# Patient Record
Sex: Male | Born: 1975 | Race: White | Hispanic: No | Smoking: Never smoker
Health system: Southern US, Community
[De-identification: ages and names within clinical notes are randomized; demographics above are authoritative.]

## PROBLEM LIST (undated history)

## (undated) DIAGNOSIS — G8929 Other chronic pain: Secondary | ICD-10-CM

## (undated) DIAGNOSIS — L97509 Non-pressure chronic ulcer of other part of unspecified foot with unspecified severity: Secondary | ICD-10-CM

## (undated) DIAGNOSIS — M549 Dorsalgia, unspecified: Secondary | ICD-10-CM

## (undated) DIAGNOSIS — M5416 Radiculopathy, lumbar region: Secondary | ICD-10-CM

## (undated) DIAGNOSIS — F32A Depression, unspecified: Secondary | ICD-10-CM

## (undated) DIAGNOSIS — I10 Essential (primary) hypertension: Secondary | ICD-10-CM

## (undated) DIAGNOSIS — M542 Cervicalgia: Secondary | ICD-10-CM

## (undated) DIAGNOSIS — E11621 Type 2 diabetes mellitus with foot ulcer: Secondary | ICD-10-CM

## (undated) DIAGNOSIS — Z765 Malingerer [conscious simulation]: Secondary | ICD-10-CM

## (undated) DIAGNOSIS — L03211 Cellulitis of face: Secondary | ICD-10-CM

## (undated) DIAGNOSIS — N529 Male erectile dysfunction, unspecified: Secondary | ICD-10-CM

## (undated) DIAGNOSIS — E785 Hyperlipidemia, unspecified: Secondary | ICD-10-CM

## (undated) DIAGNOSIS — F329 Major depressive disorder, single episode, unspecified: Secondary | ICD-10-CM

## (undated) DIAGNOSIS — F419 Anxiety disorder, unspecified: Secondary | ICD-10-CM

## (undated) DIAGNOSIS — K219 Gastro-esophageal reflux disease without esophagitis: Secondary | ICD-10-CM

## (undated) DIAGNOSIS — M5412 Radiculopathy, cervical region: Secondary | ICD-10-CM

## (undated) HISTORY — PX: KNEE SURGERY: SHX244

## (undated) HISTORY — PX: TONSILLECTOMY: SUR1361

## (undated) HISTORY — DX: Essential (primary) hypertension: I10

## (undated) HISTORY — DX: Major depressive disorder, single episode, unspecified: F32.9

## (undated) HISTORY — DX: Depression, unspecified: F32.A

## (undated) HISTORY — DX: Gastro-esophageal reflux disease without esophagitis: K21.9

## (undated) HISTORY — PX: BACK SURGERY: SHX140

## (undated) HISTORY — DX: Anxiety disorder, unspecified: F41.9

## (undated) HISTORY — DX: Hyperlipidemia, unspecified: E78.5

## (undated) HISTORY — DX: Male erectile dysfunction, unspecified: N52.9

## (undated) HISTORY — PX: CHOLECYSTECTOMY OPEN: SUR202

## (undated) HISTORY — DX: Other chronic pain: G89.29

---

## 2003-09-19 ENCOUNTER — Inpatient Hospital Stay (HOSPITAL_COMMUNITY): Admission: EM | Admit: 2003-09-19 | Discharge: 2003-09-23 | Payer: Self-pay | Admitting: Emergency Medicine

## 2007-03-24 ENCOUNTER — Emergency Department (HOSPITAL_COMMUNITY): Admission: EM | Admit: 2007-03-24 | Discharge: 2007-03-24 | Payer: Self-pay | Admitting: Emergency Medicine

## 2007-05-12 ENCOUNTER — Ambulatory Visit (HOSPITAL_COMMUNITY): Admission: RE | Admit: 2007-05-12 | Discharge: 2007-05-12 | Payer: Self-pay | Admitting: Orthopaedic Surgery

## 2007-06-25 HISTORY — PX: CHOLECYSTECTOMY: SHX55

## 2007-12-01 ENCOUNTER — Emergency Department (HOSPITAL_COMMUNITY): Admission: EM | Admit: 2007-12-01 | Discharge: 2007-12-01 | Payer: Self-pay | Admitting: Emergency Medicine

## 2008-03-27 ENCOUNTER — Emergency Department (HOSPITAL_COMMUNITY): Admission: EM | Admit: 2008-03-27 | Discharge: 2008-03-27 | Payer: Self-pay | Admitting: Emergency Medicine

## 2008-05-10 ENCOUNTER — Inpatient Hospital Stay (HOSPITAL_COMMUNITY): Admission: RE | Admit: 2008-05-10 | Discharge: 2008-05-12 | Payer: Self-pay | Admitting: Neurosurgery

## 2010-11-06 NOTE — Op Note (Signed)
NAMECARSIN, RANDAZZO NO.:  [id number]   MEDICAL RECORD NO.:  [id number]          PATIENT TYPE:  INP   LOCATION:  3004                         FACILITY:  [facility]   PHYSICIAN:  Payton Doughty, M.D.      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   PROCEDURE:  L3-L4 anterolateral interbody lumbar fusion with lateral  plate.   SURGEON:  Payton Doughty, MD   ANESTHESIA:  General endotracheal.   PREPARATION:  Prepped and draped with alcohol wipe.   COMPLICATIONS:  None.   NURSE ASSISTANT:  Dr. Basilia Jumbo.   DOCTOR'S ASSISTANT:  Hilda Lias, MD   BODY OF TEXT:  A 35 year old gentleman with severe spondylosis and  scoliosis at L3-L4, taken to operating room, smoothly anesthetized,  intubated, and placed laterally, right side down and left side up on the  operating table and taped securely.  Fluoroscopy was used to confirm  good location of the spine in the operative field.  Following shave,  prep, and drape in usual sterile fashion, 2 lateral incisions were made  after localization over the L3-L4 disk space, one was posterolateral and  one was straight lateral.  Working through the posterolateral incision,  the retroperitoneal space was accessed and digitally probed.  Working  straight laterally through the other incision, the fascia was breached  and the dilator probe was introduced.  Under fluoroscopic guidance, it  was guided down to the disk space using the neuromonitoring.  When the  neuromonitoring revealed that they were close to part of the lumbar  plexus, the route was slightly changed so that neuromonitoring revealed  safe passage to the iliopsoas muscle.  Following good docking with this,  a K-wire was tapped into the disk space and  fluoroscopically  demonstrated to be in good position.  Successive dilation under  neuromonitoring was carried out.  The retractor was then placed and  under neuromonitoring opened.  Under fluoroscopic guidance, the same was  tapped into the disk space.  The blades were further opened and the disk  uncovered.  Diskectomy was carried out using the blade, curettes, Cobb,  periosteal elevators, and Kerrison instrumentation.  Following complete  diskectomy, 8 mm and 10 mm x 18 x 55 mm trials were used and 10 was  decided to be the correct level.  The 10-mm implant was packed with BMP  product and tapped into place.  Fluoroscopic guidance showed good  placement.  The 60-mm screws were then placed in each  endplate and the lateral plate placed over those and tightened down with  the locking nuts.  Final film showed good placement of hardware.  The  two incisions were closed with successive layers of 2-0 Vicryl and 3-0  nylon.  Betadine and Telfa dressings were applied.  The patient returned  to the recovery room in good condition.  ______________________________  Payton Doughty, M.D.     MWR/MEDQ  D:  05/10/2008  T:  05/10/2008  Job:  [id number]

## 2010-11-06 NOTE — H&P (Signed)
NAMELEXUS, BARLETTA NO.:  [id number]   MEDICAL RECORD NO.:  [id number]          PATIENT TYPE:  INP   LOCATION:  NA                           FACILITY:  [facility]   PHYSICIAN:  Payton Doughty, M.D.      DATE OF BIRTH:  12/31/1975   DATE OF ADMISSION:  05/10/2008  DATE OF DISCHARGE:                              HISTORY & PHYSICAL   DIAGNOSIS:  Spondylosis at L3-4.   BODY OF THE TEXT:  This is a 35 year old right-handed white gentleman  who is followed in our office for about a year, had pain in his low back  pain, had been getting worse; and also right knee pain.  MRI of his  lumbar spine shows spondylosis with scoliosis centered at L3-4, it is  concave to the right.  He is admitted now for anterior lumbar inner body  fusion done from the lateral approach, i.e. an XLIF.   MEDICAL HISTORY:  Remarkable for hypertension and diabetes.   SURGICAL HISTORY:  Tonsillectomy and __________ removed from the right  leg in 2004.   ALLERGIES:  He has no allergies.   MEDICATIONS:  Paroxetine, lisinopril, aspirin, glipizide, metformin, and  Endocet.   SOCIAL HISTORY:  He is married.  Does not smoke.  Drinks socially.  He  is employed as a Arts development officer.   FAMILY HISTORY:  Noncontributory.   REVIEW OF SYSTEMS:  Remarkable for back and leg pain.   PHYSICAL EXAMINATION:  HEENT:  Within normal limits.  NECK:  He has reasonable range of motion of his neck.  CHEST:  Clear.  CARDIAC:  Regular rate and rhythm.  ABDOMEN:  Nontender.  No hepatosplenomegaly.  EXTREMITIES:  No clubbing or cyanosis.  GU:  Peripheral pulses are good.  NEUROLOGIC:  He is awake, alert, and oriented.  His cranial nerves are  intact.  Motor exam shows 5/5 strength throughout the upper and lower  extremities save for the right iliopsoas which may be a little bit weak  at 5-/5.  He has a slightly diminished sensation of the right L3 and L4  distribution.  Reflexes intact throughout.  MR demonstrates  extensive  degenerative change with focal scoliotic change at L3-4.   CLINICAL IMPRESSION:  Degenerative scoliosis at L3-4.   PLAN:  The plan is for an XLIF.  The risks and benefits have been  discussed with him, and he wished to proceed.           ______________________________  Payton Doughty, M.D.     MWR/MEDQ  D:  05/10/2008  T:  05/10/2008  Job:  [phone] [id number]

## 2010-11-06 NOTE — H&P (Signed)
NAME:  Christopher Raymond, Christopher Raymond               ACCOUNT NO.:  [id number]   MEDICAL RECORD NO.:  [id number]          PATIENT TYPE:  AMB   LOCATION:  DAY                           FACILITY:  [facility]   PHYSICIAN:  J. Darreld Mclean, M.D. DATE OF BIRTH:  February 03, 1976   DATE OF ADMISSION:  DATE OF DISCHARGE:  LH                              HISTORY & PHYSICAL   CHIEF COMPLAINT:  I have pain and tenderness in my right knee.   HISTORY OF PRESENT ILLNESS:  The patient is a 35 year old male with pain  and tenderness in his right knee for several months.  I saw him in the  office originally on October 6.  He had also had significant problems  and tenderness with his back.  I evaluated him for his back problem and  also for his right knee.  He was having giving-way of his knee, swelling  and tenderness.  He had an MRI of his back which showed advanced  degenerative joint disease and changes with right L3 foraminal  impingement of the nerve.  On his MRI of the knee, he had a tear of the  posterior horn of the medial meniscus with a displaced fragment.  I  explained the findings to him.  He was set up to see a neurosurgeon and  he saw Dr. Phoebe Perch at [facility] on October 22.  He subsequently got the  patient to have an epidural injection to his back, which the patient  says has not helped that much.  He is supposed to go back and see Dr.  Phoebe Perch in about 3 months.  This patient's knee continues to bother him,  is giving way, swelling and it has not improved with conservative  treatment.  I have recommended arthroscopy for the knee.  Risks and  imponderables of the knee surgery have been explained to the patient and  his wife and they appeared to understand.   PAST HISTORY:  Positive for diabetes.  Otherwise, he denies heart  disease, lung disease, kidney disease, stroke, paralysis, hypertension,  TB, cancer, ulcer disease or circulatory problems.   ALLERGIES:  He has no allergies.   MEDICATIONS:  He has been  taking:  1. Percocet for pain 7.5/325 mg .  2. Lisinopril /hydrochlorothiazide 20/25 daily.  3. Glipizide daily.  4. Metformin 500 mg daily.  5. One aspirin a day.   SOCIAL HISTORY:  He does not smoke.  He uses alcoholic beverages  socially.  He is Engineer, maintenance (IT).  The patient is married and he lives  in [place].   PRIMARY CARE DOCTOR:  Dr. Loney Hering is his family doctor.   PAST SURGICAL HISTORY:  He is status post tonsillectomy and surgery for  a boil from his right thigh.   FAMILY HISTORY:  He denies any diseases that run in the family.   PHYSICAL EXAMINATION:  VITAL SIGNS:  BP 130/86, pulse 72, respirations  16, afebrile, 6-feet tall and weighs 251 pounds.  GENERAL:  He is alert, cooperative and oriented.  HEENT:  Negative.  NECK:  Supple.  LUNGS:  Clear to P&A.  HEART:  Regular rhythm without murmur heard.  ABDOMEN:  Soft and nontender without masses.  BACK:  Tender and he has some pain and tenderness in his right lower  back and limited range of motion.  Gait is slow.  He has pain and  tenderness in the right knee with crepitus pain, pain and tenderness  along the medial joint line and a positive McMurray medially on the  right knee; the left knee is negative.  Straight leg raises are negative  and reflexes are bilaterally equal.  CNS:  Intact.  SKIN:  Intact.   IMPRESSION:  1. Medial meniscal tear of the right knee.  2. L3 nerve involvement and impingement on the right with chronic low      back pain.  3. Diabetes, orally controlled.   PLAN:  Arthroscopy of the right knee.  Labs are pending.  This will be  done as an outpatient procedure.                                            ______________________________  J. Darreld Mclean, M.D.     JWK/MEDQ  D:  05/07/2007  T:  05/07/2007  Job:  [id number]

## 2010-11-06 NOTE — Op Note (Signed)
NAME:  Christopher Raymond, Christopher Raymond               ACCOUNT NO.:  [id number]   MEDICAL RECORD NO.:  [id number]          PATIENT TYPE:  AMB   LOCATION:  DAY                           FACILITY:  [facility]   PHYSICIAN:  J. Darreld Mclean, M.D. DATE OF BIRTH:  July 14, 1975   DATE OF PROCEDURE:  05/12/2007  DATE OF DISCHARGE:                               OPERATIVE REPORT   PREOPERATIVE DIAGNOSIS:  Tear of medial meniscus of the right knee.   POSTOPERATIVE DIAGNOSIS:  Tear of medial meniscus of the right knee.   PROCEDURE:  Operative arthroscopy, partial medial meniscectomy.   ANESTHESIA:  General.   TOURNIQUET TIME:  15 minutes.   SURGEON:  J. Darreld Mclean, M.D.   DRAINS:  No drains.   INDICATIONS:  The patient is a 35 year old male with pain and tenderness  in his knee on the right, giving way, pain.  He also has history of  significant low back pain.  He has seen a Midwife.  He has a disk  in his back and talking about epidurals and possible surgery.  His knee  continues to give way, continues to be a problem and now while he is  between epidural injections, he elects to get the meniscus removed.  I  have had several discussions with the patient and his wife concerning  knee arthroscopy, risks and imponderables.  He appears to understand and  agreed to the procedure as outlined.   DESCRIPTION OF PROCEDURE:  The patient was seen in the holding area and  the right knee identified as correct surgical site.  He placed a mark  the right knee and I placed a mark the right knee.  I talked to his  wife.  The patient was then brought the operating room, placed supine.  He was given general anesthetic.  Tourniquet and legholder placed,  deflated on right upper thigh.  The patient prepped and draped in usual  manner.  Had a time-out identifying [name] as the patient, the right  knee as correct surgical site.  Leg was elevated, wrapped  circumferentially with an Esmarch bandage.  Tourniquet inflated to  300  mmHg.  Esmarch bandage removed.  Inflow cannula inserted medially,  lactated Ringer's instilled into knee by use of an infusion pump.  All  scope inserted laterally, knee systematically examined.  Permanent  pictures were taken.  Basically the knee looked very good except for  tear in the posterior horn of the medial meniscus which was loose on the  furthest posterior area.  I was able to bring it forward and flip it  back and forth.  Using a meniscal punch, I was able to bring out the  fragments in two large pieces.  These were removed with grasping  forceps.  Rest of the meniscal area was smoothed down using a meniscal  shaver.  Good contour was obtained.  Knee was systematically reexamined  and no pathology found.  Knee was irrigated with remaining part of  lactated Ringer's.  Wound was reapproximated using 3-0 nylon interrupted  vertical mattress  manner.  The patient given prescription for Percocet  for pain because of  his significant back pain and also because of the knee surgery.  Precautions have been given. Will see him in the office approximately 10  days 2 weeks.  Physical therapy has been arranged.  Any difficulties  contact me through the office hospital beeper system.           ______________________________  Shela Commons. Darreld Mclean, M.D.     JWK/MEDQ  D:  05/12/2007  T:  05/12/2007  Job:  [id number]

## 2010-11-09 NOTE — Discharge Summary (Signed)
NAME:  Christopher Raymond, Christopher Raymond                         ACCOUNT NO.:  [id number]   MEDICAL RECORD NO.:  [id number]                   PATIENT TYPE:  INP   LOCATION:  A210                                 FACILITY:  [facility]   PHYSICIAN:  Vania Rea, M.D.              DATE OF BIRTH:  06-12-1976   DATE OF ADMISSION:  09/19/2003  DATE OF DISCHARGE:  09/23/2003                                 DISCHARGE SUMMARY   PRIMARY CARE PHYSICIAN:  Unassigned.   DISCHARGE DIAGNOSES:  1. Diabetes mellitus uncontrolled.  2. Abscess right upper inner thigh, status post incision and drainage.   DISPOSITION:  Discharged to home.   DISCHARGE CONDITION:  Stable.   DISCHARGE MEDICATIONS:  1. Lantus insulin 65 units at bedtime.  2. Humalog 20 units three times daily with meals.  3. Doxycycline 100 mg twice daily, 25 tablets prescribed.   HOSPITAL COURSE:  Please refer to H&P of March 28.  This is a morbidly  obese, 35 year old Caucasian male diagnosed with borderline diabetes 2 years  ago who went on a dietary regimen and reduced his weight from 375 pounds to  250 pounds.  The patient had been in fairly good health until he noticed  swelling in his right proximal medial thigh.  The patient came to the  emergency room because of difficulty walking and was found to have a blood  sugar of over 500, and an abscess on the right upper thigh. The patient was  admitted for I&D and control his blood sugar.   I&D was undertaken without difficulty; however, the patient was, at first,  resistant to injecting himself with insulin, some attempt was made to train  him in the use of an insulin pen.  The patient's life style is such that a  70/30 pen would be difficult to control his glucose.  The patient works at  night and keeps irregular hours.  It was decided that Lantus and Humalog  would be the best regimen for control, but such pens are not available.  Eventually the patient was persuaded to inject himself with Lantus  and  Humalog and to be sent to a primary care physician who would arrange for  Lantus pen to be supplied by a drug rep.   Today, the patient remains alert and oriented without any difficulty.  He  has never had a white count, never had a fever.  His fasting blood sugar  this morning was 138.  His bedtime fingerstick was 183.  The patient may  possibly benefit from an increase in the Lantus dose, but we will leave it  to his primary care physician to make adjustments.   FOLLOW UP:  1. With primary care physician Dr. Janna Arch.  2. The patient is to have further nutrition counseling and further home     health and further diabetic teaching.  3. The patient is also to have home health follow up to  be sure that he is     using his Glucometer and finger sticks and screening blood sugars     properly.   SPECIAL INSTRUCTIONS:  The patient is advised to continue on his diet to  loose weight, to check his blood sugars before each meal, fasting in the  morning and at bedtime for at least 2 weeks and to take this schedule to his  doctor.  The patient has been advised on symptoms of hypoglycemia and the  remedies for this.     ___________________________________________                                         Vania Rea, M.D.   LC/MEDQ  D:  09/23/2003  T:  09/23/2003  Job:  [id number]

## 2010-11-09 NOTE — Op Note (Signed)
NAME:  [name], [name]                         ACCOUNT NO.:  [id number]   MEDICAL RECORD NO.:  [id number]                   PATIENT TYPE:  INP   LOCATION:  A210                                 FACILITY:  [facility]   PHYSICIAN:  Dirk Dress. Katrinka Blazing, M.D.                DATE OF BIRTH:  [birth year]   DATE OF PROCEDURE:  09/20/2003  DATE OF DISCHARGE:                                 OPERATIVE REPORT   PREOPERATIVE DIAGNOSIS:  Major abscess, right medial thigh.   POSTOPERATIVE DIAGNOSIS:  Major abscess, right medial thigh.   OPERATION/PROCEDURE:  Wide excision of major abscess, right medial thigh.   SURGEON:  Dirk Dress. Katrinka Blazing, M.D.   DESCRIPTION OF PROCEDURE:  Under general LMA anesthesia, the patient was  placed in the lithotomy position.  The right thigh and genitalia were  prepped and draped in a sterile field.  Wide excision was carried around the  dome of the large abscess cavity.  It was extended through the superficial  to the deep subcutaneous tissue.  Central portion of the cavity was  necrotic.  On the central portion of the cavity, the excision went down to  the muscle and fascia.  Hemostasis was achieved.  Cultures were taken.  The  wound was packed with Betadine-soaked gauze.  Sterile dressing was applied.  The patient was awakened from anesthesia without difficulty.  He was  transferred to the post anesthesia care unit for further monitoring.      ___________________________________________                                            Dirk Dress. Katrinka Blazing, M.D.   LCS/MEDQ  D:  09/20/2003  T:  09/20/2003  Job:  [id number]   cc:   Vania Rea, M.D.

## 2010-11-09 NOTE — H&P (Signed)
NAME:  Christopher Raymond, Christopher Raymond NO.:  [id number]   MEDICAL RECORD NO.:  [id number]                   PATIENT TYPE:  EMS   LOCATION:  ED                                   FACILITY:  [facility]   PHYSICIAN:  Vania Rea, M.D.              DATE OF BIRTH:  11-10-1975   DATE OF ADMISSION:  09/19/2003  DATE OF DISCHARGE:                                HISTORY & PHYSICAL   PRIMARY CARE PHYSICIAN:  Unassigned.   CHIEF COMPLAINT:  Swelling of an abscess of the right thigh for 3 days.   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  The patient noted no fever, no nausea, vomiting, or diarrhea  but has been experiencing extreme thirst although he denies polyuria.  As  the swelling became more painful patient decided to come to the emergency  room where he was evaluated and found to have a blood sugar of 500.   Patient denies headaches, dizziness, seizures, or syncope.  Patient denies  nausea, vomiting, diarrhea, or constipation.  Patient denies chest pain,  shortness of breath, fever, cough, or cold.  Patient denies joint pains or  back pains but does have difficulty ambulating because of painful swelling  in the inner thigh/perineal area.   Does have a 2-year history of diabetes but no thyroid or other endocrine  problems.  No dysuria, hematuria or frequency or polyuria noted.   PAST MEDICAL HISTORY:  1. Diabetes mellitus.  2. Obesity.   MEDICATIONS:  Multivitamins.   ALLERGIES:  No known drug allergies.   SOCIAL HISTORY:  Never smoked.  Drinks  alcohol maybe three to four beers  once or twice per month.  Denies any illicit drug use.  Was married 2 years  ago.  Lives with his wife.  No children but his wife's two stepchildren live  with him.   FAMILY HISTORY:  His father also had diabetes and died of an acute MI at the  age of 19.  His mother is 28 years old.  She has a cardiac arrhythmia and  hypertension.  He has no siblings.   REVIEW OF SYSTEMS:  See H&P.   PHYSICAL EXAMINATION:  VITALS:  Temperature 98.8, blood pressure 158/82,  pulse of 137, respirations 20.  HEENT:  Pink and anicteric.  His pupils are equal and reactive.  There is no  jugular venous distention.  There is no lymphadenopathy.  He does have  a  thick neck.  CHEST:  Clear to auscultation bilaterally.  CARDIOVASCULAR SYSTEM:  Regular rhythm; no murmurs.  ABDOMEN:  Obese, soft, nontender; there is no organomegaly.  EXTREMITIES:  Pulses are 3+ bilaterally; there is no edema.  He has an 8 cm  x 10 cm swelling in the upper inner thigh near the perineal area, it is very  tender but not red.  CNS:  He is alert and oriented x3 and he has no focal deficits.   LABORATORY VALUES:  CBC:  His white count is 6.5, hematocrit 41, MCV 81,  MCHC 35, RDW 12.9, platelets 251, neutrophils 73%.  His sodium is 131,  potassium 3.6, chloride 95, CO2 28, glucose 500, BUN 8, creatinine 1,  calcium 8.9.  No urinalysis has been done at this time.  No chest x-ray has  been done at this time.   ASSESSMENT:  1. Acute swelling of the right inner thigh probably an abscess despite an     absence of leukocytosis or fever.  2. Diabetes mellitus, uncontrolled.  3. Elevated blood pressure.   PLAN:  We will get a surgical consult for incision and drainage of presumed  abscess but we will start him simply on Unasyn 3 g IV every 6 hours.  For  his diabetes we will hydrate him vigorously bolus with normal saline and  then start normal saline with 20 of potassium at 20 mL/hr and we will put   him on a glucose drip to aim for a blood sugar between 150 and 200.  We will  observe his blood pressure, if it continues to be elevated we will start him  on antihypertensive.  Once his blood sugar is better controlled we will  assess for other cardiovascular risk factors.     ___________________________________________                                         Vania Rea, M.D.   LC/MEDQ  D:  09/19/2003  T:  09/19/2003  Job:  [id number]

## 2011-01-21 ENCOUNTER — Ambulatory Visit: Payer: Self-pay | Admitting: Urgent Care

## 2011-01-21 ENCOUNTER — Telehealth: Payer: Self-pay | Admitting: Urgent Care

## 2011-01-22 ENCOUNTER — Encounter: Payer: Self-pay | Admitting: Urgent Care

## 2011-01-22 ENCOUNTER — Ambulatory Visit (INDEPENDENT_AMBULATORY_CARE_PROVIDER_SITE_OTHER): Payer: Medicare Other | Admitting: Urgent Care

## 2011-01-22 VITALS — BP 143/90 | HR 107 | Temp 97.6°F | Ht 73.0 in | Wt 288.0 lb

## 2011-01-22 DIAGNOSIS — K625 Hemorrhage of anus and rectum: Secondary | ICD-10-CM

## 2011-01-22 DIAGNOSIS — K219 Gastro-esophageal reflux disease without esophagitis: Secondary | ICD-10-CM

## 2011-01-22 DIAGNOSIS — R1110 Vomiting, unspecified: Secondary | ICD-10-CM

## 2011-01-22 NOTE — Assessment & Plan Note (Addendum)
Well controlled heartburn & indigestion on PPI.  See Nausea & vomiting.    Continue nexium 40mg  daily.

## 2011-01-22 NOTE — Progress Notes (Signed)
Referring Provider: Ardyth Gal, MD Primary Care Physician:  Harlow Asa, MD, MD Primary Gastroenterologist:  Dr. Darrick Penna  Chief Complaint  Patient presents with  . Rectal Bleeding  . Abdominal Pain    left side  . Emesis    HPI:  Christopher Raymond is a 35 y.o. male here as a referral from Dr. Gerda Diss for 3 day hx of heavy rectal bleeding w/ loose BM a couple weeks ago.  BM 1-2 per day now.  Denies constipation or diarrhea.  C/o LLQ pain that is intermittent.  Also c/o chronic back pain.  Denies NSAIDs.  C/o nausea & vomiting 2-3 times per week x 3 yrs since before cholecystectomy for biliary dyskinesia.  Denies fever or chills.  Nausea worse 1st thing in morning.  On omeprazole 20mg  daily x 3 yr & denies any breakthrough heartburn or indigestion. EGD Dr. Cleotis Nipper 3 yrs ago-hernia?   Past Medical History  Diagnosis Date  . GERD (gastroesophageal reflux disease)   . Diabetes mellitus   . HTN (hypertension)   . Depression   . Anxiety    Past Surgical History  Procedure Date  . Knee surgery   . Back surgery   . Cholecystectomy 2009  . Tonsillectomy    Current Outpatient Prescriptions  Medication Sig Dispense Refill  . escitalopram (LEXAPRO) 10 MG tablet Take 10 mg by mouth daily.        Marland Kitchen esomeprazole (NEXIUM) 40 MG capsule Take 40 mg by mouth daily before breakfast.        . insulin NPH-insulin regular (NOVOLIN 70/30) (70-30) 100 UNIT/ML injection Inject 70 Units into the skin.        Marland Kitchen lisinopril (PRINIVIL,ZESTRIL) 10 MG tablet Take 10 mg by mouth daily.        Marland Kitchen oxyCODONE-acetaminophen (PERCOCET) 10-325 MG per tablet Take 1 tablet by mouth every 4 (four) hours as needed.        . Pioglitazone HCl-Metformin HCl (ACTOPLUS MET PO) Take 75 mg by mouth.         Allergies as of 01/22/2011  . (No Known Allergies)   Family History:There is no known family history of colorectal carcinoma , liver disease, or inflammatory bowel disease.  Problem Relation Age of Onset  . Coronary artery  disease Father    History   Social History  . Marital Status: Married    Spouse Name: N/A    Number of Children: 0  . Years of Education: N/A   Occupational History  . disabled     previously warehouse work   Social History Main Topics  . Smoking status: Never Smoker   . Smokeless tobacco: Current User   Comment: dip x 20  . Alcohol Use: Yes     very little 4-5 beers/yr  . Drug Use: No  . Sexually Active: Not on file  Review of Systems: Gen: See HPI CV: Denies chest pain, angina, palpitations, syncope, orthopnea, PND, peripheral edema, and claudication. Resp: Denies dyspnea at rest, dyspnea with exercise, cough, sputum, wheezing, coughing up blood, and pleurisy. GI: Denies vomiting blood, jaundice, and fecal incontinence.   Denies dysphagia or odynophagia. GU : Denies urinary burning, blood in urine, urinary frequency, urinary hesitancy, nocturnal urination, and urinary incontinence. MS: Denies joint pain, limitation of movement, and swelling, stiffness, low back pain, extremity pain. Denies muscle weakness, cramps, atrophy.  Derm: Denies rash, itching, dry skin, hives, moles, warts, or unhealing ulcers.  Psych: Denies depression, anxiety, memory loss, suicidal ideation, hallucinations, paranoia, and  confusion. Heme: Denies bruising, bleeding, and enlarged lymph nodes.  Physical Exam: BP 143/90  Pulse 107  Temp(Src) 97.6 F (36.4 C) (Temporal)  Ht 6\' 1"  (1.854 m)  Wt 288 lb (130.636 kg)  BMI 38.00 kg/m2 General:   Alert,  Well-developed, well-nourished, pleasant and cooperative in NAD Head:  Normocephalic and atraumatic. Eyes:  Sclera clear, no icterus.   Conjunctiva pink. Ears:  Normal auditory acuity. Nose:  No deformity, discharge,  or lesions. Mouth:  No deformity or lesions, dentition normal. Neck:  Supple; no masses or thyromegaly. Lungs:  Clear throughout to auscultation.   No wheezes, crackles, or rhonchi. No acute distress. Heart:  Regular rate and rhythm; no  murmurs, clicks, rubs,  or gallops. Abdomen:  Soft, obese, nontender and nondistended. No masses, hepatosplenomegaly or hernias noted. Normal bowel sounds, without guarding, and without rebound.   Rectal:  Deferred until time of colonoscopy.   Msk:  Symmetrical without gross deformities. Normal posture. Pulses:  Normal pulses noted. Extremities:  Without clubbing or edema. Neurologic:  Alert and  oriented x4;  grossly normal neurologically. Skin:  Intact without significant lesions or rashes. Cervical Nodes:  No significant cervical adenopathy. Psych:  Alert and cooperative. Normal mood and affect.

## 2011-01-22 NOTE — Assessment & Plan Note (Signed)
Christopher Raymond is a 35 y.o. caucasian male w/ heavy rectal bleeding for several days.  He had labs drawn this morning through Dr Christopher Raymond order & we will request.  Differentials for his rectal bleeding include ischemia, diverticular bleeding, colorectal polyp or cancer, inflammatory bowel disease, benign anorectal source or rapid transit upper GI source such as PUD.  Colonoscopy +/- EGD in near future.  I have discussed risks & benefits which include, but are not limited to, bleeding, infection, perforation & drug reaction.  The patient agrees with this plan & written consent will be obtained.    Follow up labs drawn today thru Dr Christopher Raymond office To ER if severe pain or bleeding    FU

## 2011-01-22 NOTE — Assessment & Plan Note (Signed)
Intermittent nausea & vomiting for several yrs since cholecystectomy.  Previous EGD elsewhere reportedly benign.  Consider EGD if source of GI bleed not obvious.  I have discussed risks & benefits which include, but are not limited to, bleeding, infection, perforation & drug reaction.  The patient agrees with this plan & written consent will be obtained.  Differentials include PUD, gastroparesis, gastritis or poorly controlled GERD, hiatal hernia or cyclic vomiting syndrome.

## 2011-01-23 ENCOUNTER — Telehealth: Payer: Self-pay | Admitting: General Practice

## 2011-01-23 NOTE — Telephone Encounter (Signed)
I called pt and made pt aware of the med adjustments. Pt voiced understanding.

## 2011-01-23 NOTE — Telephone Encounter (Signed)
Message copied by Jennings Books on Wed Jan 23, 2011  9:38 AM ------      Message from: Joselyn Arrow      Created: Tue Jan 22, 2011  3:44 PM      Regarding: RE: I need diabetic medication adjustments for procedure.       Take 1/2 insulin or 35 units day before procedure.      Hold insulin day of procedure & bring to hospital.      ----- Message -----         From: Ave Filter         Sent: 01/22/2011   2:12 PM           To: Lorenza Burton, NP      Subject: I need diabetic medication adjustments for p#

## 2011-01-23 NOTE — Progress Notes (Signed)
Cc to PCP 

## 2011-01-23 NOTE — Telephone Encounter (Signed)
Routed to provider

## 2011-01-31 NOTE — Progress Notes (Signed)
TCS: BRBPR, EGD: NV  MAY NEED PHENERGAN-BMI >35, uses chronic narcotics/Lexapro

## 2011-02-18 MED ORDER — SODIUM CHLORIDE 0.45 % IV SOLN: Freq: Once | INTRAVENOUS | Status: DC

## 2011-02-19 ENCOUNTER — Encounter: Payer: Medicare Other | Admitting: Gastroenterology

## 2011-02-19 ENCOUNTER — Encounter (HOSPITAL_COMMUNITY): Admission: RE | Payer: Self-pay | Source: Ambulatory Visit

## 2011-02-19 ENCOUNTER — Ambulatory Visit (HOSPITAL_COMMUNITY): Admission: RE | Admit: 2011-02-19 | Payer: Medicare Other | Source: Ambulatory Visit | Admitting: Gastroenterology

## 2011-02-19 SURGERY — COLONOSCOPY
Anesthesia: Moderate Sedation

## 2011-02-19 NOTE — H&P (Signed)
Reason for Visit     Rectal Bleeding    Abdominal Pain    left side    Emesis        Current Vitals       Recorded User        01/22/2011 10:57 AM  Ginger L Walker, NT           BP Pulse Temp (Src) Resp Ht Wt    143/90  107  97.6 F (36.4 C) (Temporal)  N/A  6\' 1"  (1.854 m)  288 lb (130.636 kg)       BMI SpO2 PF    38.00 kg/m2  N/A  N/A          Progress Notes     Lorenza Burton, NP  01/22/2011  5:24 PM  Signed Referring Provider: Ardyth Gal, MD Primary Care Physician:  Harlow Asa, MD, MD Primary Gastroenterologist:  Dr. Darrick Penna    Chief Complaint   Patient presents with   .  Rectal Bleeding   .  Abdominal Pain       left side   .  Emesis      HPI:  Christopher Raymond is a 35 y.o. male here as a referral from Dr. Gerda Diss for 3 day hx of heavy rectal bleeding w/ loose BM a couple weeks ago.  BM 1-2 per day now.  Denies constipation or diarrhea.  C/o LLQ pain that is intermittent.  Also c/o chronic back pain.  Denies NSAIDs.  C/o nausea & vomiting 2-3 times per week x 3 yrs since before cholecystectomy for biliary dyskinesia.  Denies fever or chills.  Nausea worse 1st thing in morning.  On omeprazole 20mg  daily x 3 yr & denies any breakthrough heartburn or indigestion. EGD Dr. Cleotis Nipper 3 yrs ago-hernia?    Past Medical History   Diagnosis  Date   .  GERD (gastroesophageal reflux disease)     .  Diabetes mellitus     .  HTN (hypertension)     .  Depression     .  Anxiety      Past Surgical History   Procedure  Date   .  Knee surgery     .  Back surgery     .  Cholecystectomy  2009   .  Tonsillectomy      Current Outpatient Prescriptions   Medication  Sig  Dispense  Refill   .  escitalopram (LEXAPRO) 10 MG tablet  Take 10 mg by mouth daily.           Marland Kitchen  esomeprazole (NEXIUM) 40 MG capsule  Take 40 mg by mouth daily before breakfast.           .  insulin NPH-insulin regular (NOVOLIN 70/30) (70-30) 100 UNIT/ML injection  Inject 70 Units into the skin.            Marland Kitchen  lisinopril (PRINIVIL,ZESTRIL) 10 MG tablet  Take 10 mg by mouth daily.           Marland Kitchen  oxyCODONE-acetaminophen (PERCOCET) 10-325 MG per tablet  Take 1 tablet by mouth every 4 (four) hours as needed.           .  Pioglitazone HCl-Metformin HCl (ACTOPLUS MET PO)  Take 75 mg by mouth.            Allergies as of 01/22/2011   .  (No Known Allergies)    Family History:There is no known family history of colorectal  carcinoma , liver disease, or inflammatory bowel disease.   Problem  Relation  Age of Onset   .  Coronary artery disease  Father         History       Social History   .  Marital Status:  Married       Spouse Name:  N/A       Number of Children:  0   .  Years of Education:  N/A       Occupational History   .  disabled         previously warehouse work       Social History Main Topics   .  Smoking status:  Never Smoker    .  Smokeless tobacco:  Current User     Comment: dip x 20   .  Alcohol Use:  Yes         very little 4-5 beers/yr   .  Drug Use:  No   .  Sexually Active:  Not on file    Review of Systems: Gen: See HPI CV: Denies chest pain, angina, palpitations, syncope, orthopnea, PND, peripheral edema, and claudication. Resp: Denies dyspnea at rest, dyspnea with exercise, cough, sputum, wheezing, coughing up blood, and pleurisy. GI: Denies vomiting blood, jaundice, and fecal incontinence.   Denies dysphagia or odynophagia. GU : Denies urinary burning, blood in urine, urinary frequency, urinary hesitancy, nocturnal urination, and urinary incontinence. MS: Denies joint pain, limitation of movement, and swelling, stiffness, low back pain, extremity pain. Denies muscle weakness, cramps, atrophy.   Derm: Denies rash, itching, dry skin, hives, moles, warts, or unhealing ulcers.   Psych: Denies depression, anxiety, memory loss, suicidal ideation, hallucinations, paranoia, and confusion. Heme: Denies bruising, bleeding, and enlarged lymph nodes.   Physical  Exam: BP 143/90  Pulse 107  Temp(Src) 97.6 F (36.4 C) (Temporal)  Ht 6\' 1"  (1.854 m)  Wt 288 lb (130.636 kg)  BMI 38.00 kg/m2 General:   Alert,  Well-developed, well-nourished, pleasant and cooperative in NAD Head:  Normocephalic and atraumatic. Eyes:  Sclera clear, no icterus.   Conjunctiva pink. Ears:  Normal auditory acuity. Nose:  No deformity, discharge,  or lesions. Mouth:  No deformity or lesions, dentition normal. Neck:  Supple; no masses or thyromegaly. Lungs:  Clear throughout to auscultation.   No wheezes, crackles, or rhonchi. No acute distress. Heart:  Regular rate and rhythm; no murmurs, clicks, rubs,  or gallops. Abdomen:  Soft, obese, nontender and nondistended. No masses, hepatosplenomegaly or hernias noted. Normal bowel sounds, without guarding, and without rebound.    Rectal:  Deferred until time of colonoscopy.    Msk:  Symmetrical without gross deformities. Normal posture. Pulses:  Normal pulses noted. Extremities:  Without clubbing or edema. Neurologic:  Alert and  oriented x4;  grossly normal neurologically. Skin:  Intact without significant lesions or rashes. Cervical Nodes:  No significant cervical adenopathy. Psych:  Alert and cooperative. Normal mood and affect.     Glendora Score  01/23/2011  9:59 AM  Signed Cc to PCP  [name], MD  01/31/2011  1:40 PM  Signed TCS: BRBPR, EGD: NV  MAY NEED PHENERGAN-BMI >35, uses chronic narcotics/Lexapro        Rectal bleed Lorenza Burton, NP  01/22/2011  5:21 PM  Signed Christopher Raymond is a 35 y.o. caucasian male w/ heavy rectal bleeding for several days.  He had labs drawn this morning through Dr Fletcher Anon order & we will  request.  Differentials for his rectal bleeding include ischemia, diverticular bleeding, colorectal polyp or cancer, inflammatory bowel disease, benign anorectal source or rapid transit upper GI source such as PUD.  Colonoscopy +/- EGD in near future.  I have discussed risks & benefits which  include, but are not limited to, bleeding, infection, perforation & drug reaction.  The patient agrees with this plan & written consent will be obtained.     Follow up labs drawn today thru Dr Fletcher Anon office To ER if severe pain or bleeding       FU  GERD (gastroesophageal reflux disease) - Lorenza Burton, NP  01/22/2011  5:22 PM  Addendum Well controlled heartburn & indigestion on PPI.  See Nausea & vomiting.     Continue nexium 40mg  daily.

## 2011-02-19 NOTE — Interval H&P Note (Signed)
History and Physical Interval Note:   02/19/2011   7:54 AM   Christopher Raymond  has presented today for surgery, with the diagnosis of rectal bleeding, chronic gerd  The various methods of treatment have been discussed with the patient and family. After consideration of risks, benefits and other options for treatment, the patient has consented to  Procedure(s): COLONOSCOPY ESOPHAGOGASTRODUODENOSCOPY (EGD) as a surgical intervention .  I have reviewed the patients' chart and labs.  Questions were answered to the patient's satisfaction.     [name]  MD

## 2011-03-26 LAB — GLUCOSE, CAPILLARY
Glucose-Capillary: 158 — ABNORMAL HIGH
Glucose-Capillary: 195 — ABNORMAL HIGH
Glucose-Capillary: 207 — ABNORMAL HIGH
Glucose-Capillary: 237 — ABNORMAL HIGH
Glucose-Capillary: 238 — ABNORMAL HIGH
Glucose-Capillary: 300 — ABNORMAL HIGH

## 2011-03-26 LAB — URINE MICROSCOPIC-ADD ON

## 2011-03-26 LAB — PROTIME-INR: Prothrombin Time: 12

## 2011-03-26 LAB — URINALYSIS, ROUTINE W REFLEX MICROSCOPIC
Glucose, UA: >1000 — AB
Hgb urine dipstick: NEGATIVE
Leukocytes, UA: NEGATIVE
Protein, ur: NEGATIVE
pH: 5

## 2011-03-26 LAB — COMPREHENSIVE METABOLIC PANEL
ALT: 24
AST: 24
Albumin: 4.2
CO2: 26
Calcium: 9.6
Chloride: 99
GFR calc Af Amer: >60
GFR calc non Af Amer: >60
Sodium: 134 — ABNORMAL LOW
Total Bilirubin: 0.7

## 2011-03-26 LAB — DIFFERENTIAL
Eosinophils Absolute: 0.2
Eosinophils Relative: 4
Lymphs Abs: 1.1
Monocytes Absolute: 0.3

## 2011-03-26 LAB — CBC
RBC: 5.2
WBC: 3.6 — ABNORMAL LOW

## 2011-03-26 LAB — TYPE AND SCREEN
ABO/RH(D): A POS
Antibody Screen: NEGATIVE

## 2011-04-02 LAB — URINALYSIS, ROUTINE W REFLEX MICROSCOPIC
Glucose, UA: >1000 — AB
Ketones, ur: NEGATIVE
Leukocytes, UA: NEGATIVE
Specific Gravity, Urine: >1.03 — ABNORMAL HIGH
pH: 5

## 2011-04-02 LAB — CBC
HCT: 43.4
Hemoglobin: 14.7
Platelets: 284
WBC: 5.7

## 2011-04-02 LAB — BASIC METABOLIC PANEL
GFR calc non Af Amer: >60
Potassium: 3.9
Sodium: 138

## 2011-04-02 LAB — DIFFERENTIAL
Eosinophils Relative: 1
Lymphocytes Relative: 23
Lymphs Abs: 1.3
Monocytes Absolute: 0.4
Neutro Abs: 4

## 2011-04-02 LAB — URINE MICROSCOPIC-ADD ON

## 2012-07-12 ENCOUNTER — Emergency Department (HOSPITAL_COMMUNITY): Payer: Medicare Other

## 2012-07-12 ENCOUNTER — Emergency Department (HOSPITAL_COMMUNITY)
Admission: EM | Admit: 2012-07-12 | Discharge: 2012-07-12 | Disposition: A | Discharge status: Home / Self Care | Payer: Medicare Other | Attending: Emergency Medicine | Admitting: Emergency Medicine

## 2012-07-12 ENCOUNTER — Encounter (HOSPITAL_COMMUNITY): Payer: Self-pay | Admitting: *Deleted

## 2012-07-12 DIAGNOSIS — K219 Gastro-esophageal reflux disease without esophagitis: Secondary | ICD-10-CM | POA: Insufficient documentation

## 2012-07-12 DIAGNOSIS — I10 Essential (primary) hypertension: Secondary | ICD-10-CM | POA: Insufficient documentation

## 2012-07-12 DIAGNOSIS — F411 Generalized anxiety disorder: Secondary | ICD-10-CM | POA: Insufficient documentation

## 2012-07-12 DIAGNOSIS — E119 Type 2 diabetes mellitus without complications: Secondary | ICD-10-CM | POA: Insufficient documentation

## 2012-07-12 DIAGNOSIS — Z794 Long term (current) use of insulin: Secondary | ICD-10-CM | POA: Insufficient documentation

## 2012-07-12 DIAGNOSIS — Z79899 Other long term (current) drug therapy: Secondary | ICD-10-CM | POA: Insufficient documentation

## 2012-07-12 DIAGNOSIS — R51 Headache: Secondary | ICD-10-CM | POA: Insufficient documentation

## 2012-07-12 DIAGNOSIS — R05 Cough: Secondary | ICD-10-CM | POA: Insufficient documentation

## 2012-07-12 DIAGNOSIS — Z8659 Personal history of other mental and behavioral disorders: Secondary | ICD-10-CM | POA: Insufficient documentation

## 2012-07-12 DIAGNOSIS — H9319 Tinnitus, unspecified ear: Secondary | ICD-10-CM | POA: Insufficient documentation

## 2012-07-12 DIAGNOSIS — R059 Cough, unspecified: Secondary | ICD-10-CM | POA: Insufficient documentation

## 2012-07-12 MED ORDER — PROMETHAZINE HCL 12.5 MG PO TABS
25.0000 mg | ORAL_TABLET | Freq: Once | ORAL | Status: AC
  Administered 2012-07-12: 25 mg via ORAL
  Filled 2012-07-12: qty 2

## 2012-07-12 MED ORDER — OXYCODONE-ACETAMINOPHEN 5-325 MG PO TABS: 1.0000 | ORAL_TABLET | Freq: Four times a day (QID) | ORAL | Status: AC | PRN

## 2012-07-12 MED ORDER — OXYCODONE-ACETAMINOPHEN 5-325 MG PO TABS
1.0000 | ORAL_TABLET | Freq: Once | ORAL | Status: AC
  Administered 2012-07-12: 1 via ORAL
  Filled 2012-07-12: qty 1

## 2012-07-12 NOTE — ED Notes (Signed)
Patient with no complaints at this time. Respirations even and unlabored. Skin warm/dry. Discharge instructions reviewed with patient at this time. Patient given opportunity to voice concerns/ask questions. Patient discharged at this time and left Emergency Department with steady gait.   

## 2012-07-12 NOTE — ED Notes (Addendum)
Pt c/o bilateral ear pain x 1wk  and is currently on amoxicillin but c/o no relief. Pt also c/o headache

## 2012-07-12 NOTE — ED Provider Notes (Signed)
History     CSN: [id number]  Arrival date & time 07/12/12  4098   First MD Initiated Contact with Patient 07/12/12 2016      Chief Complaint  Patient presents with  . Otalgia  . Headache    (Consider location/radiation/quality/duration/timing/severity/associated sxs/prior treatment) Patient is a 37 y.o. male presenting with ear pain and headaches. The history is provided by the patient.  Otalgia This is a new problem. The current episode started more than 1 week ago. There is pain in both ears. The problem occurs daily. The problem has been gradually worsening. There has been no fever. The pain is moderate. Associated symptoms include ear discharge, headaches and cough. Pertinent negatives include no sore throat, no abdominal pain and no neck pain. His past medical history does not include chronic ear infection, hearing loss or tympanostomy tube.  Headache  Pertinent negatives include no palpitations and no shortness of breath.    Past Medical History  Diagnosis Date  . GERD (gastroesophageal reflux disease)   . Diabetes mellitus   . HTN (hypertension)   . Depression   . Anxiety     Past Surgical History  Procedure Date  . Knee surgery   . Back surgery   . Cholecystectomy 2009  . Tonsillectomy     Family History  Problem Relation Age of Onset  . Coronary artery disease Father     History  Substance Use Topics  . Smoking status: Never Smoker   . Smokeless tobacco: Current User     Comment: dip x 20  . Alcohol Use: Yes     Comment: very little 4-5 beers/yr      Review of Systems  Constitutional: Negative for activity change.       All ROS Neg except as noted in HPI  HENT: Positive for ear pain and ear discharge. Negative for nosebleeds, sore throat and neck pain.   Eyes: Negative for photophobia and discharge.  Respiratory: Positive for cough. Negative for shortness of breath and wheezing.   Cardiovascular: Negative for chest pain and palpitations.    Gastrointestinal: Negative for abdominal pain and blood in stool.  Genitourinary: Negative for dysuria, frequency and hematuria.  Musculoskeletal: Negative for back pain and arthralgias.  Skin: Negative.   Neurological: Positive for headaches. Negative for dizziness, seizures and speech difficulty.  Psychiatric/Behavioral: Negative for hallucinations and confusion. The patient is nervous/anxious.     Allergies  Review of patient's allergies indicates no known allergies.  Home Medications   Current Outpatient Rx  Name  Route  Sig  Dispense  Refill  . AMOXICILLIN-POT CLAVULANATE 875-125 MG PO TABS   Oral   Take 1 tablet by mouth 2 (two) times daily. For 2 weeks(started on 07/06/2012)         . ESCITALOPRAM OXALATE 10 MG PO TABS   Oral   Take 10 mg by mouth daily.           Marland Kitchen ESOMEPRAZOLE MAGNESIUM 40 MG PO CPDR   Oral   Take 40 mg by mouth daily before breakfast.           . INSULIN ISOPHANE & REGULAR (70-30) 100 UNIT/ML [place] SUSP   Subcutaneous   Inject 46-76 Units into the skin 2 (two) times daily with a meal. Patient uses 76 units in the morning and 46 units at night         . LISINOPRIL 10 MG PO TABS   Oral   Take 10 mg by mouth daily.           Marland Kitchen  OXYCODONE-ACETAMINOPHEN 10-325 MG PO TABS   Oral   Take 1 tablet by mouth every 4 (four) hours as needed. pain         . PIOGLITAZONE HCL-METFORMIN HCL 15-850 MG PO TABS   Oral   Take 1 tablet by mouth 2 (two) times daily with a meal.           BP 155/80  Pulse 86  Temp 98.6 F (37 C)  Resp 20  Ht 6\' 1"  (1.854 m)  Wt 280 lb (127.007 kg)  BMI 36.94 kg/m2  SpO2 98%  Physical Exam  Nursing note and vitals reviewed. Constitutional: He is oriented to person, place, and time. He appears well-developed and well-nourished.  Non-toxic appearance.  HENT:  Head: Normocephalic.    Right Ear: Tympanic membrane and external ear normal.  Left Ear: Tympanic membrane and external ear normal.  Eyes: EOM and lids  are normal. Pupils are equal, round, and reactive to light.  Neck: Normal range of motion. Neck supple. Carotid bruit is not present.  Cardiovascular: Normal rate, regular rhythm, normal heart sounds, intact distal pulses and normal pulses.   Pulmonary/Chest: Breath sounds normal. No respiratory distress.  Abdominal: Soft. Bowel sounds are normal. There is no tenderness. There is no guarding.  Musculoskeletal: Normal range of motion.  Lymphadenopathy:       Head (right side): No submandibular adenopathy present.       Head (left side): No submandibular adenopathy present.    He has no cervical adenopathy.  Neurological: He is alert and oriented to person, place, and time. He has normal strength. No cranial nerve deficit or sensory deficit. He exhibits normal muscle tone. Coordination normal.       Gait steady.  Skin: Skin is warm and dry.  Psychiatric: He has a normal mood and affect. His speech is normal.    ED Course  Procedures (including critical care time)  Labs Reviewed - No data to display No results found.   No diagnosis found.    MDM  I have reviewed nursing notes, vital signs, and all appropriate lab and imaging results for this patient. Patient presents to the emergency department with complaint of ringing in the ear is and headache. He's not had any injury or trauma to the ear or to the head. He has been treated recently for a sinus infection with Augmentin, but states the sinuses seemed to be better. The ringing in the ear is actually getting worse and the headache is getting worse. CT maxillofacial x-rays and sinus study is negative for any acute changes or problems. The plan at this time is for the patient to be seen by ear nose and throat (Dr TEO). Prescription for Percocet one every 6 hours #15 given to the patient.        Kathie Dike, [place] 07/12/12 2332

## 2012-07-15 NOTE — ED Provider Notes (Signed)
Medical screening examination/treatment/procedure(s) were performed by non-physician practitioner and as supervising physician I was immediately available for consultation/collaboration.  Donnetta Hutching, MD 07/15/12 1734

## 2012-09-16 ENCOUNTER — Encounter: Payer: Self-pay | Admitting: *Deleted

## 2012-09-18 ENCOUNTER — Ambulatory Visit (INDEPENDENT_AMBULATORY_CARE_PROVIDER_SITE_OTHER): Payer: Medicare Other | Admitting: Family Medicine

## 2012-09-18 ENCOUNTER — Encounter: Payer: Self-pay | Admitting: Family Medicine

## 2012-09-18 VITALS — BP 130/87 | Temp 98.3°F | Ht 73.0 in | Wt 304.0 lb

## 2012-09-18 DIAGNOSIS — G8929 Other chronic pain: Secondary | ICD-10-CM | POA: Insufficient documentation

## 2012-09-18 DIAGNOSIS — I10 Essential (primary) hypertension: Secondary | ICD-10-CM

## 2012-09-18 DIAGNOSIS — M545 Low back pain, unspecified: Secondary | ICD-10-CM

## 2012-09-18 DIAGNOSIS — E119 Type 2 diabetes mellitus without complications: Secondary | ICD-10-CM

## 2012-09-18 DIAGNOSIS — L0390 Cellulitis, unspecified: Secondary | ICD-10-CM

## 2012-09-18 DIAGNOSIS — L0291 Cutaneous abscess, unspecified: Secondary | ICD-10-CM

## 2012-09-18 MED ORDER — DOXYCYCLINE HYCLATE 100 MG PO CAPS: 100.0000 mg | ORAL_CAPSULE | Freq: Two times a day (BID) | ORAL | Status: DC

## 2012-09-18 MED ORDER — INSULIN NPH ISOPHANE & REGULAR (70-30) 100 UNIT/ML ~~LOC~~ SUSP: 50.0000 [IU] | Freq: Two times a day (BID) | SUBCUTANEOUS | Status: DC

## 2012-09-18 NOTE — Progress Notes (Signed)
  Subjective:    Patient ID: FLOR HOUDESHELL, male    DOB: [birth year], 37 y.o.   MRN: [id number]  Diabetes He has type 2 diabetes mellitus. His disease course has been worsening. Hypoglycemia symptoms include nervousness/anxiousness. Associated symptoms include fatigue. Pertinent negatives for diabetes include no polydipsia. Symptoms are improving. There are no known risk factors for coronary artery disease. Current diabetic treatment includes insulin injections. He is following a diabetic diet. When asked about meal planning, he reported none. There is no change in his home blood glucose trend. His breakfast blood glucose is taken between 8-9 am. His breakfast blood glucose range is generally 140-180 mg/dl.   Chronic back pain ongoing. Walking some. Taking pain med.  Uses oxycodone every 4 hours. Claims compliance with all medications.  Review of Systems  Constitutional: Positive for fatigue.  Endocrine: Negative for polydipsia.  Psychiatric/Behavioral: The patient is nervous/anxious.        Objective:   Physical Exam Alert. Vitals reviewed. HEENT normal. Lungs clear. Heart regular in rhythm. Feet sensation intact pulses good no significant edema.       Assessment & Plan:  Essential hypertension, benign  Diabetes  Chronic low back pain  Cellulitis  Hypertension overall in good control. Diabetes suboptimum. Discussed. Will increase insulin. Ongoing chronic back pain. Needs his relatively high-dose oxycodone 2 function. Medicines refilled. Followup as scheduled. WSL

## 2012-09-20 ENCOUNTER — Encounter: Payer: Self-pay | Admitting: Family Medicine

## 2012-10-20 ENCOUNTER — Encounter: Payer: Self-pay | Admitting: Family Medicine

## 2012-10-23 ENCOUNTER — Other Ambulatory Visit: Payer: Self-pay | Admitting: *Deleted

## 2012-10-23 MED ORDER — LISINOPRIL 20 MG PO TABS: 20.0000 mg | ORAL_TABLET | Freq: Every day | ORAL | Status: DC

## 2012-10-23 MED ORDER — ESCITALOPRAM OXALATE 10 MG PO TABS: 10.0000 mg | ORAL_TABLET | Freq: Every day | ORAL | Status: DC

## 2012-10-23 MED ORDER — LISINOPRIL 10 MG PO TABS: 20.0000 mg | ORAL_TABLET | Freq: Every day | ORAL | Status: DC

## 2012-11-17 ENCOUNTER — Telehealth: Payer: Self-pay | Admitting: Family Medicine

## 2012-11-17 NOTE — Telephone Encounter (Signed)
error 

## 2012-11-19 ENCOUNTER — Telehealth: Payer: Self-pay | Admitting: *Deleted

## 2012-11-19 NOTE — Telephone Encounter (Signed)
Spoke with pharmacist Lelon Mast at [facility] regarding pt and to cancel any narcotic medications including future refills.  Stated pt had come in yesterday and picked up one refill on narcotics.

## 2012-12-03 ENCOUNTER — Emergency Department: Payer: Self-pay | Admitting: Emergency Medicine

## 2012-12-04 ENCOUNTER — Telehealth: Payer: Self-pay | Admitting: Family Medicine

## 2012-12-04 NOTE — Telephone Encounter (Signed)
Nurse/Dr. Brett Raymond  Patient called in today, he has re-located to the [place], [place] area.   The patient explained how he is going through a bad separation with his spouse.  He would like to have one office visit to be allowed to renew his prescription for percocet. At this time he does have enough to carry him until the 28 th.    Christopher Raymond wanted to also confirm that he has an updated HIPAA disclosure on file, because his wife has been trying to discredit his name. (There is no Privacy Release in his chart at this time)

## 2012-12-04 NOTE — Telephone Encounter (Signed)
ok 

## 2012-12-04 NOTE — Telephone Encounter (Signed)
Pt has been dismissed from the practice for inappropriate use of narcotics. We sent him a certified letter. We absolutely will not prescribe any further pain meds

## 2012-12-04 NOTE — Telephone Encounter (Signed)
Discussed with patient. Patient verbalized understanding. Patient stated he is upset you took the word of somebody else over your own patient. He wanted to make sure I told you that.

## 2013-03-29 ENCOUNTER — Encounter (HOSPITAL_COMMUNITY): Payer: Self-pay

## 2013-03-29 DIAGNOSIS — IMO0001 Reserved for inherently not codable concepts without codable children: Secondary | ICD-10-CM | POA: Insufficient documentation

## 2013-03-29 DIAGNOSIS — R110 Nausea: Secondary | ICD-10-CM | POA: Insufficient documentation

## 2013-03-29 DIAGNOSIS — M5412 Radiculopathy, cervical region: Secondary | ICD-10-CM | POA: Insufficient documentation

## 2013-03-29 DIAGNOSIS — M545 Low back pain, unspecified: Secondary | ICD-10-CM | POA: Insufficient documentation

## 2013-03-29 DIAGNOSIS — R1013 Epigastric pain: Secondary | ICD-10-CM | POA: Insufficient documentation

## 2013-03-29 DIAGNOSIS — IMO0002 Reserved for concepts with insufficient information to code with codable children: Secondary | ICD-10-CM | POA: Insufficient documentation

## 2013-03-29 DIAGNOSIS — I10 Essential (primary) hypertension: Secondary | ICD-10-CM | POA: Insufficient documentation

## 2013-03-29 DIAGNOSIS — Z87448 Personal history of other diseases of urinary system: Secondary | ICD-10-CM | POA: Insufficient documentation

## 2013-03-29 DIAGNOSIS — F411 Generalized anxiety disorder: Secondary | ICD-10-CM | POA: Insufficient documentation

## 2013-03-29 DIAGNOSIS — Z79899 Other long term (current) drug therapy: Secondary | ICD-10-CM | POA: Insufficient documentation

## 2013-03-29 DIAGNOSIS — E669 Obesity, unspecified: Secondary | ICD-10-CM | POA: Insufficient documentation

## 2013-03-29 DIAGNOSIS — K2970 Gastritis, unspecified, without bleeding: Secondary | ICD-10-CM | POA: Insufficient documentation

## 2013-03-29 DIAGNOSIS — F329 Major depressive disorder, single episode, unspecified: Secondary | ICD-10-CM | POA: Insufficient documentation

## 2013-03-29 DIAGNOSIS — G8929 Other chronic pain: Secondary | ICD-10-CM | POA: Insufficient documentation

## 2013-03-29 DIAGNOSIS — R1012 Left upper quadrant pain: Secondary | ICD-10-CM | POA: Insufficient documentation

## 2013-03-29 DIAGNOSIS — F3289 Other specified depressive episodes: Secondary | ICD-10-CM | POA: Insufficient documentation

## 2013-03-29 DIAGNOSIS — R209 Unspecified disturbances of skin sensation: Secondary | ICD-10-CM | POA: Insufficient documentation

## 2013-03-29 DIAGNOSIS — E119 Type 2 diabetes mellitus without complications: Secondary | ICD-10-CM | POA: Insufficient documentation

## 2013-03-29 DIAGNOSIS — K219 Gastro-esophageal reflux disease without esophagitis: Secondary | ICD-10-CM | POA: Insufficient documentation

## 2013-03-29 DIAGNOSIS — Z794 Long term (current) use of insulin: Secondary | ICD-10-CM | POA: Insufficient documentation

## 2013-03-29 LAB — POCT I-STAT, CHEM 8
Creatinine, Ser: 0.8 mg/dL (ref 0.50–1.35)
HCT: 36 % — ABNORMAL LOW (ref 39.0–52.0)
Hemoglobin: 12.2 g/dL — ABNORMAL LOW (ref 13.0–17.0)
Potassium: 3.9 mEq/L (ref 3.5–5.1)
Sodium: 137 mEq/L (ref 135–145)
TCO2: 27 mmol/L (ref 0–100)

## 2013-03-29 NOTE — ED Notes (Addendum)
Pt reports Left arm numbness and tingling, Left leg pain, nausea, and lower back pain starting at lunch time. Pt denies chest pain, vomiting, fever, cough, congestion, or SOB. No neuro deficits, no facial droop or arm drift, grips and strengths equal bilaterally

## 2013-03-30 ENCOUNTER — Emergency Department (HOSPITAL_COMMUNITY)
Admission: EM | Admit: 2013-03-30 | Discharge: 2013-03-30 | Disposition: A | Discharge status: Home / Self Care | Payer: Medicare Other | Attending: Emergency Medicine | Admitting: Emergency Medicine

## 2013-03-30 DIAGNOSIS — M5416 Radiculopathy, lumbar region: Secondary | ICD-10-CM

## 2013-03-30 DIAGNOSIS — K2970 Gastritis, unspecified, without bleeding: Secondary | ICD-10-CM

## 2013-03-30 DIAGNOSIS — M5412 Radiculopathy, cervical region: Secondary | ICD-10-CM

## 2013-03-30 LAB — CBC WITH DIFFERENTIAL/PLATELET
Basophils Absolute: 0 10*3/uL (ref 0.0–0.1)
Basophils Relative: 1 % (ref 0–1)
Lymphocytes Relative: 40 % (ref 12–46)
MCHC: 33.8 g/dL (ref 30.0–36.0)
Neutro Abs: 1.9 10*3/uL (ref 1.7–7.7)
Platelets: 205 10*3/uL (ref 150–400)
RDW: 12.9 % (ref 11.5–15.5)
WBC: 3.7 10*3/uL — ABNORMAL LOW (ref 4.0–10.5)

## 2013-03-30 LAB — COMPREHENSIVE METABOLIC PANEL
ALT: 12 U/L (ref 0–53)
AST: 12 U/L (ref 0–37)
Albumin: 3.6 g/dL (ref 3.5–5.2)
CO2: 29 mEq/L (ref 19–32)
Calcium: 8.9 mg/dL (ref 8.4–10.5)
Chloride: 101 mEq/L (ref 96–112)
GFR calc non Af Amer: >90 mL/min (ref >90)
Potassium: 3.9 mEq/L (ref 3.5–5.1)
Sodium: 140 mEq/L (ref 135–145)
Total Bilirubin: 0.2 mg/dL — ABNORMAL LOW (ref 0.3–1.2)

## 2013-03-30 MED ORDER — ESOMEPRAZOLE MAGNESIUM 40 MG PO CPDR: 40.0000 mg | DELAYED_RELEASE_CAPSULE | Freq: Every day | ORAL | Status: AC

## 2013-03-30 MED ORDER — METHOCARBAMOL 500 MG PO TABS
500.0000 mg | ORAL_TABLET | Freq: Once | ORAL | Status: AC
  Administered 2013-03-30: 500 mg via ORAL
  Filled 2013-03-30: qty 1

## 2013-03-30 MED ORDER — METHOCARBAMOL 500 MG PO TABS: 500.0000 mg | ORAL_TABLET | Freq: Two times a day (BID) | ORAL | Status: DC

## 2013-03-30 MED ORDER — KETOROLAC TROMETHAMINE 60 MG/2ML IM SOLN
60.0000 mg | Freq: Once | INTRAMUSCULAR | Status: AC
  Administered 2013-03-30: 60 mg via INTRAMUSCULAR
  Filled 2013-03-30: qty 2

## 2013-03-30 MED ORDER — OXYCODONE-ACETAMINOPHEN 5-325 MG PO TABS: 1.0000 | ORAL_TABLET | ORAL | Status: DC | PRN

## 2013-03-30 MED ORDER — OXYCODONE-ACETAMINOPHEN 5-325 MG PO TABS
2.0000 | ORAL_TABLET | Freq: Once | ORAL | Status: AC
  Administered 2013-03-30: 2 via ORAL
  Filled 2013-03-30: qty 2

## 2013-03-30 NOTE — ED Provider Notes (Signed)
CSN: [id number]     Arrival date & time 03/29/13  2028 History   First MD Initiated Contact with Patient 03/30/13 0010     Chief Complaint  Patient presents with  . Numbness  . Leg Pain   (Consider location/radiation/quality/duration/timing/severity/associated sxs/prior Treatment) HPI Patient has a history of chronic lumbar pain and radiculopathy into the left leg. He has seen neurosurgery in the past. He now complains of paresthesias to the radial surface of the left hand and pain radiating from his left shoulder into his left arm. Patient states that he woke this morning with this pain. He has not had upper extremity paresthesias or pain before. He had no trauma. He also complains of worsening left lower back and buttock pain. He denies any numbness or weakness in either extremity. He denies any urinary or bowel incontinence. He denies any fever or chills. Patient has had left upper quadrant abdominal discomfort and nausea. He has a history of gastroesophageal reflux disease but denies taking any NSAIDs recently. Patient has no chest pain or shortness of breath. Past Medical History  Diagnosis Date  . GERD (gastroesophageal reflux disease)   . Diabetes mellitus   . HTN (hypertension)   . Depression   . Anxiety   . Chronic pain   . ED (erectile dysfunction)   . Hyperlipidemia    Past Surgical History  Procedure Laterality Date  . Knee surgery    . Back surgery    . Cholecystectomy  2009  . Tonsillectomy    . Cholecystectomy open     Family History  Problem Relation Age of Onset  . Coronary artery disease Father   . Heart attack Father    History  Substance Use Topics  . Smoking status: Never Smoker   . Smokeless tobacco: Current User    Types: Snuff     Comment: dip x 20  . Alcohol Use: Yes     Comment: very little 4-5 beers/yr    Review of Systems  Constitutional: Negative for fever and chills.  HENT: Negative for neck pain and neck stiffness.   Eyes: Negative for  visual disturbance.  Respiratory: Negative for chest tightness and shortness of breath.   Cardiovascular: Negative for chest pain, palpitations and leg swelling.  Gastrointestinal: Positive for nausea and abdominal pain. Negative for vomiting, diarrhea, constipation and blood in stool.  Genitourinary: Negative for dysuria, hematuria, flank pain and difficulty urinating.  Musculoskeletal: Positive for myalgias and back pain.  Skin: Negative for rash and wound.  Neurological: Positive for numbness (Paresthesia). Negative for dizziness, tremors, seizures, syncope, speech difficulty, weakness, light-headedness and headaches.  All other systems reviewed and are negative.    Allergies  Review of patient's allergies indicates no active allergies.  Home Medications   Current Outpatient Rx  Name  Route  Sig  Dispense  Refill  . escitalopram (LEXAPRO) 10 MG tablet   Oral   Take 1 tablet (10 mg total) by mouth daily.   30 tablet   5   . esomeprazole (NEXIUM) 40 MG capsule   Oral   Take 40 mg by mouth daily before breakfast.           . insulin NPH-insulin regular (NOVOLIN 70/30) (70-30) 100 UNIT/ML injection   Subcutaneous   Inject 50-84 Units into the skin 2 (two) times daily with a meal. 84 units in the am and then 50 units in the evening   10 mL   5   . lisinopril (PRINIVIL,ZESTRIL) 20 MG  tablet   Oral   Take 1 tablet (20 mg total) by mouth daily.   30 tablet   5   . oxyCODONE-acetaminophen (PERCOCET) 10-325 MG per tablet   Oral   Take 1 tablet by mouth every 4 (four) hours as needed. pain         . pioglitazone-metformin (ACTOPLUS MET) 15-850 MG per tablet   Oral   Take 1 tablet by mouth 2 (two) times daily with a meal.          BP 151/88  Pulse 86  Temp(Src) 98.1 F (36.7 C) (Oral)  Resp 16  Ht 6\' 1"  (1.854 m)  Wt 270 lb (122.471 kg)  BMI 35.63 kg/m2  SpO2 99% Physical Exam  Nursing note and vitals reviewed. Constitutional: He is oriented to person, place,  and time. He appears well-developed and well-nourished. No distress.  Obese  HENT:  Head: Normocephalic and atraumatic.  Mouth/Throat: Oropharynx is clear and moist.  Eyes: EOM are normal. Pupils are equal, round, and reactive to light.  Neck: Normal range of motion. Neck supple.  No midline cervical tenderness to palpation. No meningismus  Cardiovascular: Normal rate and regular rhythm.   Pulmonary/Chest: Effort normal and breath sounds normal. No respiratory distress. He has no wheezes. He has no rales. He exhibits no tenderness.  Abdominal: Soft. Bowel sounds are normal. He exhibits no distension and no mass. There is tenderness (mild epigastric and left upper quadrant tenderness). There is no rebound and no guarding.  Musculoskeletal: Normal range of motion. He exhibits no edema and no tenderness.  Patient has tenderness to palpation over his left trapezius muscle and deltoid.. No obvious deformity or trauma. Good distal pulses in all extremities. No swelling or edema. Patient has tenderness to palpation over his left lumbar paraspinal muscles. He has negative straight leg raise. States pain radiates into his left buttocks when knee is extended. no calf swelling or tenderness. No midline tenderness, deformity or step off of the thoracic and lumbar spine  Neurological: He is alert and oriented to person, place, and time.  Intrinsic muscles of the hand with normal strength. Negative Phalen and Tinel's sign. He has paresthesias or subjective over the radial portion of the hand extending to the fourth digit. Patient has 5/5 strength in all extremities. He has no saddle anesthesia.  Skin: Skin is warm and dry. No rash noted. No erythema.    ED Course  Procedures (including critical care time) Labs Review Labs Reviewed  POCT I-STAT, CHEM 8 - Abnormal; Notable for the following:    Glucose, Bld 293 (*)    Hemoglobin 12.2 (*)    HCT 36.0 (*)    All other components within normal limits  CBC  WITH DIFFERENTIAL  COMPREHENSIVE METABOLIC PANEL  LIPASE, BLOOD   Imaging Review No results found.  MDM  Patient's symptoms and physical exam indicate that he has radiculopathy of the lumbar and cervical spine. I suspect his left upper quadrant pain is an exacerbation of his gastritis/GERD. Given his medical history we will check basic labs and treat symptomatically. I do not believe that imaging is not necessary at this point. I have advised the patient he will likely need to followup with his spine surgeon to be reevaluated.  Patient states his pain is greatly improved. He admits to running out of his Nexium 2 days ago. We'll discharge home with symptomatic relief and refill his Nexium. He's been advised to followup with spine surgeon. Return precautions have been given  Loren Racer, MD 03/30/13 [phone]

## 2013-03-30 NOTE — ED Notes (Signed)
Patient explains that he woke yesterday morning with numbness and tingling to left side of body including: Left shoulder/arm/fingertips, and left posterior leg.

## 2013-05-03 ENCOUNTER — Emergency Department (HOSPITAL_COMMUNITY)
Admission: EM | Admit: 2013-05-03 | Discharge: 2013-05-03 | Disposition: A | Discharge status: Home / Self Care | Payer: Medicare Other | Attending: Emergency Medicine | Admitting: Emergency Medicine

## 2013-05-03 ENCOUNTER — Encounter (HOSPITAL_COMMUNITY): Payer: Self-pay | Admitting: Emergency Medicine

## 2013-05-03 DIAGNOSIS — Z9889 Other specified postprocedural states: Secondary | ICD-10-CM | POA: Insufficient documentation

## 2013-05-03 DIAGNOSIS — F411 Generalized anxiety disorder: Secondary | ICD-10-CM | POA: Insufficient documentation

## 2013-05-03 DIAGNOSIS — Z794 Long term (current) use of insulin: Secondary | ICD-10-CM | POA: Insufficient documentation

## 2013-05-03 DIAGNOSIS — F3289 Other specified depressive episodes: Secondary | ICD-10-CM | POA: Insufficient documentation

## 2013-05-03 DIAGNOSIS — E119 Type 2 diabetes mellitus without complications: Secondary | ICD-10-CM | POA: Insufficient documentation

## 2013-05-03 DIAGNOSIS — G8929 Other chronic pain: Secondary | ICD-10-CM | POA: Insufficient documentation

## 2013-05-03 DIAGNOSIS — Z791 Long term (current) use of non-steroidal anti-inflammatories (NSAID): Secondary | ICD-10-CM | POA: Insufficient documentation

## 2013-05-03 DIAGNOSIS — K219 Gastro-esophageal reflux disease without esophagitis: Secondary | ICD-10-CM | POA: Insufficient documentation

## 2013-05-03 DIAGNOSIS — M5412 Radiculopathy, cervical region: Secondary | ICD-10-CM

## 2013-05-03 DIAGNOSIS — F329 Major depressive disorder, single episode, unspecified: Secondary | ICD-10-CM | POA: Insufficient documentation

## 2013-05-03 DIAGNOSIS — I10 Essential (primary) hypertension: Secondary | ICD-10-CM | POA: Insufficient documentation

## 2013-05-03 DIAGNOSIS — Z79899 Other long term (current) drug therapy: Secondary | ICD-10-CM | POA: Insufficient documentation

## 2013-05-03 DIAGNOSIS — Z87448 Personal history of other diseases of urinary system: Secondary | ICD-10-CM | POA: Insufficient documentation

## 2013-05-03 MED ORDER — KETOROLAC TROMETHAMINE 60 MG/2ML IM SOLN
60.0000 mg | Freq: Once | INTRAMUSCULAR | Status: AC
  Administered 2013-05-03: 60 mg via INTRAMUSCULAR
  Filled 2013-05-03: qty 2

## 2013-05-03 MED ORDER — HYDROCODONE-ACETAMINOPHEN 5-325 MG PO TABS: 2.0000 | ORAL_TABLET | ORAL | Status: DC | PRN

## 2013-05-03 MED ORDER — NAPROXEN 500 MG PO TABS: 500.0000 mg | ORAL_TABLET | Freq: Two times a day (BID) | ORAL | Status: DC

## 2013-05-03 MED ORDER — DEXAMETHASONE SODIUM PHOSPHATE 10 MG/ML IJ SOLN
10.0000 mg | Freq: Once | INTRAMUSCULAR | Status: AC
  Administered 2013-05-03: 10 mg via INTRAMUSCULAR
  Filled 2013-05-03: qty 1

## 2013-05-03 NOTE — ED Provider Notes (Addendum)
CSN: [id number]     Arrival date & time 05/03/13  2301 History   First MD Initiated Contact with Patient 05/03/13 2310     This chart was scribed for Vida Roller, MD by Arlan Organ, ED Scribe. This patient was seen in room APA05/APA05 and the patient's care was started 11:20 PM.   Chief Complaint  Patient presents with  . Shoulder Pain    The history is provided by the patient. No language interpreter was used.   HPI Comments: Christopher Raymond is a 37 y.o. male who presents to the Emergency Department complaining of shoulder pain that started 24 hours ago. He also reports associated radiating pain down his right elbow, along with mild tingling in some of his fingers. Pt states he has experienced a similar episode in the past month and they have been there daily since though they wax and wane in intensity. He says movement worsens the pain, and  described the feeling as "tight". Pt denies ever, chills, nausea, emesis, or visual changes. Pt denies any know allergies.  Has associated numbness in the 4th and 5th fingers of the L hand when pain hurts.  Has f/u appt' with Dr. Channing Mutters next week.  Past Medical History  Diagnosis Date  . GERD (gastroesophageal reflux disease)   . Diabetes mellitus   . HTN (hypertension)   . Depression   . Anxiety   . Chronic pain   . ED (erectile dysfunction)   . Hyperlipidemia    Past Surgical History  Procedure Laterality Date  . Knee surgery    . Back surgery    . Cholecystectomy  2009  . Tonsillectomy    . Cholecystectomy open     Family History  Problem Relation Age of Onset  . Coronary artery disease Father   . Heart attack Father    History  Substance Use Topics  . Smoking status: Never Smoker   . Smokeless tobacco: Current User    Types: Snuff     Comment: dip x 20  . Alcohol Use: Yes     Comment: very little 4-5 beers/yr    Review of Systems  Musculoskeletal: Positive for arthralgias (shoulder pain).  All other systems reviewed and  are negative.    Allergies  Review of patient's allergies indicates no known allergies.  Home Medications   Current Outpatient Rx  Name  Route  Sig  Dispense  Refill  . escitalopram (LEXAPRO) 10 MG tablet   Oral   Take 1 tablet (10 mg total) by mouth daily.   30 tablet   5   . esomeprazole (NEXIUM) 40 MG capsule   Oral   Take 1 capsule (40 mg total) by mouth daily before breakfast.   30 capsule   0   . HYDROcodone-acetaminophen (NORCO/VICODIN) 5-325 MG per tablet   Oral   Take 2 tablets by mouth every 4 (four) hours as needed.   10 tablet   0   . HYDROcodone-acetaminophen (NORCO/VICODIN) 5-325 MG per tablet   Oral   Take 2 tablets by mouth every 4 (four) hours as needed.   6 tablet   0   . insulin NPH-insulin regular (NOVOLIN 70/30) (70-30) 100 UNIT/ML injection   Subcutaneous   Inject 50-84 Units into the skin 2 (two) times daily with a meal. 84 units in the am and then 50 units in the evening   10 mL   5   . lisinopril (PRINIVIL,ZESTRIL) 20 MG tablet   Oral  Take 1 tablet (20 mg total) by mouth daily.   30 tablet   5   . methocarbamol (ROBAXIN) 500 MG tablet   Oral   Take 1 tablet (500 mg total) by mouth 2 (two) times daily.   20 tablet   0   . naproxen (NAPROSYN) 500 MG tablet   Oral   Take 1 tablet (500 mg total) by mouth 2 (two) times daily with a meal.   30 tablet   0   . oxyCODONE-acetaminophen (PERCOCET) 10-325 MG per tablet   Oral   Take 1 tablet by mouth every 4 (four) hours as needed. pain         . oxyCODONE-acetaminophen (PERCOCET) 5-325 MG per tablet   Oral   Take 1 tablet by mouth every 4 (four) hours as needed for pain.   10 tablet   0   . pioglitazone-metformin (ACTOPLUS MET) 15-850 MG per tablet   Oral   Take 1 tablet by mouth 2 (two) times daily with a meal.          Triage Vitals: BP 168/101  Pulse 115  Temp(Src) 98.5 F (36.9 C) (Oral)  Resp 20  Ht 6\' 1"  (1.854 m)  Wt 270 lb (122.471 kg)  BMI 35.63 kg/m2  SpO2  100%  Physical Exam  Nursing note and vitals reviewed. Constitutional: He is oriented to person, place, and time. He appears well-developed and well-nourished. He is active.  HENT:  Head: Atraumatic.  Eyes: Pupils are equal, round, and reactive to light.  Neck: Normal range of motion.  Cardiovascular: Normal rate, regular rhythm, normal heart sounds and intact distal pulses.   Pulmonary/Chest: Effort normal and breath sounds normal.  Abdominal: Soft. Normal appearance.  Musculoskeletal: Normal range of motion.  Neurological: He is alert and oriented to person, place, and time. He has normal reflexes.  Positive axial load, normal strength and sensation to all areas of the left upper extremity, normal strength in each of the dermatomes and in the radial median and ulnar nerve distributions. The pain is reproducible with range of motion of the arm as well as axial load  Skin: Skin is warm.    ED Course  Procedures (including critical care time)  DIAGNOSTIC STUDIES: Oxygen Saturation is 100% on RA, Normal by my interpretation.    COORDINATION OF CARE: 11:20 PM-Discussed treatment plan with pt at bedside and pt agreed to plan.     Labs Review Labs Reviewed - No data to display Imaging Review No results found.  EKG Interpretation   None       MDM   1. Radiculopathy of cervical region    Axial load on head reproduces his sx in the L arm - no focal neuro defectis on exam of the arm otherwise - story c/w radiculopathy -   Meds given in ED:  Medications  dexamethasone (DECADRON) injection 10 mg (10 mg Intramuscular Given 05/03/13 2336)  ketorolac (TORADOL) injection 60 mg (60 mg Intramuscular Given 05/03/13 2335)    New Prescriptions   HYDROCODONE-ACETAMINOPHEN (NORCO/VICODIN) 5-325 MG PER TABLET    Take 2 tablets by mouth every 4 (four) hours as needed.   HYDROCODONE-ACETAMINOPHEN (NORCO/VICODIN) 5-325 MG PER TABLET    Take 2 tablets by mouth every 4 (four) hours as  needed.   NAPROXEN (NAPROSYN) 500 MG TABLET    Take 1 tablet (500 mg total) by mouth 2 (two) times daily with a meal.     I personally performed the services described in  this documentation, which was scribed in my presence. The recorded information has been reviewed and is accurate.    Vida Roller, MD 05/03/13 [phone]

## 2013-05-03 NOTE — ED Notes (Signed)
Pain rt shoulder withnumbness in lt arm. Hx of "pinched nerve" and has appt with Dr Channing Mutters .  No  sob

## 2013-05-17 MED FILL — Hydrocodone-Acetaminophen Tab 5-325 MG: ORAL | Qty: 6 | Status: AC

## 2013-05-20 ENCOUNTER — Emergency Department (HOSPITAL_COMMUNITY)
Admission: EM | Admit: 2013-05-20 | Discharge: 2013-05-21 | Disposition: A | Discharge status: Home / Self Care | Payer: Medicare Other | Attending: Emergency Medicine | Admitting: Emergency Medicine

## 2013-05-20 ENCOUNTER — Encounter (HOSPITAL_COMMUNITY): Payer: Self-pay | Admitting: Emergency Medicine

## 2013-05-20 DIAGNOSIS — F329 Major depressive disorder, single episode, unspecified: Secondary | ICD-10-CM | POA: Insufficient documentation

## 2013-05-20 DIAGNOSIS — M5412 Radiculopathy, cervical region: Secondary | ICD-10-CM | POA: Insufficient documentation

## 2013-05-20 DIAGNOSIS — Z794 Long term (current) use of insulin: Secondary | ICD-10-CM | POA: Insufficient documentation

## 2013-05-20 DIAGNOSIS — F3289 Other specified depressive episodes: Secondary | ICD-10-CM | POA: Insufficient documentation

## 2013-05-20 DIAGNOSIS — E119 Type 2 diabetes mellitus without complications: Secondary | ICD-10-CM | POA: Insufficient documentation

## 2013-05-20 DIAGNOSIS — F411 Generalized anxiety disorder: Secondary | ICD-10-CM | POA: Insufficient documentation

## 2013-05-20 DIAGNOSIS — G8929 Other chronic pain: Secondary | ICD-10-CM | POA: Insufficient documentation

## 2013-05-20 DIAGNOSIS — Z79899 Other long term (current) drug therapy: Secondary | ICD-10-CM | POA: Insufficient documentation

## 2013-05-20 DIAGNOSIS — R209 Unspecified disturbances of skin sensation: Secondary | ICD-10-CM | POA: Insufficient documentation

## 2013-05-20 DIAGNOSIS — I10 Essential (primary) hypertension: Secondary | ICD-10-CM | POA: Insufficient documentation

## 2013-05-20 DIAGNOSIS — K219 Gastro-esophageal reflux disease without esophagitis: Secondary | ICD-10-CM | POA: Insufficient documentation

## 2013-05-20 DIAGNOSIS — Z87448 Personal history of other diseases of urinary system: Secondary | ICD-10-CM | POA: Insufficient documentation

## 2013-05-20 NOTE — ED Notes (Signed)
Pt c/o pain to left shoulder. Ibuprofen with no relief. Appt. With Dr. Channing Mutters on Dec. 3rd, 2014

## 2013-05-21 MED ORDER — CYCLOBENZAPRINE HCL 10 MG PO TABS
10.0000 mg | ORAL_TABLET | Freq: Once | ORAL | Status: AC
  Administered 2013-05-21: 10 mg via ORAL
  Filled 2013-05-21: qty 1

## 2013-05-21 MED ORDER — TRAMADOL HCL 50 MG PO TABS: 50.0000 mg | ORAL_TABLET | Freq: Four times a day (QID) | ORAL | Status: DC | PRN

## 2013-05-21 MED ORDER — HYDROMORPHONE HCL PF 1 MG/ML IJ SOLN
1.0000 mg | Freq: Once | INTRAMUSCULAR | Status: AC
  Administered 2013-05-21: 1 mg via INTRAMUSCULAR
  Filled 2013-05-21: qty 1

## 2013-05-21 MED ORDER — IBUPROFEN 800 MG PO TABS: 800.0000 mg | ORAL_TABLET | Freq: Three times a day (TID) | ORAL | Status: DC

## 2013-05-21 MED ORDER — CYCLOBENZAPRINE HCL 10 MG PO TABS: 10.0000 mg | ORAL_TABLET | Freq: Two times a day (BID) | ORAL | Status: DC | PRN

## 2013-05-21 NOTE — ED Provider Notes (Addendum)
CSN: [id number]     Arrival date & time 05/20/13  2314 History   First MD Initiated Contact with Patient 05/20/13 2317     Chief Complaint  Patient presents with  . Shoulder Pain   (Consider location/radiation/quality/duration/timing/severity/associated sxs/prior Treatment) HPI Christopher Raymond is a 37 y.o. male who presents to the Emergency Department complaining of shoulder pain that started about a month ago. He also reports associated radiating pain down his left arm, along with mild tingling in fourth and fifth fingers. Pt states he has experienced a similar episode in the past.  movement worsens the pain. Pain is sharp and severe at times. Pt denies fever, chills, nausea, emesis, or visual changes. Pt denies any know allergies.Has f/u appt' with Dr. Channing Mutters next week. He missed his last appointment for follow up. He's been taking Motrin at home tonight without relief.   Past Medical History  Diagnosis Date  . GERD (gastroesophageal reflux disease)   . Diabetes mellitus   . HTN (hypertension)   . Depression   . Anxiety   . Chronic pain   . ED (erectile dysfunction)   . Hyperlipidemia    Past Surgical History  Procedure Laterality Date  . Knee surgery    . Back surgery    . Cholecystectomy  2009  . Tonsillectomy    . Cholecystectomy open     Family History  Problem Relation Age of Onset  . Coronary artery disease Father   . Heart attack Father    History  Substance Use Topics  . Smoking status: Never Smoker   . Smokeless tobacco: Current User    Types: Snuff     Comment: dip x 20  . Alcohol Use: Yes     Comment: very little 4-5 beers/yr    Review of Systems  Constitutional: Negative for fever and chills.  Eyes: Negative for visual disturbance.  Respiratory: Negative for shortness of breath.   Cardiovascular: Negative for chest pain.  Gastrointestinal: Negative for vomiting and abdominal pain.  Genitourinary: Negative for flank pain.  Musculoskeletal: Positive for  neck pain. Negative for back pain and neck stiffness.  Skin: Negative for rash.  Neurological: Negative for headaches.  All other systems reviewed and are negative.    Allergies  Review of patient's allergies indicates no known allergies.  Home Medications   Current Outpatient Rx  Name  Route  Sig  Dispense  Refill  . escitalopram (LEXAPRO) 10 MG tablet   Oral   Take 1 tablet (10 mg total) by mouth daily.   30 tablet   5   . esomeprazole (NEXIUM) 40 MG capsule   Oral   Take 1 capsule (40 mg total) by mouth daily before breakfast.   30 capsule   0   . insulin NPH-insulin regular (NOVOLIN 70/30) (70-30) 100 UNIT/ML injection   Subcutaneous   Inject 50-84 Units into the skin 2 (two) times daily with a meal. 84 units in the am and then 50 units in the evening   10 mL   5   . lisinopril (PRINIVIL,ZESTRIL) 20 MG tablet   Oral   Take 1 tablet (20 mg total) by mouth daily.   30 tablet   5   . pioglitazone-metformin (ACTOPLUS MET) 15-850 MG per tablet   Oral   Take 1 tablet by mouth 2 (two) times daily with a meal.          BP 182/90  Pulse 106  Temp(Src) 98.6 F (37 C) (Oral)  Resp 20  Ht 6\' 1"  (1.854 m)  Wt 275 lb (124.739 kg)  BMI 36.29 kg/m2  SpO2 98% Physical Exam  Constitutional: He is oriented to person, place, and time. He appears well-developed and well-nourished.  HENT:  Head: Normocephalic and atraumatic.  Eyes: EOM are normal. Pupils are equal, round, and reactive to light.  Neck: Neck supple.  No midline cervical tenderness or deformity. Some left trapezial tenderness.  Cardiovascular: Regular rhythm and intact distal pulses.   Pulmonary/Chest: Effort normal. No respiratory distress.  Musculoskeletal: Normal range of motion. He exhibits no edema.  Reproducible symptoms with axial load. There is no deformity or point tenderness over the shoulder. He has equal grip strengths, biceps and triceps strengths. Sensorium to light touch equal and intact  throughout with subjective numbness to left fourth and fifth digits  Neurological: He is alert and oriented to person, place, and time.  Skin: Skin is warm and dry.    ED Course  Procedures (including critical care time)  IM Dilaudid, PO Flexeril and ice provided. Motrin prior to arrival Previous records reviewed and has been evaluated for cervical radiculopathy here 2 weeks ago  Plan discharge home with instructions and precautions verbalizes understood. Patient has scheduled outpatient followup.     MDM  Diagnosis: Cervical radiculopathy  Old records reviewed. Vital signs and nursing notes reviewed. Symptoms improving with IM narcotics and medications    Sunnie Nielsen, MD 05/21/13 1610  Sunnie Nielsen, MD 05/21/13 [phone]

## 2013-06-11 ENCOUNTER — Encounter (HOSPITAL_COMMUNITY): Payer: Self-pay | Admitting: Emergency Medicine

## 2013-06-11 ENCOUNTER — Emergency Department (HOSPITAL_COMMUNITY): Payer: Medicare Other

## 2013-06-11 ENCOUNTER — Emergency Department (HOSPITAL_COMMUNITY)
Admission: EM | Admit: 2013-06-11 | Discharge: 2013-06-11 | Disposition: A | Discharge status: Home / Self Care | Payer: Medicare Other | Attending: Emergency Medicine | Admitting: Emergency Medicine

## 2013-06-11 DIAGNOSIS — I10 Essential (primary) hypertension: Secondary | ICD-10-CM | POA: Insufficient documentation

## 2013-06-11 DIAGNOSIS — F329 Major depressive disorder, single episode, unspecified: Secondary | ICD-10-CM | POA: Insufficient documentation

## 2013-06-11 DIAGNOSIS — Z23 Encounter for immunization: Secondary | ICD-10-CM | POA: Insufficient documentation

## 2013-06-11 DIAGNOSIS — Z87448 Personal history of other diseases of urinary system: Secondary | ICD-10-CM | POA: Insufficient documentation

## 2013-06-11 DIAGNOSIS — E1169 Type 2 diabetes mellitus with other specified complication: Secondary | ICD-10-CM | POA: Insufficient documentation

## 2013-06-11 DIAGNOSIS — K219 Gastro-esophageal reflux disease without esophagitis: Secondary | ICD-10-CM | POA: Insufficient documentation

## 2013-06-11 DIAGNOSIS — G8929 Other chronic pain: Secondary | ICD-10-CM | POA: Insufficient documentation

## 2013-06-11 DIAGNOSIS — Z79899 Other long term (current) drug therapy: Secondary | ICD-10-CM | POA: Insufficient documentation

## 2013-06-11 DIAGNOSIS — R739 Hyperglycemia, unspecified: Secondary | ICD-10-CM

## 2013-06-11 DIAGNOSIS — Z794 Long term (current) use of insulin: Secondary | ICD-10-CM | POA: Insufficient documentation

## 2013-06-11 DIAGNOSIS — E11628 Type 2 diabetes mellitus with other skin complications: Secondary | ICD-10-CM

## 2013-06-11 DIAGNOSIS — R112 Nausea with vomiting, unspecified: Secondary | ICD-10-CM | POA: Insufficient documentation

## 2013-06-11 DIAGNOSIS — F3289 Other specified depressive episodes: Secondary | ICD-10-CM | POA: Insufficient documentation

## 2013-06-11 DIAGNOSIS — L089 Local infection of the skin and subcutaneous tissue, unspecified: Secondary | ICD-10-CM | POA: Insufficient documentation

## 2013-06-11 DIAGNOSIS — F411 Generalized anxiety disorder: Secondary | ICD-10-CM | POA: Insufficient documentation

## 2013-06-11 LAB — CBC WITH DIFFERENTIAL/PLATELET
Basophils Absolute: 0 10*3/uL (ref 0.0–0.1)
Eosinophils Absolute: 0.1 10*3/uL (ref 0.0–0.7)
Eosinophils Relative: 2 % (ref 0–5)
HCT: 38.3 % — ABNORMAL LOW (ref 39.0–52.0)
Hemoglobin: 13 g/dL (ref 13.0–17.0)
Lymphocytes Relative: 20 % (ref 12–46)
Lymphs Abs: 1 10*3/uL (ref 0.7–4.0)
MCH: 27.9 pg (ref 26.0–34.0)
MCV: 82.2 fL (ref 78.0–100.0)
Monocytes Absolute: 0.3 10*3/uL (ref 0.1–1.0)
Monocytes Relative: 5 % (ref 3–12)
Platelets: 221 10*3/uL (ref 150–400)
RBC: 4.66 MIL/uL (ref 4.22–5.81)
RDW: 13.3 % (ref 11.5–15.5)
WBC: 5.1 10*3/uL (ref 4.0–10.5)

## 2013-06-11 LAB — BASIC METABOLIC PANEL
BUN: 9 mg/dL (ref 6–23)
CO2: 26 mEq/L (ref 19–32)
Chloride: 98 mEq/L (ref 96–112)
GFR calc Af Amer: >90 mL/min (ref >90)
GFR calc non Af Amer: >90 mL/min (ref >90)
Sodium: 137 mEq/L (ref 135–145)

## 2013-06-11 LAB — GLUCOSE, CAPILLARY: Glucose-Capillary: 301 mg/dL — ABNORMAL HIGH (ref 70–99)

## 2013-06-11 MED ORDER — OXYCODONE-ACETAMINOPHEN 5-325 MG PO TABS: 1.0000 | ORAL_TABLET | ORAL | Status: DC | PRN

## 2013-06-11 MED ORDER — MORPHINE SULFATE 4 MG/ML IJ SOLN
4.0000 mg | Freq: Once | INTRAMUSCULAR | Status: AC
Start: 2013-06-11 — End: 2013-06-11
  Administered 2013-06-11: 4 mg via INTRAVENOUS
  Filled 2013-06-11: qty 1

## 2013-06-11 MED ORDER — AMOXICILLIN-POT CLAVULANATE 875-125 MG PO TABS: 1.0000 | ORAL_TABLET | Freq: Two times a day (BID) | ORAL | Status: DC

## 2013-06-11 MED ORDER — ONDANSETRON HCL 4 MG/2ML IJ SOLN
4.0000 mg | Freq: Once | INTRAMUSCULAR | Status: AC
  Administered 2013-06-11: 4 mg via INTRAVENOUS
  Filled 2013-06-11: qty 2

## 2013-06-11 MED ORDER — TETANUS-DIPHTH-ACELL PERTUSSIS 5-2.5-18.5 LF-MCG/0.5 IM SUSP
0.5000 mL | Freq: Once | INTRAMUSCULAR | Status: AC
  Administered 2013-06-11: 0.5 mL via INTRAMUSCULAR
  Filled 2013-06-11: qty 0.5

## 2013-06-11 MED ORDER — SODIUM CHLORIDE 0.9 % IV BOLUS (SEPSIS)
1000.0000 mL | Freq: Once | INTRAVENOUS | Status: AC
  Administered 2013-06-11: 1000 mL via INTRAVENOUS

## 2013-06-11 MED ORDER — SODIUM CHLORIDE 0.9 % IV SOLN
3.0000 g | Freq: Once | INTRAVENOUS | Status: AC
  Administered 2013-06-11: 3 g via INTRAVENOUS
  Filled 2013-06-11: qty 3

## 2013-06-11 MED ORDER — SULFAMETHOXAZOLE-TMP DS 800-160 MG PO TABS
1.0000 | ORAL_TABLET | Freq: Once | ORAL | Status: AC
  Administered 2013-06-11: 1 via ORAL
  Filled 2013-06-11: qty 1

## 2013-06-11 MED ORDER — SULFAMETHOXAZOLE-TRIMETHOPRIM 800-160 MG PO TABS: 1.0000 | ORAL_TABLET | Freq: Two times a day (BID) | ORAL | Status: AC

## 2013-06-11 NOTE — ED Notes (Signed)
Pt presents with redness, warmth, swelling to left foot. Pt has small wound to bottom of left foot which he first noticed 4 days ago. Pt has red streak from wound and across top of his left foot. Pt reports tenderness in area. Pt is diabetic and states his BG has been "higher than usual".

## 2013-06-11 NOTE — ED Provider Notes (Signed)
CSN: [id number]     Arrival date & time 06/11/13  1034 History   First MD Initiated Contact with Patient 06/11/13 1218     This chart was scribed for Christopher Gaskins, MD by Ellin Mayhew, ED Scribe. This patient was seen in room APA04/APA04 and the patient's care was started at 12:23 PM.  Patient gave verbal permission to utilize photo for medical documentation only The image was not stored on any personal device  Chief Complaint  Patient presents with  . Wound Check   Patient is a 37 y.o. male presenting with wound check. The history is provided by the patient. No language interpreter was used.  Wound Check This is a new problem. The current episode started more than 2 days ago. The problem occurs constantly. The problem has been gradually worsening. Pertinent negatives include no chest pain and no shortness of breath.   HPI Comments: Christopher Raymond is a 37 y.o. male who presents to the Emergency Department complaining of a L foot wound infection with associated pain to the area. Patient noticed a wound to the bottom of his left foot four days ago with some red streaking. Patient denies any trauma or walking barefoot. He has been nauseous and vomited yellow emesis in the morning. Patient denies any fever or chills. Patient has a history of diabetes.   PCP is Dr. Willa Frater in [place].  Past Medical History  Diagnosis Date  . GERD (gastroesophageal reflux disease)   . Diabetes mellitus   . HTN (hypertension)   . Depression   . Anxiety   . Chronic pain   . ED (erectile dysfunction)   . Hyperlipidemia    Past Surgical History  Procedure Laterality Date  . Knee surgery    . Back surgery    . Cholecystectomy  2009  . Tonsillectomy    . Cholecystectomy open     Family History  Problem Relation Age of Onset  . Coronary artery disease Father   . Heart attack Father    History  Substance Use Topics  . Smoking status: Never Smoker   . Smokeless tobacco: Current User     Types: Snuff     Comment: dip x 20  . Alcohol Use: Yes     Comment: very little 4-5 beers/yr    Review of Systems  Constitutional: Negative for fever and chills.  Respiratory: Negative for shortness of breath.   Cardiovascular: Negative for chest pain.  Gastrointestinal: Positive for nausea and vomiting.  Musculoskeletal:       L foot pain around area of wound.  Skin: Positive for wound (Bottom of left foot.).       Red streaking on top of L foot  Neurological: Negative for weakness.  All other systems reviewed and are negative.    Allergies  Review of patient's allergies indicates no known allergies.  Home Medications   Current Outpatient Rx  Name  Route  Sig  Dispense  Refill  . escitalopram (LEXAPRO) 10 MG tablet   Oral   Take 1 tablet (10 mg total) by mouth daily.   30 tablet   5   . esomeprazole (NEXIUM) 40 MG capsule   Oral   Take 1 capsule (40 mg total) by mouth daily before breakfast.   30 capsule   0   . insulin NPH-insulin regular (NOVOLIN 70/30) (70-30) 100 UNIT/ML injection   Subcutaneous   Inject 50-84 Units into the skin 2 (two) times daily with a meal. 84 units  in the am and then 50 units in the evening   10 mL   5   . lisinopril (PRINIVIL,ZESTRIL) 20 MG tablet   Oral   Take 1 tablet (20 mg total) by mouth daily.   30 tablet   5   . pioglitazone-metformin (ACTOPLUS MET) 15-850 MG per tablet   Oral   Take 1 tablet by mouth 2 (two) times daily with a meal.          Triage Vitals: BP 178/103  Pulse 105  Temp(Src) 98.3 F (36.8 C) (Oral)  Resp 18  Ht 6' (1.829 m)  Wt 275 lb (124.739 kg)  BMI 37.29 kg/m2  SpO2 99%  Physical Exam  Nursing note and vitals reviewed. CONSTITUTIONAL: Well developed/well nourished HEAD: Normocephalic/atraumatic EYES: EOMI/PERRL ENMT: Mucous membranes moist NECK: supple no meningeal signs CV: S1/S2 noted, no murmurs/rubs/gallops noted LUNGS: Lungs are clear to auscultation bilaterally, no apparent  distress ABDOMEN: soft, nontender, no rebound or guarding GU:no cva tenderness NEURO: Pt is awake/alert, moves all extremitiesx4 EXTREMITIES: pulses normal, full ROM, see photo below SKIN: warm, color normal PSYCH: no abnormalities of mood noted        ED Course  Procedures (including critical care time)  Medications - No data to display  DIAGNOSTIC STUDIES: Oxygen Saturation is 99% on room air, normal by my interpretation.    COORDINATION OF CARE: 12:26 PM-Discussed treatment plan which includes antibiotics and pain medication with pt at bedside and pt agreed to plan.   Pt felt improved Imaging unremarkable.  I doubt deep space infection, I doubt osteomyelitis He is well appearing.  He was give bactrim and unasyn for broad coverage.  I advised him to return in 48 hours for would recheck (he has no local PCP at this time) I also advised need for better control of his glucose We discussed strict return precautions    Labs Review Labs Reviewed  CBC WITH DIFFERENTIAL  BASIC METABOLIC PANEL   Imaging Review No results found.  EKG Interpretation   None       MDM  No diagnosis found. Nursing notes including past medical history and social history reviewed and considered in documentation xrays reviewed and considered Labs/vital reviewed and considered   I personally performed the services described in this documentation, which was scribed in my presence. The recorded information has been reviewed and is accurate.       Christopher Gaskins, MD 06/11/13 [phone]

## 2013-06-11 NOTE — ED Notes (Addendum)
Pt reports wound to left outer foot for 2-3 days, has been draining and now red streaks up his foot. Denies any fever. No odor. +nausea and vomiting. Quarter size area noted. Black center w/ serosan. Drainage.  bs has been high 230's at home.

## 2013-06-19 ENCOUNTER — Encounter (HOSPITAL_COMMUNITY): Payer: Self-pay | Admitting: Emergency Medicine

## 2013-06-19 ENCOUNTER — Inpatient Hospital Stay (HOSPITAL_COMMUNITY)
Admission: EM | Admit: 2013-06-19 | Discharge: 2013-06-22 | DRG: 638 | Disposition: A | Discharge status: Home / Self Care | Payer: Medicare Other | Attending: Family Medicine | Admitting: Family Medicine

## 2013-06-19 ENCOUNTER — Emergency Department (HOSPITAL_COMMUNITY): Payer: Medicare Other

## 2013-06-19 DIAGNOSIS — K219 Gastro-esophageal reflux disease without esophagitis: Secondary | ICD-10-CM | POA: Diagnosis present

## 2013-06-19 DIAGNOSIS — E119 Type 2 diabetes mellitus without complications: Secondary | ICD-10-CM

## 2013-06-19 DIAGNOSIS — E876 Hypokalemia: Secondary | ICD-10-CM | POA: Diagnosis present

## 2013-06-19 DIAGNOSIS — F411 Generalized anxiety disorder: Secondary | ICD-10-CM | POA: Diagnosis present

## 2013-06-19 DIAGNOSIS — L97509 Non-pressure chronic ulcer of other part of unspecified foot with unspecified severity: Secondary | ICD-10-CM | POA: Diagnosis present

## 2013-06-19 DIAGNOSIS — N058 Unspecified nephritic syndrome with other morphologic changes: Secondary | ICD-10-CM | POA: Diagnosis present

## 2013-06-19 DIAGNOSIS — E785 Hyperlipidemia, unspecified: Secondary | ICD-10-CM | POA: Diagnosis present

## 2013-06-19 DIAGNOSIS — L02619 Cutaneous abscess of unspecified foot: Secondary | ICD-10-CM | POA: Diagnosis present

## 2013-06-19 DIAGNOSIS — E1165 Type 2 diabetes mellitus with hyperglycemia: Secondary | ICD-10-CM | POA: Diagnosis present

## 2013-06-19 DIAGNOSIS — Z8249 Family history of ischemic heart disease and other diseases of the circulatory system: Secondary | ICD-10-CM

## 2013-06-19 DIAGNOSIS — F319 Bipolar disorder, unspecified: Secondary | ICD-10-CM | POA: Diagnosis present

## 2013-06-19 DIAGNOSIS — IMO0002 Reserved for concepts with insufficient information to code with codable children: Principal | ICD-10-CM | POA: Diagnosis present

## 2013-06-19 DIAGNOSIS — I10 Essential (primary) hypertension: Secondary | ICD-10-CM | POA: Diagnosis present

## 2013-06-19 DIAGNOSIS — M545 Low back pain, unspecified: Secondary | ICD-10-CM | POA: Diagnosis present

## 2013-06-19 DIAGNOSIS — G8929 Other chronic pain: Secondary | ICD-10-CM | POA: Diagnosis present

## 2013-06-19 DIAGNOSIS — F17200 Nicotine dependence, unspecified, uncomplicated: Secondary | ICD-10-CM | POA: Diagnosis present

## 2013-06-19 DIAGNOSIS — Z794 Long term (current) use of insulin: Secondary | ICD-10-CM

## 2013-06-19 DIAGNOSIS — E11628 Type 2 diabetes mellitus with other skin complications: Secondary | ICD-10-CM | POA: Diagnosis present

## 2013-06-19 DIAGNOSIS — L0390 Cellulitis, unspecified: Secondary | ICD-10-CM | POA: Diagnosis present

## 2013-06-19 DIAGNOSIS — E1129 Type 2 diabetes mellitus with other diabetic kidney complication: Secondary | ICD-10-CM | POA: Diagnosis present

## 2013-06-19 LAB — CBC WITH DIFFERENTIAL/PLATELET
Basophils Absolute: 0 10*3/uL (ref 0.0–0.1)
Basophils Relative: 1 % (ref 0–1)
Eosinophils Relative: 3 % (ref 0–5)
Hemoglobin: 13.2 g/dL (ref 13.0–17.0)
Lymphocytes Relative: 35 % (ref 12–46)
Lymphs Abs: 1.5 10*3/uL (ref 0.7–4.0)
MCHC: 33.5 g/dL (ref 30.0–36.0)
Monocytes Absolute: 0.2 10*3/uL (ref 0.1–1.0)
Monocytes Relative: 4 % (ref 3–12)
Neutro Abs: 2.5 10*3/uL (ref 1.7–7.7)
Neutrophils Relative %: 57 % (ref 43–77)
RDW: 13.5 % (ref 11.5–15.5)
WBC: 4.4 10*3/uL (ref 4.0–10.5)

## 2013-06-19 MED ORDER — MORPHINE SULFATE 4 MG/ML IJ SOLN
4.0000 mg | Freq: Once | INTRAMUSCULAR | Status: AC
  Administered 2013-06-19: 4 mg via INTRAVENOUS
  Filled 2013-06-19: qty 1

## 2013-06-19 MED ORDER — PIPERACILLIN-TAZOBACTAM 3.375 G IVPB
3.3750 g | Freq: Three times a day (TID) | INTRAVENOUS | Status: DC
  Administered 2013-06-20: 3.375 g via INTRAVENOUS
  Filled 2013-06-19 (×5): qty 50

## 2013-06-19 MED ORDER — SODIUM CHLORIDE 0.9 % IV BOLUS (SEPSIS)
1000.0000 mL | Freq: Once | INTRAVENOUS | Status: AC
  Administered 2013-06-19: 1000 mL via INTRAVENOUS

## 2013-06-19 MED ORDER — VANCOMYCIN HCL 500 MG IV SOLR
500.0000 mg | Freq: Once | INTRAVENOUS | Status: AC
  Administered 2013-06-20: 500 mg via INTRAVENOUS
  Filled 2013-06-19: qty 500

## 2013-06-19 MED ORDER — VANCOMYCIN HCL IN DEXTROSE 1-5 GM/200ML-% IV SOLN
1000.0000 mg | Freq: Once | INTRAVENOUS | Status: AC
  Administered 2013-06-20: 1000 mg via INTRAVENOUS
  Filled 2013-06-19: qty 200

## 2013-06-19 NOTE — ED Provider Notes (Signed)
CSN: [id number]     Arrival date & time 06/19/13  2007 History   First MD Initiated Contact with Patient 06/19/13 2252     Chief Complaint  Patient presents with  . Foot Ulcer   (Consider location/radiation/quality/duration/timing/severity/associated sxs/prior Treatment) HPI Comments: Patient presents to the emergency department with chief complaint of infection an ulcer on his left foot. He is diabetic. He states that he noticed the ulcer a couple of weeks ago. He states that he is been taking antibiotics at home, but the infection is not improving. He states that it is very painful to palpation and when he walks. He states that he is noticed redness streaking up the foot over the past couple of days. He denies fevers or chills. Denies any allergies. Nothing makes his symptoms better or worse.  The history is provided by the patient. No language interpreter was used.    Past Medical History  Diagnosis Date  . GERD (gastroesophageal reflux disease)   . Diabetes mellitus   . HTN (hypertension)   . Depression   . Anxiety   . Chronic pain   . ED (erectile dysfunction)   . Hyperlipidemia    Past Surgical History  Procedure Laterality Date  . Knee surgery    . Back surgery    . Cholecystectomy  2009  . Tonsillectomy    . Cholecystectomy open     Family History  Problem Relation Age of Onset  . Coronary artery disease Father   . Heart attack Father    History  Substance Use Topics  . Smoking status: Never Smoker   . Smokeless tobacco: Current User    Types: Snuff     Comment: dip x 20  . Alcohol Use: Yes     Comment: very little 4-5 beers/yr    Review of Systems  All other systems reviewed and are negative.    Allergies  Review of patient's allergies indicates no known allergies.  Home Medications   Current Outpatient Rx  Name  Route  Sig  Dispense  Refill  . amoxicillin-clavulanate (AUGMENTIN) 875-125 MG per tablet   Oral   Take 1 tablet by mouth 2 (two) times  daily. One po bid x 7 days   14 tablet   0   . escitalopram (LEXAPRO) 10 MG tablet   Oral   Take 1 tablet (10 mg total) by mouth daily.   30 tablet   5   . esomeprazole (NEXIUM) 40 MG capsule   Oral   Take 1 capsule (40 mg total) by mouth daily before breakfast.   30 capsule   0   . insulin NPH-insulin regular (NOVOLIN 70/30) (70-30) 100 UNIT/ML injection   Subcutaneous   Inject 50-84 Units into the skin 2 (two) times daily with a meal. 84 units in the am and then 50 units in the evening   10 mL   5   . lisinopril (PRINIVIL,ZESTRIL) 20 MG tablet   Oral   Take 1 tablet (20 mg total) by mouth daily.   30 tablet   5   . pioglitazone-metformin (ACTOPLUS MET) 15-850 MG per tablet   Oral   Take 1 tablet by mouth 2 (two) times daily with a meal.         . oxyCODONE-acetaminophen (PERCOCET/ROXICET) 5-325 MG per tablet   Oral   Take 1 tablet by mouth every 4 (four) hours as needed for severe pain.   15 tablet   0    BP 164/105  Pulse 113  Temp(Src) 98.5 F (36.9 C) (Oral)  Resp 20  Ht 6\' 1"  (1.854 m)  Wt 275 lb (124.739 kg)  BMI 36.29 kg/m2  SpO2 99% Physical Exam  Nursing note and vitals reviewed. Constitutional: He is oriented to person, place, and time. He appears well-developed and well-nourished.  HENT:  Head: Normocephalic and atraumatic.  Eyes: Conjunctivae and EOM are normal.  Neck: Normal range of motion.  Cardiovascular: Normal rate.   Pulmonary/Chest: Effort normal.  Abdominal: He exhibits no distension.  Musculoskeletal: Normal range of motion.  Neurological: He is alert and oriented to person, place, and time.  Skin: Skin is dry.  0.5 cm shallow ulcer to the left foot near the fifth toe, there is some streaking on the dorsal aspect of the foot, but does not reach the ankle  Psychiatric: He has a normal mood and affect. His behavior is normal. Judgment and thought content normal.    ED Course  Procedures (including critical care time) Results  for orders placed during the hospital encounter of 06/19/13  CBC WITH DIFFERENTIAL      Result Value Range   WBC 4.4  4.0 - 10.5 K/uL   RBC 4.69  4.22 - 5.81 MIL/uL   Hemoglobin 13.2  13.0 - 17.0 g/dL   HCT 16.1  09.6 - 04.5 %   MCV 84.0  78.0 - 100.0 fL   MCH 28.1  26.0 - 34.0 pg   MCHC 33.5  30.0 - 36.0 g/dL   RDW 40.9  81.1 - 91.4 %   Platelets 242  150 - 400 K/uL   Neutrophils Relative % 57  43 - 77 %   Neutro Abs 2.5  1.7 - 7.7 K/uL   Lymphocytes Relative 35  12 - 46 %   Lymphs Abs 1.5  0.7 - 4.0 K/uL   Monocytes Relative 4  3 - 12 %   Monocytes Absolute 0.2  0.1 - 1.0 K/uL   Eosinophils Relative 3  0 - 5 %   Eosinophils Absolute 0.1  0.0 - 0.7 K/uL   Basophils Relative 1  0 - 1 %   Basophils Absolute 0.0  0.0 - 0.1 K/uL  BASIC METABOLIC PANEL      Result Value Range   Sodium 138  135 - 145 mEq/L   Potassium 3.1 (*) 3.5 - 5.1 mEq/L   Chloride 99  96 - 112 mEq/L   CO2 29  19 - 32 mEq/L   Glucose, Bld 292 (*) 70 - 99 mg/dL   BUN 8  6 - 23 mg/dL   Creatinine, Ser 7.82  0.50 - 1.35 mg/dL   Calcium 9.4  8.4 - 95.6 mg/dL   GFR calc non Af Amer >90  >90 mL/min   GFR calc Af Amer >90  >90 mL/min   Dg Foot Complete Left  06/20/2013   CLINICAL DATA:  Left foot pain. Ulceration at the lateral aspect of the left foot.  EXAM: LEFT FOOT - COMPLETE 3+ VIEW  COMPARISON:  Left foot radiographs performed 06/11/2013  FINDINGS: The known soft tissue ulceration is not well characterized on radiograph. No radiopaque foreign bodies are seen.  No osseous erosions are seen to suggest osteomyelitis. There is no evidence of fracture or dislocation. Visualized joint spaces are preserved. The subtalar joint is unremarkable in appearance.  IMPRESSION: Known soft tissue ulceration not well characterized on radiograph. No radiopaque foreign bodies seen. No osseous erosions seen to suggest osteomyelitis.   Electronically Signed  By: Roanna Raider M.D.   On: 06/20/2013 00:41   Dg Foot Complete  Left  06/11/2013   CLINICAL DATA:  Cellulitis  EXAM: LEFT FOOT - COMPLETE 3+ VIEW  COMPARISON:  None.  FINDINGS: Frontal, oblique, and lateral views were obtained. There is no fracture or dislocation. Joint spaces appear intact. No erosive change or bony destruction. There is a small inferior calcaneal spur. No demonstrable soft tissue abscess.  IMPRESSION: No fracture or dislocation. Joint spaces appear intact. No bony destruction. Small inferior calcaneal spur. No soft tissue abscess appreciable.   Electronically Signed   By: Bretta Bang M.D.   On: 06/11/2013 13:07     EKG Interpretation   None       MDM   1. Diabetic foot infection     Patient with probable cellulitis vs. diabetic foot infection. Will treat with IV antibiotics, consider admission for continued therapy. Will order a plain film of the foot to evaluate for any bony involvement.  12:55 AM Patient discussed with Dr. Preston Fleeting.  Will admit the patient for failed outpatient therapy.    Ordered blood cultures per Dr. Harriett Rush request.     Roxy Horseman, PA-C 06/20/13 [phone]

## 2013-06-19 NOTE — ED Notes (Signed)
I have been seen for a foot ulcer here a couple of weeks ago. It is not getting any better and it is draining per pt. Hurts to put pressure on that foot. Streaks across the top of my foot per pt.

## 2013-06-19 NOTE — Progress Notes (Signed)
ANTIBIOTIC CONSULT NOTE - INITIAL  Pharmacy Consult for Zosyn & Vancomycin Indication: left foot diabetic skin ulcer  No Known Allergies  Patient Measurements: Height: 6\' 1"  (185.4 cm) Weight: 275 lb (124.739 kg) IBW/kg (Calculated) : 79.9 Adjusted Body Weight: 95kg  Vital Signs: Temp: 98.5 F (36.9 C) (12/27 2011) Temp src: Oral (12/27 2011) BP: 164/105 mmHg (12/27 2011) Pulse Rate: 113 (12/27 2011) Intake/Output from previous day:   Intake/Output from this shift:    Labs: No results found for this basename: WBC, HGB, PLT, LABCREA, CREATININE,  in the last 72 hours Estimated CrCl for age and DM 65-90 ml/min No results found for this basename: VANCOTROUGH, VANCOPEAK, VANCORANDOM, GENTTROUGH, GENTPEAK, GENTRANDOM, TOBRATROUGH, TOBRAPEAK, TOBRARND, AMIKACINPEAK, AMIKACINTROU, AMIKACIN,  in the last 72 hours   Microbiology: No results found for this or any previous visit (from the past 720 hour(s)).  Medical History: Past Medical History  Diagnosis Date  . GERD (gastroesophageal reflux disease)   . Diabetes mellitus   . HTN (hypertension)   . Depression   . Anxiety   . Chronic pain   . ED (erectile dysfunction)   . Hyperlipidemia     Medications:  Scheduled:   Infusions:  . [START ON 06/20/2013] piperacillin-tazobactam (ZOSYN)  IV    . [START ON 06/20/2013] vancomycin     Followed by  . [START ON 06/20/2013] vancomycin     PRN:   Assessment: 73yr large male with DM complications - to be treated for diabetic foot ulcer on left foot.  Patient had prescription for OP tx with Augmentin PTA.  No renal lab results yet.  Vancomycin & Zosyn to be started.  Goal of Therapy:  Desire vancomycin trough level 12-64mcg/ml.  Zosyn standard regimen to be started since est CrCl >55ml/min.  Plan:  1.  Start loading dose vancomycin 1500mg  IV x 1.  (Will probably follow with 1gm IV q12h depending on SCr result in am.) 2.  Zosyn 3.375gm IV q8h (4hr infusion) 3.  SCr ordered  for am.  Scarlett Presto 06/19/2013,11:45 PM

## 2013-06-20 ENCOUNTER — Encounter (HOSPITAL_COMMUNITY): Payer: Self-pay | Admitting: *Deleted

## 2013-06-20 DIAGNOSIS — L089 Local infection of the skin and subcutaneous tissue, unspecified: Secondary | ICD-10-CM

## 2013-06-20 DIAGNOSIS — E119 Type 2 diabetes mellitus without complications: Secondary | ICD-10-CM

## 2013-06-20 DIAGNOSIS — G8929 Other chronic pain: Secondary | ICD-10-CM

## 2013-06-20 DIAGNOSIS — I10 Essential (primary) hypertension: Secondary | ICD-10-CM

## 2013-06-20 DIAGNOSIS — M545 Low back pain: Secondary | ICD-10-CM

## 2013-06-20 DIAGNOSIS — E1169 Type 2 diabetes mellitus with other specified complication: Secondary | ICD-10-CM

## 2013-06-20 DIAGNOSIS — E11628 Type 2 diabetes mellitus with other skin complications: Secondary | ICD-10-CM | POA: Diagnosis present

## 2013-06-20 LAB — CBC
HCT: 38.1 % — ABNORMAL LOW (ref 39.0–52.0)
Hemoglobin: 12.7 g/dL — ABNORMAL LOW (ref 13.0–17.0)
MCV: 83.6 fL (ref 78.0–100.0)
RBC: 4.56 MIL/uL (ref 4.22–5.81)
WBC: 3.8 10*3/uL — ABNORMAL LOW (ref 4.0–10.5)

## 2013-06-20 LAB — BASIC METABOLIC PANEL
BUN: 8 mg/dL (ref 6–23)
CO2: 25 mEq/L (ref 19–32)
Chloride: 99 mEq/L (ref 96–112)
Chloride: 100 mEq/L (ref 96–112)
Creatinine, Ser: 0.77 mg/dL (ref 0.50–1.35)
GFR calc Af Amer: >90 mL/min (ref >90)
GFR calc Af Amer: >90 mL/min (ref >90)
Potassium: 3.1 mEq/L — ABNORMAL LOW (ref 3.5–5.1)
Potassium: 3.9 mEq/L (ref 3.5–5.1)
Sodium: 138 mEq/L (ref 135–145)

## 2013-06-20 LAB — GLUCOSE, CAPILLARY
Glucose-Capillary: 222 mg/dL — ABNORMAL HIGH (ref 70–99)
Glucose-Capillary: 120 mg/dL — ABNORMAL HIGH (ref 70–99)
Glucose-Capillary: 255 mg/dL — ABNORMAL HIGH (ref 70–99)

## 2013-06-20 LAB — HEPATIC FUNCTION PANEL
ALT: 16 U/L (ref 0–53)
AST: 16 U/L (ref 0–37)
Albumin: 3.7 g/dL (ref 3.5–5.2)
Alkaline Phosphatase: 62 U/L (ref 39–117)
Bilirubin, Direct: <0.1 mg/dL (ref 0.0–0.3)
Total Bilirubin: 0.4 mg/dL (ref 0.3–1.2)

## 2013-06-20 LAB — HEMOGLOBIN A1C: Mean Plasma Glucose: 275 mg/dL — ABNORMAL HIGH (ref <117)

## 2013-06-20 MED ORDER — DOCUSATE SODIUM 100 MG PO CAPS
100.0000 mg | ORAL_CAPSULE | Freq: Two times a day (BID) | ORAL | Status: DC
  Administered 2013-06-20 – 2013-06-22 (×5): 100 mg via ORAL
  Filled 2013-06-20 (×5): qty 1

## 2013-06-20 MED ORDER — INSULIN ASPART PROT & ASPART (70-30 MIX) 100 UNIT/ML ~~LOC~~ SUSP
50.0000 [IU] | Freq: Every day | SUBCUTANEOUS | Status: DC
  Administered 2013-06-20 – 2013-06-21 (×2): 50 [IU] via SUBCUTANEOUS
  Filled 2013-06-20: qty 10

## 2013-06-20 MED ORDER — INSULIN ASPART 100 UNIT/ML ~~LOC~~ SOLN: 0.0000 [IU] | Freq: Every day | SUBCUTANEOUS | Status: DC

## 2013-06-20 MED ORDER — POTASSIUM CHLORIDE CRYS ER 20 MEQ PO TBCR
40.0000 meq | EXTENDED_RELEASE_TABLET | Freq: Two times a day (BID) | ORAL | Status: DC
  Administered 2013-06-20 – 2013-06-22 (×5): 40 meq via ORAL
  Filled 2013-06-20 (×5): qty 2

## 2013-06-20 MED ORDER — ENOXAPARIN SODIUM 60 MG/0.6ML ~~LOC~~ SOLN
60.0000 mg | SUBCUTANEOUS | Status: DC
  Administered 2013-06-20 – 2013-06-22 (×3): 60 mg via SUBCUTANEOUS
  Filled 2013-06-20 (×3): qty 0.6

## 2013-06-20 MED ORDER — INSULIN ASPART PROT & ASPART (70-30 MIX) 100 UNIT/ML ~~LOC~~ SUSP
80.0000 [IU] | Freq: Every day | SUBCUTANEOUS | Status: DC
  Administered 2013-06-20 – 2013-06-22 (×3): 80 [IU] via SUBCUTANEOUS
  Filled 2013-06-20: qty 10

## 2013-06-20 MED ORDER — OXYCODONE HCL 5 MG PO TABS
5.0000 mg | ORAL_TABLET | ORAL | Status: DC | PRN
  Administered 2013-06-20 – 2013-06-22 (×13): 5 mg via ORAL
  Filled 2013-06-20 (×13): qty 1

## 2013-06-20 MED ORDER — SIMETHICONE 80 MG PO CHEW
80.0000 mg | CHEWABLE_TABLET | Freq: Four times a day (QID) | ORAL | Status: DC | PRN
  Administered 2013-06-20 – 2013-06-21 (×3): 80 mg via ORAL
  Filled 2013-06-20 (×3): qty 1

## 2013-06-20 MED ORDER — OXYCODONE-ACETAMINOPHEN 5-325 MG PO TABS
1.0000 | ORAL_TABLET | ORAL | Status: DC | PRN
  Administered 2013-06-20 – 2013-06-22 (×9): 1 via ORAL
  Filled 2013-06-20 (×10): qty 1

## 2013-06-20 MED ORDER — OXYCODONE-ACETAMINOPHEN 5-325 MG PO TABS
1.0000 | ORAL_TABLET | ORAL | Status: DC | PRN
  Administered 2013-06-20 (×4): 1 via ORAL
  Filled 2013-06-20 (×4): qty 1

## 2013-06-20 MED ORDER — INSULIN ASPART 100 UNIT/ML ~~LOC~~ SOLN
0.0000 [IU] | Freq: Three times a day (TID) | SUBCUTANEOUS | Status: DC
  Administered 2013-06-20 (×2): 5 [IU] via SUBCUTANEOUS
  Administered 2013-06-21: 3 [IU] via SUBCUTANEOUS
  Administered 2013-06-21: 5 [IU] via SUBCUTANEOUS
  Administered 2013-06-22: 3 [IU] via SUBCUTANEOUS
  Administered 2013-06-22: 2 [IU] via SUBCUTANEOUS

## 2013-06-20 MED ORDER — OXYCODONE HCL 5 MG PO TABS
5.0000 mg | ORAL_TABLET | Freq: Once | ORAL | Status: AC
  Administered 2013-06-20: 5 mg via ORAL
  Filled 2013-06-20: qty 1

## 2013-06-20 MED ORDER — ACETAMINOPHEN 650 MG RE SUPP: 650.0000 mg | Freq: Four times a day (QID) | RECTAL | Status: DC | PRN

## 2013-06-20 MED ORDER — PIPERACILLIN-TAZOBACTAM 3.375 G IVPB
3.3750 g | Freq: Three times a day (TID) | INTRAVENOUS | Status: DC
  Administered 2013-06-20 – 2013-06-21 (×5): 3.375 g via INTRAVENOUS
  Filled 2013-06-20 (×7): qty 50

## 2013-06-20 MED ORDER — POTASSIUM CHLORIDE 10 MEQ/100ML IV SOLN
10.0000 meq | INTRAVENOUS | Status: DC
  Administered 2013-06-20 (×4): 10 meq via INTRAVENOUS
  Filled 2013-06-20 (×4): qty 100

## 2013-06-20 MED ORDER — POTASSIUM CHLORIDE IN NACL 20-0.9 MEQ/L-% IV SOLN
INTRAVENOUS | Status: DC
  Administered 2013-06-20: 05:00:00 via INTRAVENOUS

## 2013-06-20 MED ORDER — PANTOPRAZOLE SODIUM 40 MG PO TBEC
40.0000 mg | DELAYED_RELEASE_TABLET | Freq: Every day | ORAL | Status: DC
  Administered 2013-06-20 – 2013-06-22 (×3): 40 mg via ORAL
  Filled 2013-06-20 (×3): qty 1

## 2013-06-20 MED ORDER — LISINOPRIL 10 MG PO TABS
20.0000 mg | ORAL_TABLET | Freq: Every day | ORAL | Status: DC
  Administered 2013-06-20 – 2013-06-22 (×3): 20 mg via ORAL
  Filled 2013-06-20 (×3): qty 2

## 2013-06-20 MED ORDER — ESCITALOPRAM OXALATE 10 MG PO TABS
10.0000 mg | ORAL_TABLET | Freq: Every day | ORAL | Status: DC
  Administered 2013-06-20 – 2013-06-22 (×3): 10 mg via ORAL
  Filled 2013-06-20 (×3): qty 1

## 2013-06-20 MED ORDER — ACETAMINOPHEN 325 MG PO TABS: 650.0000 mg | ORAL_TABLET | Freq: Four times a day (QID) | ORAL | Status: DC | PRN

## 2013-06-20 MED ORDER — SODIUM CHLORIDE 0.9 % IV SOLN
1250.0000 mg | Freq: Two times a day (BID) | INTRAVENOUS | Status: DC
  Administered 2013-06-20 – 2013-06-21 (×3): 1250 mg via INTRAVENOUS
  Filled 2013-06-20 (×6): qty 1250

## 2013-06-20 MED ORDER — VANCOMYCIN HCL 500 MG IV SOLR
INTRAVENOUS | Status: AC
  Filled 2013-06-20: qty 500

## 2013-06-20 MED ORDER — MORPHINE SULFATE 4 MG/ML IJ SOLN
4.0000 mg | Freq: Once | INTRAMUSCULAR | Status: AC
  Administered 2013-06-20: 4 mg via INTRAVENOUS
  Filled 2013-06-20: qty 1

## 2013-06-20 MED ORDER — POLYETHYLENE GLYCOL 3350 17 G PO PACK: 17.0000 g | PACK | Freq: Every day | ORAL | Status: DC | PRN

## 2013-06-20 NOTE — H&P (Signed)
[facility] History and Physical  [name] ZOX:[id number] DOB: August 08, 1975 DOA: 06/19/2013  Referring physician: ED  PCP: Pcp Not In System  Specialists: none  Chief Complaint: diabetic foot ulcer, infected  HPI: Christopher Raymond is a 37 y.o. male  This patient was evaluated in the emergency department today for a left lower extremity diabetic ulceration which is not healing. He was seen on December 19 for this same day and was put on oral antibiotics. He says he took Augmentin and also Bactrim. The ulceration is on the bottom surface of his foot and he says every time he steps on it it hurts. The pain has gotten worse and the ulcer has not healed. When he was seen on the 19th he said there was a long narrow red streak going up his leg from the ulceration. The streak is now about half as long but it is wider. She denies any constitutional symptoms such as malaise or fever. He has been diabetic for 8 years.  Review of Systems: The patient denies anorexia, fever, weight loss,, vision loss, decreased hearing, hoarseness, chest pain, syncope, dyspnea on exertion, peripheral edema, balance deficits, hemoptysis, abdominal pain, melena, hematochezia, severe indigestion/heartburn, hematuria, incontinence, genital sores, muscle weakness, suspicious skin lesions, transient blindness, difficulty walking, depression, unusual weight change, abnormal bleeding, enlarged lymph nodes, angioedema, and breast masses.    Past Medical History  Diagnosis Date  . GERD (gastroesophageal reflux disease)   . Diabetes mellitus   . HTN (hypertension)   . Depression   . Anxiety   . Chronic pain   . ED (erectile dysfunction)   . Hyperlipidemia    Past Surgical History  Procedure Laterality Date  . Knee surgery    . Back surgery    . Cholecystectomy  2009  . Tonsillectomy    . Cholecystectomy open     Social History:  reports that he has never smoked. His smokeless tobacco use includes Snuff. He  reports that he drinks alcohol. He reports that he does not use illicit drugs. Lives at home with girlfriend, able to do all ADLs  No Known Allergies  Family History  Problem Relation Age of Onset  . Coronary artery disease Father   . Heart attack Father     Prior to Admission medications   Medication Sig Start Date End Date Taking? Authorizing Provider  escitalopram (LEXAPRO) 10 MG tablet Take 1 tablet (10 mg total) by mouth daily. 10/23/12  Yes Merlyn Albert, MD  esomeprazole (NEXIUM) 40 MG capsule Take 1 capsule (40 mg total) by mouth daily before breakfast. 03/30/13  Yes Loren Racer, MD  insulin NPH-insulin regular (NOVOLIN 70/30) (70-30) 100 UNIT/ML injection Inject 50-84 Units into the skin 2 (two) times daily with a meal. 84 units in the am and then 50 units in the evening 09/18/12  Yes Merlyn Albert, MD  lisinopril (PRINIVIL,ZESTRIL) 20 MG tablet Take 1 tablet (20 mg total) by mouth daily. 10/23/12  Yes Merlyn Albert, MD  pioglitazone-metformin (ACTOPLUS MET) [phone] MG per tablet Take 1 tablet by mouth 2 (two) times daily with a meal.   Yes Historical Provider, MD  oxyCODONE-acetaminophen (PERCOCET/ROXICET) 5-325 MG per tablet Take 1 tablet by mouth every 4 (four) hours as needed for severe pain. 06/11/13   [name], MD   Physical Exam: Filed Vitals:   06/20/13 0551  BP: 141/78  Pulse: 78  Temp: 98.2 F (36.8 C)  Resp: 20    Nursing note and vitals  reviewed. Constitutional: He is oriented to person, place, and time. He  appears well-developed and well-nourished. overweight.  HENT:  Mouth/Throat: Oropharynx is clear and moist. No oropharyngeal exudate.  Eyes: Conjunctivae are normal. Pupils are equal, round, and reactive to light.  Neck: Normal range of motion. Neck supple. No thyromegaly present.  Cardiovascular: Normal rate, regular rhythm and normal heart sounds.   Pulmonary/Chest: Effort normal and breath sounds normal.  Abdominal: Soft. Bowel sounds  are normal.  no distension. There is no tenderness. There is no rebound.  Lymphadenopathy:    He has no cervical adenopathy.  Neurological: He is alert and oriented to person, place, and time. He has normal reflexes.  Skin: Skin is warm and dry.He has no concerning moles. Quarter sized wound on left lateral plntar surface of foot.  Psychiatric: He has a normal mood and affect. His behavior is normal.   Labs on Admission:  Basic Metabolic Panel:  Recent Labs Lab 06/19/13 2322  NA 138  K 3.1*  CL 99  CO2 29  GLUCOSE 292*  BUN 8  CREATININE 0.72  CALCIUM 9.4   Liver Function Tests: No results found for this basename: AST, ALT, ALKPHOS, BILITOT, PROT, ALBUMIN,  in the last 168 hours No results found for this basename: LIPASE, AMYLASE,  in the last 168 hours No results found for this basename: AMMONIA,  in the last 168 hours CBC:  Recent Labs Lab 06/19/13 2322  WBC 4.4  NEUTROABS 2.5  HGB 13.2  HCT 39.4  MCV 84.0  PLT 242   Cardiac Enzymes: No results found for this basename: CKTOTAL, CKMB, CKMBINDEX, TROPONINI,  in the last 168 hours  BNP (last 3 results) No results found for this basename: PROBNP,  in the last 8760 hours CBG: No results found for this basename: GLUCAP,  in the last 168 hours  Radiological Exams on Admission: Dg Foot Complete Left  06/20/2013   CLINICAL DATA:  Left foot pain. Ulceration at the lateral aspect of the left foot.  EXAM: LEFT FOOT - COMPLETE 3+ VIEW  COMPARISON:  Left foot radiographs performed 06/11/2013  FINDINGS: The known soft tissue ulceration is not well characterized on radiograph. No radiopaque foreign bodies are seen.  No osseous erosions are seen to suggest osteomyelitis. There is no evidence of fracture or dislocation. Visualized joint spaces are preserved. The subtalar joint is unremarkable in appearance.  IMPRESSION: Known soft tissue ulceration not well characterized on radiograph. No radiopaque foreign bodies seen. No osseous  erosions seen to suggest osteomyelitis.   Electronically Signed   By: Roanna Raider M.D.   On: 06/20/2013 00:41      Assessment/Plan Active Problems:   Cellulitis   Diabetic foot infection GERD HTN DIABETES WITH COMPLICATIONS CHRONIC LOW BACK PAIN   1. Cellulitis: Secondary to diabetic foot infection. Continue vancomycin and Zosyn as started in the emergency department in followup of the blood cultures. May consider a wound care consult or follow up with wound care as an outpatient. 2. Diabetes type 2: A1c sliding scale continue his 7030 insulin because he says he has tried others and this works the best for him. I have ordered it as he says he takes it which is 80 units in the morning and 50 in the evening. 3. Chronic pain: We'll continue his Percocet that he is on as an outpatient. Patient understands when he is discharged we will not be giving him a lengthy supply of this medication 4. Hypokalemia: Replace and recheck  Code Status: FULL CODE Disposition Plan: GMF  Time spent: 51 MINUTES  Acey Lav [facility] Pager [phone]  If 7PM-7AM, please contact night-coverage [email or web address] Password [email or web address] 06/20/2013, 6:30 AM

## 2013-06-20 NOTE — Progress Notes (Signed)
Utilization Review completed.  

## 2013-06-20 NOTE — Progress Notes (Addendum)
4:34 PM I agree with HPI/GPe and A/P per Dr. Lucretia Roers.     patient is a known diabetic for the past 6 years type II who was wearing his work until 3 weeks ago and then developed a wound over the left lateral area of the heel.  He states that he came to the emergency room on the 19th and was given antibiotics to cover this in addition to that he had some nausea and vomiting  he was given Bactrim and Unasyn for broad coverage and when he returned to the emergency room 12/27,  It was noted that his cellulitis was worse.  Foot x-ray did not show evidence is of osteomyelitis    in addition to Dr. Joseph Art  Excellent exam, patient has fine touch sensation and temperature discrimination between both legs as well as bounding dorsalis pedis bilaterally  Patient Active Problem List   Diagnosis Date Noted  . Diabetic foot infection 06/20/2013  . Essential hypertension, benign 09/18/2012  . Diabetes 09/18/2012  . Chronic low back pain 09/18/2012  . Cellulitis 09/18/2012  . Rectal bleed 01/22/2011  . GERD (gastroesophageal reflux disease) 01/22/2011  . Vomiting 01/22/2011        continue IV vancomycin and Zosyn for polymicrobial diabetic foot wound  low threshold to MRI foot-usually plain film will show only chronic osteomyelitis of greater than 6 duration- if clinically not better,  Should get ESR and MRI Diabetes control stressed to patient in addition to wearing proper diabetic shoes- he has an appointment  12/29 with wound care specialist which I asked his mother to postpone for 10 days  Pleas Koch, MD [facility] [phone]

## 2013-06-20 NOTE — ED Provider Notes (Signed)
Medical screening examination/treatment/procedure(s) were performed by non-physician practitioner and as supervising physician I was immediately available for consultation/collaboration.   Wendolyn Raso, MD 06/20/13 0822 

## 2013-06-20 NOTE — Progress Notes (Signed)
ANTIBIOTIC CONSULT NOTE - FOLLOW UP  Pharmacy Consult for Zosyn & Vancomycin Indication: left foot diabetic skin ulcer  No Known Allergies  Patient Measurements: Height: 6\' 1"  (185.4 cm) Weight: 285 lb 0.9 oz (129.3 kg) IBW/kg (Calculated) : 79.9 Adjusted Body Weight:   Vital Signs: Temp: 98.2 F (36.8 C) (12/28 0551) Temp src: Oral (12/28 0551) BP: 141/78 mmHg (12/28 0551) Pulse Rate: 78 (12/28 0551) Intake/Output from previous day:   Intake/Output from this shift:    Labs:  Recent Labs  06/19/13 2322 06/20/13 1009  WBC 4.4 3.8*  HGB 13.2 12.7*  PLT 242 225  CREATININE 0.72 0.77   Estimated Creatinine Clearance: 178.3 ml/min (by C-G formula based on Cr of 0.77). No results found for this basename: VANCOTROUGH, Leodis Binet, VANCORANDOM, GENTTROUGH, GENTPEAK, GENTRANDOM, TOBRATROUGH, TOBRAPEAK, TOBRARND, AMIKACINPEAK, AMIKACINTROU, AMIKACIN,  in the last 72 hours   Microbiology: Recent Results (from the past 720 hour(s))  CULTURE, BLOOD (ROUTINE X 2)     Status: None   Collection Time    06/20/13  2:48 AM      Result Value Range Status   Specimen Description Blood LEFT ANTECUBITAL   Final   Special Requests BOTTLES DRAWN AEROBIC AND ANAEROBIC 6CC   Final   Culture NO GROWTH <24 HRS   Final   Report Status PENDING   Incomplete  CULTURE, BLOOD (ROUTINE X 2)     Status: None   Collection Time    06/20/13  2:52 AM      Result Value Range Status   Specimen Description Blood BLOOD RIGHT HAND   Final   Special Requests BOTTLES DRAWN AEROBIC AND ANAEROBIC 6CC   Final   Culture NO GROWTH <24 HRS   Final   Report Status PENDING   Incomplete    Anti-infectives   Start     Dose/Rate Route Frequency Ordered Stop   06/20/13 1200  vancomycin (VANCOCIN) 1,250 mg in sodium chloride 0.9 % 250 mL IVPB     1,250 mg 166.7 mL/hr over 90 Minutes Intravenous Every 12 hours 06/20/13 0758     06/20/13 0900  piperacillin-tazobactam (ZOSYN) IVPB 3.375 g     3.375 g 12.5 mL/hr over  240 Minutes Intravenous Every 8 hours 06/20/13 0734     06/20/13 0100  vancomycin (VANCOCIN) 500 mg in sodium chloride 0.9 % 100 mL IVPB     500 mg 100 mL/hr over 60 Minutes Intravenous  Once 06/19/13 2349 06/20/13 0308   06/20/13 0000  vancomycin (VANCOCIN) IVPB 1000 mg/200 mL premix     1,000 mg 200 mL/hr over 60 Minutes Intravenous  Once 06/19/13 2349 06/20/13 0141   06/20/13 0000  piperacillin-tazobactam (ZOSYN) IVPB 3.375 g  Status:  Discontinued     3.375 g 12.5 mL/hr over 240 Minutes Intravenous Every 8 hours 06/19/13 2350 06/20/13 0734      Assessment: 21yr obese male with DM complications to be treated for diabetic foot ulcer on left foot. Patient had OP tx with Augmentin PTA Zosyn 3.375 GM IV every 8 hours started last evening Vancomycin 1500 mg IV loading dose given last evening  Goal of Therapy:  Vancomycin trough level 10-15 mcg/ml  Plan:  Vancomycin 1250 mg IV every 12 hours Continue Zosyn 3.375 GM IV every 8 hours, each dose over 4 hours Vancomycin trough at steady state Labs per protocol  Raquel James, Lionel Woodberry Bennett 06/20/2013,10:56 AM

## 2013-06-21 LAB — CBC WITH DIFFERENTIAL/PLATELET
Basophils Relative: 1 % (ref 0–1)
Lymphocytes Relative: 41 % (ref 12–46)
Lymphs Abs: 1.7 10*3/uL (ref 0.7–4.0)
MCHC: 33.8 g/dL (ref 30.0–36.0)
Monocytes Absolute: 0.3 10*3/uL (ref 0.1–1.0)
Neutrophils Relative %: 46 % (ref 43–77)
Platelets: 204 10*3/uL (ref 150–400)
RBC: 4.22 MIL/uL (ref 4.22–5.81)
WBC: 4.1 10*3/uL (ref 4.0–10.5)

## 2013-06-21 LAB — GLUCOSE, CAPILLARY
Glucose-Capillary: 202 mg/dL — ABNORMAL HIGH (ref 70–99)
Glucose-Capillary: 97 mg/dL (ref 70–99)
Glucose-Capillary: 176 mg/dL — ABNORMAL HIGH (ref 70–99)
Glucose-Capillary: 90 mg/dL (ref 70–99)

## 2013-06-21 MED ORDER — LEVOFLOXACIN 500 MG PO TABS
500.0000 mg | ORAL_TABLET | Freq: Every day | ORAL | Status: DC
  Administered 2013-06-21 – 2013-06-22 (×2): 500 mg via ORAL
  Filled 2013-06-21 (×2): qty 1

## 2013-06-21 MED ORDER — CLINDAMYCIN HCL 150 MG PO CAPS
300.0000 mg | ORAL_CAPSULE | Freq: Three times a day (TID) | ORAL | Status: DC
  Administered 2013-06-21 – 2013-06-22 (×2): 300 mg via ORAL
  Filled 2013-06-21 (×2): qty 2

## 2013-06-21 NOTE — Progress Notes (Signed)
Christopher Raymond ZOX:[id number] DOB: 25-Oct-1975 DOA: 06/19/2013 PCP: Pcp Not In System  Brief narrative: Patient is a known diabetic for the past 6 years type II who was wearing his work until 3 weeks ago and then developed a wound over the left lateral area of the heel.  He states that he came to the emergency room on the 19th and was given antibiotics to cover this in addition to that he had some nausea and vomiting  he was given Bactrim and Unasyn for broad coverage and when he returned to the emergency room 12/27, It was noted that his cellulitis was worse.  Foot x-ray did not show evidence is of osteomyelitis   Past medical history-As per Problem list Chart reviewed as below- Reviewed  Consultants:  None  Procedures:  Foot x-ray   Antibiotics:  Vancomycin 12/27 stop  Zosyn 12/27 stop  Clindamycin 12/29  Levaquin 12/29   Subjective  Feels fair, no pain or discomfort in foot other than when walking No fevers no chills Tolerating diet Discussed with diabetes nurse    Objective    Interim History: None  Telemetry: None   Objective: Filed Vitals:   06/20/13 1426 06/20/13 2100 06/21/13 1038 06/21/13 1512  BP: 139/79 169/94 164/98 141/95  Pulse: 89 76 83 97  Temp: 98 F (36.7 C) 98.3 F (36.8 C)  98.3 F (36.8 C)  TempSrc: Oral Oral Oral   Resp: 20   20  Height:      Weight:      SpO2: 98% 98% 98% 100%    Intake/Output Summary (Last 24 hours) at 06/21/13 1821 Last data filed at 06/21/13 0900  Gross per 24 hour  Intake    240 ml  Output      0 ml  Net    240 ml    Exam:  General: EOMI NCAT Cardiovascular: S1-S2 no murmur rub or gallop Respiratory: Clear Abdomen: Soft nontender Skin wound examined today Neuro intact  Data Reviewed: Basic Metabolic Panel:  Recent Labs Lab 06/19/13 2322 06/20/13 1009  NA 138 135  K 3.1* 3.9  CL 99 100  CO2 29 25  GLUCOSE 292* 294*  BUN 8 7  CREATININE 0.72 0.77  CALCIUM 9.4 8.5   Liver  Function Tests:  Recent Labs Lab 06/20/13 1009  AST 16  ALT 16  ALKPHOS 62  BILITOT 0.4  PROT 7.0  ALBUMIN 3.7   No results found for this basename: LIPASE, AMYLASE,  in the last 168 hours No results found for this basename: AMMONIA,  in the last 168 hours CBC:  Recent Labs Lab 06/19/13 2322 06/20/13 1009 06/21/13 0856  WBC 4.4 3.8* 4.1  NEUTROABS 2.5  --  1.9  HGB 13.2 12.7* 11.9*  HCT 39.4 38.1* 35.2*  MCV 84.0 83.6 83.4  PLT 242 225 204   Cardiac Enzymes: No results found for this basename: CKTOTAL, CKMB, CKMBINDEX, TROPONINI,  in the last 168 hours BNP: No components found with this basename: POCBNP,  CBG:  Recent Labs Lab 06/20/13 1633 06/20/13 2218 06/21/13 0738 06/21/13 1217 06/21/13 1659  GLUCAP 255* 169* 202* 97 176*    Recent Results (from the past 240 hour(s))  CULTURE, BLOOD (ROUTINE X 2)     Status: None   Collection Time    06/20/13  2:48 AM      Result Value Range Status   Specimen Description Blood LEFT ANTECUBITAL   Final   Special Requests BOTTLES DRAWN AEROBIC AND ANAEROBIC 6CC  Final   Culture NO GROWTH 1 DAY   Final   Report Status PENDING   Incomplete  CULTURE, BLOOD (ROUTINE X 2)     Status: None   Collection Time    06/20/13  2:52 AM      Result Value Range Status   Specimen Description Blood BLOOD RIGHT HAND   Final   Special Requests BOTTLES DRAWN AEROBIC AND ANAEROBIC 6CC   Final   Culture NO GROWTH 1 DAY   Final   Report Status PENDING   Incomplete     Studies:              All Imaging reviewed and is as per above notation   Scheduled Meds: . clindamycin  300 mg Oral Q8H  . docusate sodium  100 mg Oral BID  . enoxaparin (LOVENOX) injection  60 mg Subcutaneous Q24H  . escitalopram  10 mg Oral Daily  . insulin aspart  0-15 Units Subcutaneous TID WC  . insulin aspart  0-5 Units Subcutaneous QHS  . insulin aspart protamine- aspart  50 Units Subcutaneous Q supper  . insulin aspart protamine- aspart  80 Units  Subcutaneous Q breakfast  . levofloxacin  500 mg Oral Daily  . lisinopril  20 mg Oral Daily  . pantoprazole  40 mg Oral Daily  . potassium chloride  40 mEq Oral BID   Continuous Infusions: . 0.9 % NaCl with KCl 20 mEq / L 10 mL/hr at 06/20/13 0505     Assessment/Plan: 1. Diabetic wound to foot-clinically improving. Narrow antibiotics to clindamycin/Levaquin, stop date 07/05/13-Actos on hold 2. Uncontrolled diabetes mellitus, A1c 11-appreciate diabetic coordinator input, continue meds as above moderate control 3. Diabetic nephropathy plus hypertension-continue lisinopril 20 daily 4. Bipolar/depression continue Lexapro 10 mg daily 5. Reflux continue Nexium 40 daily  Code Status: Full Family Communication: Discussed with wife at bedside Disposition Plan: Inpatient   Pleas Koch, MD  [facility] Pager [phone] 06/21/2013, 6:21 PM    LOS: 2 days

## 2013-06-21 NOTE — Care Management Note (Signed)
    Page 1 of 1   06/21/2013     3:14:17 PM   CARE MANAGEMENT NOTE 06/21/2013  Patient:  Christopher Raymond, HEGEMAN   Account Number:  [id number]  Date Initiated:  06/21/2013  Documentation initiated by:  Rosemary Holms  Subjective/Objective Assessment:   Pt admitted from home where he lives with his mom. If [facility] RN IV AB needed, mom can assist. Mom is an EMT.     Action/Plan:   Follow to see if pt will require HH IV AB   Anticipated DC Date:  06/23/2013   Anticipated DC Plan:  HOME W HOME HEALTH SERVICES      DC Planning Services  CM consult      Choice offered to / List presented to:             Status of service:  In process, will continue to follow Medicare Important Message given?   (If response is "NO", the following Medicare IM given date fields will be blank) Date Medicare IM given:   Date Additional Medicare IM given:    Discharge Disposition:    Per UR Regulation:    If discussed at Long Length of Stay Meetings, dates discussed:    Comments:  06/21/13 Rosemary Holms RN BSN CM

## 2013-06-21 NOTE — Progress Notes (Signed)
Spoke with patient about diabetes and home regimen for diabetes control. Patient reports that he takes Novolin 70/30 84 units in the am, Novolin 70/30 50 units in the evening, and ActoPlus Met 15-850 mg BID as an outpatient for diabetes management.  He states that his PCP helps him manage his diabetes.  Discussed A1C results (11.2% on 06/20/13) with patient and he reports that his last A1C about 2 months ago was 9.1%. Discussed importance of checking CBGs and maintaining good CBG control to prevent long-term and short-term complications. According to the patient he checks his blood glucose twice a day (first thing in the morning and in the evening before supper).  He reports that his blood sugar has been running higher over the past few weeks, in the 200-300's mg/dl.  Discussed impact of stress, sickness, nutrition, and exercise on diabetes control.  Stressed importance of diabetes control to promote wound healing and prevent complications associated with uncontrolled diabetes.  When inquiring about nutrition, patient reports that he does not really follow a diabetic diet and drinks regular sodas and sweet tea.  According to the patient he does not like the taste of diet drinks.  Discussed alternatives to diet sodas and encouraged patient to eliminate sugary drinks from diet which will decrease carbohydrate intake significantly.  Patient states that he will try to drink more water and unsweetened tea.  Informed patient about free outpatient diabetes education class here at [facility] and encouraged him to try to attend one of the classes.  Patient verbalized understanding of information discussed and states that he does not have any further questions at this time.  Will continue to follow as an inpatient. Thanks, Orlando Penner, RN, MSN, CCRN Diabetes Coordinator Inpatient Diabetes Program [phone] (Team Pager) [phone] (AP office) [phone] [facility] office)

## 2013-06-22 LAB — CBC
HCT: 34.2 % — ABNORMAL LOW (ref 39.0–52.0)
Platelets: 216 10*3/uL (ref 150–400)
RBC: 4.09 MIL/uL — ABNORMAL LOW (ref 4.22–5.81)
WBC: 4.4 10*3/uL (ref 4.0–10.5)

## 2013-06-22 LAB — GLUCOSE, CAPILLARY: Glucose-Capillary: 132 mg/dL — ABNORMAL HIGH (ref 70–99)

## 2013-06-22 MED ORDER — CLINDAMYCIN HCL 300 MG PO CAPS: 300.0000 mg | ORAL_CAPSULE | Freq: Three times a day (TID) | ORAL | Status: DC

## 2013-06-22 MED ORDER — OXYCODONE HCL 5 MG PO TABS: 5.0000 mg | ORAL_TABLET | ORAL | Status: DC | PRN

## 2013-06-22 MED ORDER — OXYCODONE-ACETAMINOPHEN 5-325 MG PO TABS: 1.0000 | ORAL_TABLET | ORAL | Status: DC | PRN

## 2013-06-22 NOTE — Progress Notes (Addendum)
Pt has a f/u appt with [facility]ound Clinic in [place] already arranged, pt states he will follow-up as scheduled.  Pt given list of MD's in the is area that are accepting new patients, because his current PCP in several hours away.  IV removed, site WNL. Pt given new prescriptions, discussed importance of antibiotic therapy, teachback completed.  Discussed other d/c instructions with pt, verbalizes understanding.  Pt is A&O and desires to go home.  Spoke with MD regarding use of pressure reducing boot, do not have boot available here.  Wound is not in a heavy pressure area on the foot- will f/u with wound clinic.

## 2013-06-22 NOTE — Discharge Summary (Signed)
Physician Discharge Summary  Christopher Raymond ZOX:[id number] DOB: 04-14-1976 DOA: 06/19/2013  PCP: Pcp Not In System  Admit date: 06/19/2013 Discharge date: 06/22/2013  Time spent:35 minutes  Recommendations for Outpatient Follow-up:  1. CBC in 5 days 2. Recommend strict diabetic control, weight loss, Vocational rehab 3. Complete course of clindamycin 07/05/13   Discharge Diagnoses:  Active Problems:   GERD (gastroesophageal reflux disease)   Essential hypertension, benign   Diabetes   Chronic low back pain   Cellulitis   Diabetic foot infection   Discharge Condition: Good Diet recommendation: diabetic   Filed Weights   06/19/13 2011 06/20/13 0214  Weight: 124.739 kg (275 lb) 129.3 kg (285 lb 0.9 oz)    History of present illness:  Patient is a known diabetic, disabled secondary to low back pain for the past 6 years type II who was wearing his work until 3 weeks ago and then developed a wound over the left lateral area of the heel.  He states that he came to the emergency room on the 19th and was given antibiotics to cover this in addition to that he had some nausea and vomiting  he was given Bactrim and Unasyn for broad coverage and when he returned to the emergency room 12/27, It was noted that his cellulitis was worse.  Foot x-ray did not show evidence is of osteomyelitis   Hospital Course:   1. Diabetic wound to foot-clinically improving.was initially on vancomycin and Zosyn and was narrowed  to clindamycin/Levaquin [Levaquin seemed to cause a rash in his lower back and has been listed as an allergy], stop date 07/05/13-Actos/metresumed on discharge.  Discussed patient's care with infectious disease physician who recommended clindamycin monotherapy 2. Uncontrolled diabetes mellitus, A1c 11-appreciate diabetic coordinator input, continue meds as above moderate control 3. Diabetic nephropathy plus hypertension-continue lisinopril 20 daily 4. Bipolar/depression continue  Lexapro 10 mg daily 5. Reflux continue Nexium 40 daily 6. Lower back pain-advised patient I can only prescribe limited amount of Percocet-his primary care physician is in [place] and I've advised him to followup with him for this  Consultants:  Discussed the case with infectious disease Dr. Luciana Axe 12/30 Procedures:  Foot x-ray  Antibiotics:  Vancomycin 12/27 stop  Zosyn 12/27 stop  Clindamycin 12/29-1/12/15 Levaquin 12/29-12.30   Discharge Exam: Filed Vitals:   06/22/13 0617  BP: 118/62  Pulse: 73  Temp: 97.4 F (36.3 C)  Resp: 18   Doing well tolerating diet No pain no fever no chills   General: alert pleasant oriented no fever no chills no rigor Cardiovascular: S1-S2 no murmur rub or gallop Respiratory: clinically clear  Discharge Instructions  Discharge Orders   Future Orders Complete By Expires   Diet - low sodium heart healthy  As directed    Discharge instructions  As directed    Comments:     Continue antibiotics until 07/05/13-may make sure you complete the course Followup with regular physician for  chronic pain issues-I have prescribed only 20 tablets of Percocet. Please get your foot looked at within the next 5-7 days If he developed fever about 101, swelling or pain in the left lower extremity seek medical attention immediately Please try to look into weight loss programs as well as occupational rehabilitation/vocational therapy   Increase activity slowly  As directed    Unna boot  As directed        Medication List    STOP taking these medications       amoxicillin-clavulanate 875-125 MG per tablet  Commonly known as:  AUGMENTIN      TAKE these medications       clindamycin 300 MG capsule  Commonly known as:  CLEOCIN  Take 1 capsule (300 mg total) by mouth every 8 (eight) hours.     escitalopram 10 MG tablet  Commonly known as:  LEXAPRO  Take 1 tablet (10 mg total) by mouth daily.     esomeprazole 40 MG capsule  Commonly known as:   NEXIUM  Take 1 capsule (40 mg total) by mouth daily before breakfast.     insulin NPH-regular (70-30) 100 UNIT/ML injection  Commonly known as:  NOVOLIN 70/30  Inject 50-84 Units into the skin 2 (two) times daily with a meal. 84 units in the am and then 50 units in the evening     lisinopril 20 MG tablet  Commonly known as:  PRINIVIL,ZESTRIL  Take 1 tablet (20 mg total) by mouth daily.     oxyCODONE-acetaminophen 5-325 MG per tablet  Commonly known as:  PERCOCET/ROXICET  Take 1 tablet by mouth every 4 (four) hours as needed for severe pain.     pioglitazone-metformin 15-850 MG per tablet  Commonly known as:  ACTOPLUS MET  Take 1 tablet by mouth 2 (two) times daily with a meal.       Allergies  Allergen Reactions  . Levaquin [Levofloxacin]     Rash on skin, not anaphylaxis       Follow-up Information   Follow up with Pcp Not In System.       The results of significant diagnostics from this hospitalization (including imaging, microbiology, ancillary and laboratory) are listed below for reference.    Significant Diagnostic Studies: Dg Foot Complete Left  06/20/2013   CLINICAL DATA:  Left foot pain. Ulceration at the lateral aspect of the left foot.  EXAM: LEFT FOOT - COMPLETE 3+ VIEW  COMPARISON:  Left foot radiographs performed 06/11/2013  FINDINGS: The known soft tissue ulceration is not well characterized on radiograph. No radiopaque foreign bodies are seen.  No osseous erosions are seen to suggest osteomyelitis. There is no evidence of fracture or dislocation. Visualized joint spaces are preserved. The subtalar joint is unremarkable in appearance.  IMPRESSION: Known soft tissue ulceration not well characterized on radiograph. No radiopaque foreign bodies seen. No osseous erosions seen to suggest osteomyelitis.   Electronically Signed   By: Roanna Raider M.D.   On: 06/20/2013 00:41   Dg Foot Complete Left  06/11/2013   CLINICAL DATA:  Cellulitis  EXAM: LEFT FOOT - COMPLETE  3+ VIEW  COMPARISON:  None.  FINDINGS: Frontal, oblique, and lateral views were obtained. There is no fracture or dislocation. Joint spaces appear intact. No erosive change or bony destruction. There is a small inferior calcaneal spur. No demonstrable soft tissue abscess.  IMPRESSION: No fracture or dislocation. Joint spaces appear intact. No bony destruction. Small inferior calcaneal spur. No soft tissue abscess appreciable.   Electronically Signed   By: Bretta Bang M.D.   On: 06/11/2013 13:07    Microbiology: Recent Results (from the past 240 hour(s))  CULTURE, BLOOD (ROUTINE X 2)     Status: None   Collection Time    06/20/13  2:48 AM      Result Value Range Status   Specimen Description Blood LEFT ANTECUBITAL   Final   Special Requests BOTTLES DRAWN AEROBIC AND ANAEROBIC 6CC   Final   Culture NO GROWTH 1 DAY   Final   Report Status PENDING  Incomplete  CULTURE, BLOOD (ROUTINE X 2)     Status: None   Collection Time    06/20/13  2:52 AM      Result Value Range Status   Specimen Description Blood BLOOD RIGHT HAND   Final   Special Requests BOTTLES DRAWN AEROBIC AND ANAEROBIC 6CC   Final   Culture NO GROWTH 1 DAY   Final   Report Status PENDING   Incomplete     Labs: Basic Metabolic Panel:  Recent Labs Lab 06/19/13 2322 06/20/13 1009  NA 138 135  K 3.1* 3.9  CL 99 100  CO2 29 25  GLUCOSE 292* 294*  BUN 8 7  CREATININE 0.72 0.77  CALCIUM 9.4 8.5   Liver Function Tests:  Recent Labs Lab 06/20/13 1009  AST 16  ALT 16  ALKPHOS 62  BILITOT 0.4  PROT 7.0  ALBUMIN 3.7   No results found for this basename: LIPASE, AMYLASE,  in the last 168 hours No results found for this basename: AMMONIA,  in the last 168 hours CBC:  Recent Labs Lab 06/19/13 2322 06/20/13 1009 06/21/13 0856 06/22/13 0539  WBC 4.4 3.8* 4.1 4.4  NEUTROABS 2.5  --  1.9  --   HGB 13.2 12.7* 11.9* 11.4*  HCT 39.4 38.1* 35.2* 34.2*  MCV 84.0 83.6 83.4 83.6  PLT 242 225 204 216   Cardiac  Enzymes: No results found for this basename: CKTOTAL, CKMB, CKMBINDEX, TROPONINI,  in the last 168 hours BNP: BNP (last 3 results) No results found for this basename: PROBNP,  in the last 8760 hours CBG:  Recent Labs Lab 06/21/13 0738 06/21/13 1217 06/21/13 1659 06/21/13 2054 06/22/13 0806  GLUCAP 202* 97 176* 90 130*       Signed:  Nashira Mcglynn, JAI-GURMUKH  [facility] 06/22/2013, 10:42 AM

## 2013-06-24 ENCOUNTER — Emergency Department (HOSPITAL_COMMUNITY)
Admission: EM | Admit: 2013-06-24 | Discharge: 2013-06-24 | Disposition: A | Discharge status: Home / Self Care | Payer: Medicare Other | Attending: Emergency Medicine | Admitting: Emergency Medicine

## 2013-06-24 ENCOUNTER — Emergency Department (HOSPITAL_COMMUNITY): Payer: Medicare Other

## 2013-06-24 ENCOUNTER — Encounter (HOSPITAL_COMMUNITY): Payer: Self-pay | Admitting: Emergency Medicine

## 2013-06-24 DIAGNOSIS — L97509 Non-pressure chronic ulcer of other part of unspecified foot with unspecified severity: Secondary | ICD-10-CM | POA: Insufficient documentation

## 2013-06-24 DIAGNOSIS — K219 Gastro-esophageal reflux disease without esophagitis: Secondary | ICD-10-CM | POA: Insufficient documentation

## 2013-06-24 DIAGNOSIS — F329 Major depressive disorder, single episode, unspecified: Secondary | ICD-10-CM | POA: Insufficient documentation

## 2013-06-24 DIAGNOSIS — L97529 Non-pressure chronic ulcer of other part of left foot with unspecified severity: Secondary | ICD-10-CM

## 2013-06-24 DIAGNOSIS — Z87448 Personal history of other diseases of urinary system: Secondary | ICD-10-CM | POA: Insufficient documentation

## 2013-06-24 DIAGNOSIS — Z9089 Acquired absence of other organs: Secondary | ICD-10-CM | POA: Insufficient documentation

## 2013-06-24 DIAGNOSIS — E1169 Type 2 diabetes mellitus with other specified complication: Principal | ICD-10-CM

## 2013-06-24 DIAGNOSIS — IMO0002 Reserved for concepts with insufficient information to code with codable children: Secondary | ICD-10-CM

## 2013-06-24 DIAGNOSIS — F411 Generalized anxiety disorder: Secondary | ICD-10-CM | POA: Insufficient documentation

## 2013-06-24 DIAGNOSIS — Z792 Long term (current) use of antibiotics: Secondary | ICD-10-CM | POA: Insufficient documentation

## 2013-06-24 DIAGNOSIS — Z79899 Other long term (current) drug therapy: Secondary | ICD-10-CM | POA: Insufficient documentation

## 2013-06-24 DIAGNOSIS — G8929 Other chronic pain: Secondary | ICD-10-CM | POA: Insufficient documentation

## 2013-06-24 DIAGNOSIS — I10 Essential (primary) hypertension: Secondary | ICD-10-CM | POA: Insufficient documentation

## 2013-06-24 DIAGNOSIS — F3289 Other specified depressive episodes: Secondary | ICD-10-CM | POA: Insufficient documentation

## 2013-06-24 DIAGNOSIS — Z794 Long term (current) use of insulin: Secondary | ICD-10-CM | POA: Insufficient documentation

## 2013-06-24 DIAGNOSIS — E1165 Type 2 diabetes mellitus with hyperglycemia: Secondary | ICD-10-CM | POA: Insufficient documentation

## 2013-06-24 DIAGNOSIS — R739 Hyperglycemia, unspecified: Secondary | ICD-10-CM

## 2013-06-24 DIAGNOSIS — E11621 Type 2 diabetes mellitus with foot ulcer: Secondary | ICD-10-CM

## 2013-06-24 LAB — BASIC METABOLIC PANEL
BUN: 13 mg/dL (ref 6–23)
CO2: 27 meq/L (ref 19–32)
CREATININE: 0.82 mg/dL (ref 0.50–1.35)
Calcium: 9.8 mg/dL (ref 8.4–10.5)
Chloride: 96 mEq/L (ref 96–112)
GFR calc Af Amer: >90 mL/min (ref >90)
GFR calc non Af Amer: >90 mL/min (ref >90)
GLUCOSE: 332 mg/dL — AB (ref 70–99)
Potassium: 4.6 mEq/L (ref 3.7–5.3)
SODIUM: 138 meq/L (ref 137–147)

## 2013-06-24 LAB — CBC WITH DIFFERENTIAL/PLATELET
Basophils Absolute: 0 10*3/uL (ref 0.0–0.1)
Basophils Relative: 0 % (ref 0–1)
EOS PCT: 3 % (ref 0–5)
Eosinophils Absolute: 0.1 10*3/uL (ref 0.0–0.7)
HEMATOCRIT: 41.3 % (ref 39.0–52.0)
Hemoglobin: 13.8 g/dL (ref 13.0–17.0)
LYMPHS ABS: 1.7 10*3/uL (ref 0.7–4.0)
LYMPHS PCT: 36 % (ref 12–46)
MCH: 28.1 pg (ref 26.0–34.0)
MCHC: 33.4 g/dL (ref 30.0–36.0)
MCV: 84.1 fL (ref 78.0–100.0)
MONO ABS: 0.3 10*3/uL (ref 0.1–1.0)
Monocytes Relative: 6 % (ref 3–12)
Neutro Abs: 2.6 10*3/uL (ref 1.7–7.7)
Neutrophils Relative %: 55 % (ref 43–77)
Platelets: 274 10*3/uL (ref 150–400)
RBC: 4.91 MIL/uL (ref 4.22–5.81)
RDW: 13.4 % (ref 11.5–15.5)
WBC: 4.7 10*3/uL (ref 4.0–10.5)

## 2013-06-24 MED ORDER — HYDROCODONE-ACETAMINOPHEN 5-325 MG PO TABS
ORAL_TABLET | ORAL | Status: AC
  Filled 2013-06-24: qty 2

## 2013-06-24 MED ORDER — HYDROCODONE-ACETAMINOPHEN 5-325 MG PO TABS
2.0000 | ORAL_TABLET | Freq: Once | ORAL | Status: AC
  Administered 2013-06-24: 2 via ORAL

## 2013-06-24 MED ORDER — HYDROCODONE-ACETAMINOPHEN 5-325 MG PO TABS
2.0000 | ORAL_TABLET | Freq: Once | ORAL | Status: AC
  Administered 2013-06-24: 2 via ORAL
  Filled 2013-06-24: qty 2

## 2013-06-24 NOTE — ED Provider Notes (Signed)
CSN: [id number]     Arrival date & time 06/24/13  1638 History   First MD Initiated Contact with Patient 06/24/13 Ali Chukson     Chief Complaint  Patient presents with  . Foot Ulcer   (Consider location/radiation/quality/duration/timing/severity/associated sxs/prior Treatment) HPI Comments: 38 yo male with DM, left superficial foot ulcer, recent cellulitis/ admission for abx to hospital and is on clindamycin outpatient presents with drainage from left foot ulcer and is coming in to be checked. No fevers or vomiting.  No recurrent rash.  Mild redness similar to when he left hospital.  Pt feels okay other than  Being anxious about having worsening infection.  Pain to palpation.   The history is provided by the patient.    Past Medical History  Diagnosis Date  . GERD (gastroesophageal reflux disease)   . Diabetes mellitus   . HTN (hypertension)   . Depression   . Anxiety   . Chronic pain   . ED (erectile dysfunction)   . Hyperlipidemia    Past Surgical History  Procedure Laterality Date  . Knee surgery    . Back surgery    . Cholecystectomy  2009  . Tonsillectomy    . Cholecystectomy open     Family History  Problem Relation Age of Onset  . Coronary artery disease Father   . Heart attack Father    History  Substance Use Topics  . Smoking status: Never Smoker   . Smokeless tobacco: Current User    Types: Snuff     Comment: dip x 20  . Alcohol Use: Yes     Comment: very little 4-5 beers/yr    Review of Systems  Constitutional: Negative for fever and chills.  HENT: Negative for congestion.   Eyes: Negative for visual disturbance.  Respiratory: Negative for shortness of breath.   Cardiovascular: Negative for chest pain.  Gastrointestinal: Negative for vomiting and abdominal pain.  Genitourinary: Negative for dysuria and flank pain.  Musculoskeletal: Negative for back pain, neck pain and neck stiffness.  Skin: Positive for wound. Negative for rash.  Neurological: Negative  for light-headedness and headaches.  Psychiatric/Behavioral: The patient is nervous/anxious.     Allergies  Levaquin  Home Medications   Current Outpatient Rx  Name  Route  Sig  Dispense  Refill  . clindamycin (CLEOCIN) 300 MG capsule   Oral   Take 1 capsule (300 mg total) by mouth every 8 (eight) hours.   30 capsule   0   . escitalopram (LEXAPRO) 10 MG tablet   Oral   Take 1 tablet (10 mg total) by mouth daily.   30 tablet   5   . esomeprazole (NEXIUM) 40 MG capsule   Oral   Take 1 capsule (40 mg total) by mouth daily before breakfast.   30 capsule   0   . insulin NPH-insulin regular (NOVOLIN 70/30) (70-30) 100 UNIT/ML injection   Subcutaneous   Inject 50-84 Units into the skin 2 (two) times daily with a meal. 84 units in the am and then 50 units in the evening   10 mL   5   . lisinopril (PRINIVIL,ZESTRIL) 20 MG tablet   Oral   Take 1 tablet (20 mg total) by mouth daily.   30 tablet   5   . pioglitazone-metformin (ACTOPLUS MET) 15-850 MG per tablet   Oral   Take 1 tablet by mouth 2 (two) times daily with a meal.          BP 126/83  Pulse 137  Temp(Src) 98.7 F (37.1 C) (Oral)  Resp 18  Ht _0  (1.854 m)  Wt 270 lb (122.471 kg)  BMI 35.63 kg/m2  SpO2 96% Physical Exam  Nursing note and vitals reviewed. Constitutional: He is oriented to person, place, and time. He appears well-developed and well-nourished.  HENT:  Head: Normocephalic and atraumatic.  Eyes: Conjunctivae are normal. Right eye exhibits no discharge. Left eye exhibits no discharge.  Neck: Normal range of motion. Neck supple. No tracheal deviation present.  Cardiovascular: Normal rate and regular rhythm.   Pulmonary/Chest: Effort normal and breath sounds normal.  Abdominal: Soft. He exhibits no distension. There is no tenderness. There is no guarding.  Musculoskeletal: He exhibits tenderness. He exhibits no edema.  Neurological: He is alert and oriented to person, place, and time.   Skin: Skin is warm. There is erythema.  Plantar aspect of lateral distal left foot 1 cm superficial ulcer with mild crusting, no bone visualized, mild erythema however pt feels improved/ same since left hospital, no warmth or swelling, no crepitus, nv intact  Psychiatric: He has a normal mood and affect.    ED Course  Procedures (including critical care time) Labs Review Labs Reviewed  BASIC METABOLIC PANEL - Abnormal; Notable for the following:    Glucose, Bld 332 (*)    All other components within normal limits  CBC WITH DIFFERENTIAL   Imaging Review Dg Foot Complete Left  06/24/2013   CLINICAL DATA:  Left foot pain.  Diabetic ulcer.  EXAM: LEFT FOOT - COMPLETE 3+ VIEW  COMPARISON:  None.  FINDINGS: There is no evidence of fracture or dislocation. There is no evidence of arthropathy or other focal bone abnormality. Soft tissues are unremarkable.  IMPRESSION: Negative.   Electronically Signed   By: Earle Gell M.D.   On: 06/24/2013 19:54    EKG Interpretation   None       MDM   1. Hyperglycemia   2. Diabetes type 2, uncontrolled   3. Type 2 diabetes mellitus with left diabetic foot ulcer    Superficial ulcer. No signs of significant infection, pt on abx po.  Pt tachycardic however anxious bc he does not want a severe infection with his dm.  Discussed close fup/ stressed recheck in 3-4 days.  Pain meds and fluids given, well appearing in ED. Discussed improved DM glu control. Filed Vitals:   06/24/13 1647 06/24/13 2029  BP: 126/83 135/78  Pulse: 137 102  Temp: 98.7 F (37.1 C) 98.1 F (36.7 C)  TempSrc: Oral Oral  Resp: 18 20  Height: _1  (1.854 m)   Weight: 270 lb (122.471 kg)   SpO2: 96% 98%    Results and differential diagnosis were discussed with the patient. Close follow up outpatient was discussed, patient comfortable with the plan.   Diagnosis: Superficial ulcer, DM, Hyperglycemia      Mariea Clonts, MD 06/24/13 2038

## 2013-06-24 NOTE — ED Notes (Signed)
Patient states he was discharged from hospital 2 days ago being treated for ulcer on left foot. States "the hospitalist told me if the ulcer broke open and started draining again to come back to the hospital." States ulcer is draining "considerably" today.

## 2013-06-24 NOTE — Discharge Instructions (Signed)
If you were given medicines take as directed.  If you are on coumadin or contraceptives realize their levels and effectiveness is altered by many different medicines.  If you have any reaction (rash, tongues swelling, other) to the medicines stop taking and see a physician.   °Please follow up as directed and return to the ER or see a physician for new or worsening symptoms.  Thank you. ° ° °

## 2013-06-25 LAB — CULTURE, BLOOD (ROUTINE X 2)
Culture: NO GROWTH
Culture: NO GROWTH

## 2013-06-26 ENCOUNTER — Inpatient Hospital Stay (HOSPITAL_COMMUNITY)
Admission: EM | Admit: 2013-06-26 | Discharge: 2013-06-29 | DRG: 638 | Disposition: A | Discharge status: Home / Self Care | Payer: Medicare Other | Attending: Internal Medicine | Admitting: Internal Medicine

## 2013-06-26 ENCOUNTER — Encounter (HOSPITAL_COMMUNITY): Payer: Self-pay | Admitting: Emergency Medicine

## 2013-06-26 DIAGNOSIS — L0291 Cutaneous abscess, unspecified: Secondary | ICD-10-CM

## 2013-06-26 DIAGNOSIS — Z794 Long term (current) use of insulin: Secondary | ICD-10-CM

## 2013-06-26 DIAGNOSIS — K219 Gastro-esophageal reflux disease without esophagitis: Secondary | ICD-10-CM | POA: Diagnosis present

## 2013-06-26 DIAGNOSIS — E11628 Type 2 diabetes mellitus with other skin complications: Secondary | ICD-10-CM | POA: Diagnosis present

## 2013-06-26 DIAGNOSIS — E118 Type 2 diabetes mellitus with unspecified complications: Secondary | ICD-10-CM

## 2013-06-26 DIAGNOSIS — F3289 Other specified depressive episodes: Secondary | ICD-10-CM | POA: Diagnosis present

## 2013-06-26 DIAGNOSIS — F411 Generalized anxiety disorder: Secondary | ICD-10-CM | POA: Diagnosis present

## 2013-06-26 DIAGNOSIS — Z79899 Other long term (current) drug therapy: Secondary | ICD-10-CM

## 2013-06-26 DIAGNOSIS — L02619 Cutaneous abscess of unspecified foot: Secondary | ICD-10-CM | POA: Diagnosis present

## 2013-06-26 DIAGNOSIS — Z8249 Family history of ischemic heart disease and other diseases of the circulatory system: Secondary | ICD-10-CM

## 2013-06-26 DIAGNOSIS — E119 Type 2 diabetes mellitus without complications: Secondary | ICD-10-CM | POA: Diagnosis present

## 2013-06-26 DIAGNOSIS — M545 Low back pain, unspecified: Secondary | ICD-10-CM | POA: Diagnosis present

## 2013-06-26 DIAGNOSIS — E1169 Type 2 diabetes mellitus with other specified complication: Principal | ICD-10-CM | POA: Diagnosis present

## 2013-06-26 DIAGNOSIS — L03119 Cellulitis of unspecified part of limb: Secondary | ICD-10-CM

## 2013-06-26 DIAGNOSIS — F329 Major depressive disorder, single episode, unspecified: Secondary | ICD-10-CM | POA: Diagnosis present

## 2013-06-26 DIAGNOSIS — L089 Local infection of the skin and subcutaneous tissue, unspecified: Secondary | ICD-10-CM

## 2013-06-26 DIAGNOSIS — L98499 Non-pressure chronic ulcer of skin of other sites with unspecified severity: Secondary | ICD-10-CM | POA: Diagnosis present

## 2013-06-26 DIAGNOSIS — R739 Hyperglycemia, unspecified: Secondary | ICD-10-CM

## 2013-06-26 DIAGNOSIS — E785 Hyperlipidemia, unspecified: Secondary | ICD-10-CM | POA: Diagnosis present

## 2013-06-26 DIAGNOSIS — L0390 Cellulitis, unspecified: Secondary | ICD-10-CM

## 2013-06-26 DIAGNOSIS — G8929 Other chronic pain: Secondary | ICD-10-CM | POA: Diagnosis present

## 2013-06-26 DIAGNOSIS — E1165 Type 2 diabetes mellitus with hyperglycemia: Secondary | ICD-10-CM

## 2013-06-26 DIAGNOSIS — I10 Essential (primary) hypertension: Secondary | ICD-10-CM | POA: Diagnosis present

## 2013-06-26 DIAGNOSIS — Z888 Allergy status to other drugs, medicaments and biological substances status: Secondary | ICD-10-CM

## 2013-06-26 DIAGNOSIS — L97409 Non-pressure chronic ulcer of unspecified heel and midfoot with unspecified severity: Secondary | ICD-10-CM | POA: Diagnosis present

## 2013-06-26 DIAGNOSIS — IMO0002 Reserved for concepts with insufficient information to code with codable children: Secondary | ICD-10-CM

## 2013-06-26 LAB — GLUCOSE, CAPILLARY
Glucose-Capillary: 161 mg/dL — ABNORMAL HIGH (ref 70–99)
Glucose-Capillary: 161 mg/dL — ABNORMAL HIGH (ref 70–99)
Glucose-Capillary: 178 mg/dL — ABNORMAL HIGH (ref 70–99)
Glucose-Capillary: 205 mg/dL — ABNORMAL HIGH (ref 70–99)
Glucose-Capillary: 419 mg/dL — ABNORMAL HIGH (ref 70–99)

## 2013-06-26 LAB — BASIC METABOLIC PANEL
BUN: 9 mg/dL (ref 6–23)
CO2: 26 mEq/L (ref 19–32)
CREATININE: 0.72 mg/dL (ref 0.50–1.35)
Calcium: 9.2 mg/dL (ref 8.4–10.5)
Chloride: 97 mEq/L (ref 96–112)
Glucose, Bld: 422 mg/dL — ABNORMAL HIGH (ref 70–99)
Potassium: 4.5 mEq/L (ref 3.7–5.3)
Sodium: 137 mEq/L (ref 137–147)

## 2013-06-26 LAB — CBC WITH DIFFERENTIAL/PLATELET
BASOS PCT: 1 % (ref 0–1)
Basophils Absolute: 0 10*3/uL (ref 0.0–0.1)
EOS ABS: 0.2 10*3/uL (ref 0.0–0.7)
Eosinophils Relative: 4 % (ref 0–5)
HEMATOCRIT: 37.9 % — AB (ref 39.0–52.0)
HEMOGLOBIN: 12.4 g/dL — AB (ref 13.0–17.0)
Lymphocytes Relative: 31 % (ref 12–46)
Lymphs Abs: 1.3 10*3/uL (ref 0.7–4.0)
MCH: 27.6 pg (ref 26.0–34.0)
MCHC: 32.7 g/dL (ref 30.0–36.0)
MCV: 84.4 fL (ref 78.0–100.0)
MONO ABS: 0.2 10*3/uL (ref 0.1–1.0)
MONOS PCT: 6 % (ref 3–12)
Neutro Abs: 2.4 10*3/uL (ref 1.7–7.7)
Neutrophils Relative %: 58 % (ref 43–77)
Platelets: 239 10*3/uL (ref 150–400)
RBC: 4.49 MIL/uL (ref 4.22–5.81)
RDW: 13.3 % (ref 11.5–15.5)
WBC: 4 10*3/uL (ref 4.0–10.5)

## 2013-06-26 LAB — SEDIMENTATION RATE: Sed Rate: 4 mm/hr (ref 0–16)

## 2013-06-26 MED ORDER — PIPERACILLIN-TAZOBACTAM 3.375 G IVPB
3.3750 g | Freq: Three times a day (TID) | INTRAVENOUS | Status: DC
  Administered 2013-06-26 – 2013-06-29 (×9): 3.375 g via INTRAVENOUS
  Filled 2013-06-26 (×11): qty 50

## 2013-06-26 MED ORDER — ENOXAPARIN SODIUM 40 MG/0.4ML ~~LOC~~ SOLN
40.0000 mg | SUBCUTANEOUS | Status: DC
  Administered 2013-06-27 – 2013-06-28 (×2): 40 mg via SUBCUTANEOUS
  Filled 2013-06-26 (×2): qty 0.4

## 2013-06-26 MED ORDER — OXYCODONE-ACETAMINOPHEN 5-325 MG PO TABS
1.0000 | ORAL_TABLET | ORAL | Status: DC | PRN
  Administered 2013-06-26 – 2013-06-29 (×16): 2 via ORAL
  Filled 2013-06-26 (×16): qty 2

## 2013-06-26 MED ORDER — VANCOMYCIN HCL 10 G IV SOLR
2000.0000 mg | Freq: Once | INTRAVENOUS | Status: AC
  Administered 2013-06-26: 2000 mg via INTRAVENOUS
  Filled 2013-06-26: qty 2000

## 2013-06-26 MED ORDER — SODIUM CHLORIDE 0.9 % IV SOLN
INTRAVENOUS | Status: DC
  Administered 2013-06-27 – 2013-06-28 (×3): via INTRAVENOUS

## 2013-06-26 MED ORDER — SODIUM CHLORIDE 0.9 % IV SOLN: INTRAVENOUS | Status: DC

## 2013-06-26 MED ORDER — SODIUM CHLORIDE 0.9 % IV SOLN: 1000.0000 mL | INTRAVENOUS | Status: DC

## 2013-06-26 MED ORDER — LISINOPRIL 20 MG PO TABS
20.0000 mg | ORAL_TABLET | Freq: Every day | ORAL | Status: DC
  Administered 2013-06-27 – 2013-06-29 (×3): 20 mg via ORAL
  Filled 2013-06-26 (×3): qty 1

## 2013-06-26 MED ORDER — INSULIN ASPART 100 UNIT/ML ~~LOC~~ SOLN: 0.0000 [IU] | Freq: Every day | SUBCUTANEOUS | Status: DC

## 2013-06-26 MED ORDER — PIPERACILLIN-TAZOBACTAM 3.375 G IVPB 30 MIN
3.3750 g | Freq: Once | INTRAVENOUS | Status: AC
  Administered 2013-06-26: 3.375 g via INTRAVENOUS
  Filled 2013-06-26 (×2): qty 50

## 2013-06-26 MED ORDER — OXYCODONE-ACETAMINOPHEN 5-325 MG PO TABS: 1.0000 | ORAL_TABLET | ORAL | Status: DC | PRN

## 2013-06-26 MED ORDER — INSULIN ASPART 100 UNIT/ML ~~LOC~~ SOLN
0.0000 [IU] | Freq: Three times a day (TID) | SUBCUTANEOUS | Status: DC
  Administered 2013-06-27: 3 [IU] via SUBCUTANEOUS
  Administered 2013-06-28: 4 [IU] via SUBCUTANEOUS
  Administered 2013-06-29: 3 [IU] via SUBCUTANEOUS

## 2013-06-26 MED ORDER — PANTOPRAZOLE SODIUM 40 MG PO TBEC
40.0000 mg | DELAYED_RELEASE_TABLET | Freq: Every day | ORAL | Status: DC
  Administered 2013-06-27 – 2013-06-29 (×3): 40 mg via ORAL
  Filled 2013-06-26 (×3): qty 1

## 2013-06-26 MED ORDER — VANCOMYCIN HCL IN DEXTROSE 1-5 GM/200ML-% IV SOLN
1000.0000 mg | Freq: Once | INTRAVENOUS | Status: AC
  Administered 2013-06-26: 1000 mg via INTRAVENOUS
  Filled 2013-06-26: qty 200

## 2013-06-26 MED ORDER — SODIUM CHLORIDE 0.9 % IV SOLN
INTRAVENOUS | Status: DC
  Administered 2013-06-26: 1.5 [IU]/h via INTRAVENOUS
  Filled 2013-06-26: qty 1

## 2013-06-26 MED ORDER — ONDANSETRON HCL 4 MG/2ML IJ SOLN: 4.0000 mg | Freq: Four times a day (QID) | INTRAMUSCULAR | Status: DC | PRN

## 2013-06-26 MED ORDER — OXYCODONE-ACETAMINOPHEN 5-325 MG PO TABS
1.0000 | ORAL_TABLET | ORAL | Status: DC | PRN
  Administered 2013-06-26: 1 via ORAL
  Filled 2013-06-26: qty 1

## 2013-06-26 MED ORDER — ESCITALOPRAM OXALATE 10 MG PO TABS
10.0000 mg | ORAL_TABLET | Freq: Every day | ORAL | Status: DC
  Administered 2013-06-27 – 2013-06-29 (×3): 10 mg via ORAL
  Filled 2013-06-26 (×3): qty 1

## 2013-06-26 MED ORDER — INSULIN ASPART PROT & ASPART (70-30 MIX) 100 UNIT/ML ~~LOC~~ SUSP: 84.0000 [IU] | Freq: Every day | SUBCUTANEOUS | Status: DC

## 2013-06-26 MED ORDER — INSULIN ASPART PROT & ASPART (70-30 MIX) 100 UNIT/ML ~~LOC~~ SUSP
60.0000 [IU] | Freq: Every day | SUBCUTANEOUS | Status: DC
  Administered 2013-06-27: 60 [IU] via SUBCUTANEOUS
  Filled 2013-06-26: qty 10

## 2013-06-26 MED ORDER — VANCOMYCIN HCL 10 G IV SOLR
1500.0000 mg | Freq: Two times a day (BID) | INTRAVENOUS | Status: DC
  Administered 2013-06-27 – 2013-06-29 (×5): 1500 mg via INTRAVENOUS
  Filled 2013-06-26 (×6): qty 1500

## 2013-06-26 MED ORDER — SODIUM CHLORIDE 0.9 % IV SOLN
1000.0000 mL | Freq: Once | INTRAVENOUS | Status: AC
  Administered 2013-06-26: 1000 mL via INTRAVENOUS

## 2013-06-26 MED ORDER — ONDANSETRON HCL 4 MG PO TABS
4.0000 mg | ORAL_TABLET | Freq: Four times a day (QID) | ORAL | Status: DC | PRN
Start: 2013-06-26 — End: 2013-06-29

## 2013-06-26 NOTE — H&P (Signed)
[facility] History and Physical  Patient: Christopher Raymond  NLZ:[id number]  DOB: 12/07/1975  DOS: the patient was seen and examined on 06/26/2013 PCP: Pcp Not In System  Chief Complaint: Discharge from left leg ulcer  HPI: Christopher Raymond is a 38 y.o. male with Past medical history of Diabetes mellitus, hypertension, chronic back pain, dyslipidemia, GERD. The patient is coming from home. The patient presents with complaints of discharge of pus Like material from a nonhealing ulcer On his left foot.He mentions that one month ago he Noticed an open area on his left foot which progressively grew in an ulcer.He was admitted to any pain hospital and was treated with vancomycin and Zosyn and since there was improvement in his ulcer he was discharged on oral clindamycin. After the discharge initially the ulcer was getting better but since last 2 days  He started having increased pain on the left leg associated with the redness involving his daughter some of the food and warmth as well as discharge that was having puslike material since today. Pt denies any fever, chills, headache, cough, chest pain, palpitation, shortness of breath, orthopnea, PND, nausea, vomiting, abdominal pain, diarrhea, constipation, active bleeding, burning urination, dizziness, pedal edema,  focal neurological deficit.   Review of Systems: as mentioned in the history of present illness.  A Comprehensive review of the other systems is negative.  Past Medical History  Diagnosis Date  . GERD (gastroesophageal reflux disease)   . Diabetes mellitus   . HTN (hypertension)   . Depression   . Anxiety   . Chronic pain   . ED (erectile dysfunction)   . Hyperlipidemia    Past Surgical History  Procedure Laterality Date  . Knee surgery    . Back surgery    . Cholecystectomy  2009  . Tonsillectomy    . Cholecystectomy open     Social History:  reports that he has never smoked. His smokeless tobacco use includes Snuff. He  reports that he drinks alcohol. He reports that he does not use illicit drugs. Independent for most of his  ADL.  Allergies  Allergen Reactions  . Levaquin [Levofloxacin]     Rash on skin, not anaphylaxis    Family History  Problem Relation Age of Onset  . Coronary artery disease Father   . Heart attack Father     Prior to Admission medications   Medication Sig Start Date End Date Taking? Authorizing Provider  clindamycin (CLEOCIN) 300 MG capsule Take 1 capsule (300 mg total) by mouth every 8 (eight) hours. 06/22/13  Yes Nita Sells, MD  escitalopram (LEXAPRO) 10 MG tablet Take 1 tablet (10 mg total) by mouth daily. 10/23/12  Yes Mikey Kirschner, MD  esomeprazole (NEXIUM) 40 MG capsule Take 1 capsule (40 mg total) by mouth daily before breakfast. 03/30/13  Yes Julianne Rice, MD  insulin NPH-insulin regular (NOVOLIN 70/30) (70-30) 100 UNIT/ML injection Inject 50-84 Units into the skin 2 (two) times daily with a meal. 84 units in the am and then 50 units in the evening 09/18/12  Yes Mikey Kirschner, MD  lisinopril (PRINIVIL,ZESTRIL) 20 MG tablet Take 1 tablet (20 mg total) by mouth daily. 10/23/12  Yes Mikey Kirschner, MD  pioglitazone-metformin (ACTOPLUS MET) [phone] MG per tablet Take 1 tablet by mouth 2 (two) times daily with a meal.   Yes Historical Provider, MD    Physical Exam: Filed Vitals:   06/26/13 1605 06/26/13 2104 06/26/13 2219  BP: 149/85 155/76 168/109  Pulse: 108 85 96  Temp: 98.2 F (36.8 C)  98.3 F (36.8 C)  TempSrc: Oral  Oral  Resp: 20  21  Height: 6' 1"  (1.854 m)    Weight: 122.471 kg (270 lb)  136.8 kg (301 lb 9.4 oz)  SpO2: 98% 99% 99%    General: Alert, Awake and Oriented to Time, Place and Person. Appear in moderate distress Eyes: PERRL ENT: Oral Mucosa clear moist. Neck: No JVD Cardiovascular: S1 and S2 Present, No Murmur, Peripheral Pulses Present Respiratory: Bilateral Air entry equal and Decreased, Clear to Auscultation,  No Crackles,No  wheezes Abdomen: Bowel Sound Present, Soft and Non tender Skin: No Rash Extremities: Left leg plantar surface has 1 cm size ulcer without any active discharge, Pedal edema, No calf tenderness Neurologic: Grossly Unremarkable.  Labs on Admission:  CBC:  Recent Labs Lab 06/19/13 2322 06/20/13 1009 06/21/13 0856 06/22/13 0539 06/24/13 1921 06/26/13 1652  WBC 4.4 3.8* 4.1 4.4 4.7 4.0  NEUTROABS 2.5  --  1.9  --  2.6 2.4  HGB 13.2 12.7* 11.9* 11.4* 13.8 12.4*  HCT 39.4 38.1* 35.2* 34.2* 41.3 37.9*  MCV 84.0 83.6 83.4 83.6 84.1 84.4  PLT 242 225 204 216 274 239    CMP     Component Value Date/Time   NA 137 06/26/2013 1652   K 4.5 06/26/2013 1652   CL 97 06/26/2013 1652   CO2 26 06/26/2013 1652   GLUCOSE 422* 06/26/2013 1652   BUN 9 06/26/2013 1652   CREATININE 0.72 06/26/2013 1652   CALCIUM 9.2 06/26/2013 1652   PROT 7.0 06/20/2013 1009   ALBUMIN 3.7 06/20/2013 1009   AST 16 06/20/2013 1009   ALT 16 06/20/2013 1009   ALKPHOS 62 06/20/2013 1009   BILITOT 0.4 06/20/2013 1009   GFRNONAA >90 06/26/2013 1652   GFRAA >90 06/26/2013 1652    No results found for this basename: LIPASE, AMYLASE,  in the last 168 hours No results found for this basename: AMMONIA,  in the last 168 hours  No results found for this basename: CKTOTAL, CKMB, CKMBINDEX, TROPONINI,  in the last 168 hours BNP (last 3 results) No results found for this basename: PROBNP,  in the last 8760 hours  Radiological Exams on Admission: No results found.   Assessment/Plan Principal Problem:   Non-healing ulcer Active Problems:   GERD (gastroesophageal reflux disease)   Essential hypertension, benign   Diabetes   Chronic low back pain   Diabetic foot infection   1. Non-healing ulcer The patient is presenting with complaints of nonhealing ulcer that he is noticing since last one month was initially improving on broad-spectrum antibiotic and then after being on clindamycin is gradually worsening. This is now involving pain  and redness of the dorsum of the foot. Patient is admitted for further workup including an MRI to rule out osteomyelitis. ESR and CRP is pending. He'll be given IV vancomycin and IV Zosyn. Will follow blood culture.  2.Hyperglycemia Patient's blood sugar were more than 400 in the other hospital at present his blood sugar has improved significantly due to IV insulin drip.  He does not appear in DKA. I will stop the drip and put him on his home regimen of insulin with a slight increase in the dose and I would also continue him on resistant sliding scale. I would hold his Actos and metformin  3.Hypertension Continue lisinopril  4.Chronic back pain Continue Percocet at home when necessary  DVT Prophylaxis: subcutaneous Heparin Nutrition: Diabetic and cardiac  diet  Code Status: Full  Family Communication: Family was present at bedside, opportunity was given to ask question and all questions were answered satisfactorily at the time of interview. Disposition: Admitted to npatient in med-surge unit.  Author: Berle Mull, MD [facility] Pager: [phone] 06/26/2013, 10:53 PM    If 7PM-7AM, please contact night-coverage [email or web address] Password [email or web address]

## 2013-06-26 NOTE — Significant Event (Signed)
I received a call from Christopher Raymond this afternoon He was concerned that his foot has had a small amount of blood has a red streak and had some pus We discussed that he just been to the emergency room 06/24/13 after day of discharge and it was noted that he had no abnormalities on his labs and that the foot x-ray showed no fracture dislocation and no evidences of cellulitis/osteo I advised the patient that he may need to just take his antibiotics for a couple more days and to take weight off of the leg while ambulating. If he has a high fever, has chills, has a lot more pus coming out of the wound, he is to seek emergency room care or followup with his primary care physician. Patient clearly understood  Verneita Griffes, MD [facility] [phone]

## 2013-06-26 NOTE — Progress Notes (Signed)
NURSING PROGRESS NOTE  Christopher Raymond [id number] Admission Data: 06/26/2013 2219 Attending Provider: Berle Mull, MD PCP:Pcp Not In System Code Status: Full  Christopher Raymond is a 38 y.o. male patient admitted from ED:  -No acute distress noted.  -No complaints of shortness of breath.  -No complaints of chest pain.   Blood pressure 168/109, pulse 96, temperature 98.3 F (36.8 C), temperature source Oral, resp. rate 21, height 6\' 1"  (1.854 m), weight 136.8 kg (301 lb 9.4 oz), SpO2 99.00%.   IV Fluids:  IV in place, occlusive dsg intact without redness, IV cath hand left, condition patent and no redness Insulin drip at 1.2 mL/h.   Allergies:  Levaquin  Past Medical History:   has a past medical history of GERD (gastroesophageal reflux disease); Diabetes mellitus; HTN (hypertension); Depression; Anxiety; Chronic pain; ED (erectile dysfunction); and Hyperlipidemia.  Past Surgical History:   has past surgical history that includes Knee surgery; Back surgery; Cholecystectomy (2009); Tonsillectomy; and Cholecystectomy open.  Social History:   reports that he has never smoked. His smokeless tobacco use includes Snuff. He reports that he drinks alcohol. He reports that he does not use illicit drugs.  Skin: WNL except for diabetic ulcer on bottom lateral side of left foot with red streak to top of left foot.  Patient/Family orientated to room. Information packet given to patient/family. Admission inpatient armband information verified with patient/family to include name and date of birth and placed on patient arm. Side rails up x 2, fall assessment and education completed with patient/family. Patient/family able to verbalize understanding of risk associated with falls and verbalized understanding to call for assistance before getting out of bed. Call light within reach. Patient/family able to voice and demonstrate understanding of unit orientation instructions.

## 2013-06-26 NOTE — ED Notes (Signed)
Pt with wound to bottom of left foot x 72month, per pt just released from admission here for same, cbg 419 and pt states that he just ate PTA

## 2013-06-26 NOTE — Progress Notes (Signed)
Received report from RN at [facility]. Patient en route.

## 2013-06-26 NOTE — ED Provider Notes (Addendum)
CSN: [id number]     Arrival date & time 06/26/13  1548 History   First MD Initiated Contact with Patient 06/26/13 1716 This chart was scribed for Christopher Norrie, MD by Anastasia Pall, ED Scribe. This patient was seen in room APA12/APA12 and the patient's care was started at [facility] PM.      Chief Complaint  Patient presents with  . Foot Pain    The history is provided by the patient. No language interpreter was used.   HPI Comments: Christopher Raymond is a 38 y.o. male who presents to the Emergency Department complaining of left sided, left foot ulcer, onset 1 month ago. He reports the ulcer was probably due to dryness and cracking of his skin. He reports he was hospitalized here, for 4 days last week. He states the ulcer improved with IV antibiotics and he was discharged on oral antibiotics, Clindamycin. He states he was discharged 4 days ago. He reports coming back 2 days ago due to increased pain. He had an x-ray done at that time. He denies fever 3 days ago. He states they did not change his medication.    He has noticed red streaking that started today. He reports drainage this morning and yesterday. He reports he is able to ambulate, but states when he does he experiences shooting pain up his left leg. He reports taking his antibiotic 3 times a day. He states he has about 4 days left of antibiotic. He denies h/o similar ulcer/infection in the past. He denies current fever, chills, and any other associated symptoms.  Patient does not have a local doctor. He states however he was given a followup appointment at the wound Center however it is not until January 14.  Pt reports h/o DM since 2005. He reports being on insulin and pills for DM. He reports blood sugar levels 140-200 in the morning depending on what he eats. He reports levels of 80-120 while at hospital several days ago. He reports eating green beans and mash potatoes last night. He reports having burger today. He denies h/o smoking. He reports  occasional EtOH use. He reports being on disability for DM and back pain.   Dr. Derrek Monaco in [place]. Pt denies having local doctor. PCP - none  Past Medical History  Diagnosis Date  . GERD (gastroesophageal reflux disease)   . Diabetes mellitus   . HTN (hypertension)   . Depression   . Anxiety   . Chronic pain   . ED (erectile dysfunction)   . Hyperlipidemia    Past Surgical History  Procedure Laterality Date  . Knee surgery    . Back surgery    . Cholecystectomy  2009  . Tonsillectomy    . Cholecystectomy open     Family History  Problem Relation Age of Onset  . Coronary artery disease Father   . Heart attack Father    History  Substance Use Topics  . Smoking status: Never Smoker   . Smokeless tobacco: Current User    Types: Snuff     Comment: dip x 20  . Alcohol Use: Yes     Comment: very little 4-5 beers/yr  on disability for his back and diabetes Lives at home Lives with spouse  Review of Systems  Constitutional: Negative for fever and chills.  Skin: Positive for wound (left foot ulcer, with drainage, redness).  All other systems reviewed and are negative.    Allergies  Levaquin  Home Medications   Current Outpatient Rx  Name  Route  Sig  Dispense  Refill  . clindamycin (CLEOCIN) 300 MG capsule   Oral   Take 1 capsule (300 mg total) by mouth every 8 (eight) hours.   30 capsule   0   . escitalopram (LEXAPRO) 10 MG tablet   Oral   Take 1 tablet (10 mg total) by mouth daily.   30 tablet   5   . esomeprazole (NEXIUM) 40 MG capsule   Oral   Take 1 capsule (40 mg total) by mouth daily before breakfast.   30 capsule   0   . insulin NPH-insulin regular (NOVOLIN 70/30) (70-30) 100 UNIT/ML injection   Subcutaneous   Inject 50-84 Units into the skin 2 (two) times daily with a meal. 84 units in the am and then 50 units in the evening   10 mL   5   . lisinopril (PRINIVIL,ZESTRIL) 20 MG tablet   Oral   Take 1 tablet (20 mg total) by mouth  daily.   30 tablet   5   . pioglitazone-metformin (ACTOPLUS MET) 15-850 MG per tablet   Oral   Take 1 tablet by mouth 2 (two) times daily with a meal.          BP 149/85  Pulse 108  Temp(Src) 98.2 F (36.8 C) (Oral)  Resp 20  Ht _0  (1.854 m)  Wt 270 lb (122.471 kg)  BMI 35.63 kg/m2  SpO2 98%  Vital signs normal except tachycardia   Physical Exam  Nursing note and vitals reviewed. Constitutional: He is oriented to person, place, and time. He appears well-developed and well-nourished.  Non-toxic appearance. He does not appear ill. No distress.  HENT:  Head: Normocephalic and atraumatic.  Right Ear: External ear normal.  Left Ear: External ear normal.  Nose: Nose normal. No mucosal edema or rhinorrhea.  Mouth/Throat: Oropharynx is clear and moist and mucous membranes are normal. No dental abscesses or uvula swelling.  Eyes: Conjunctivae and EOM are normal. Pupils are equal, round, and reactive to light.  Neck: Normal range of motion and full passive range of motion without pain. Neck supple.  Cardiovascular: Normal rate, regular rhythm and normal heart sounds.  Exam reveals no gallop and no friction rub.   No murmur heard. Pulmonary/Chest: Effort normal and breath sounds normal. No respiratory distress. He has no wheezes. He has no rhonchi. He has no rales. He exhibits no tenderness and no crepitus.  Abdominal: Soft. Normal appearance and bowel sounds are normal. He exhibits no distension. There is no tenderness. There is no rebound and no guarding.  Musculoskeletal: Normal range of motion. He exhibits no edema and no tenderness.  Moves all extremities well.   Neurological: He is alert and oriented to person, place, and time. He has normal strength. No cranial nerve deficit.  Skin: Skin is warm, dry and intact. No rash noted. No erythema. No pallor.  Area 8x10 mm open ulcer that appears to be just through the dermis on lateral aspect of left sole of the foot about 2.5 cm  proximal to flexor crease of left little toe. Mild swelling surrounding the wound that does not drain pus upon palpation, with red streak that comes up over dorsum of left foot and across the foot to medial aspect just below ankle.   See photos  Psychiatric: He has a normal mood and affect. His speech is normal and behavior is normal. His mood appears not anxious.  ED Course  Procedures (including critical care time) Medications  0.9 %  sodium chloride infusion (1,000 mLs Intravenous New Bag/Given 06/26/13 1900)    Followed by  0.9 %  sodium chloride infusion (not administered)  insulin regular (NOVOLIN R,HUMULIN R) 1 Units/mL in sodium chloride 0.9 % 100 mL infusion (not administered)  vancomycin (VANCOCIN) IVPB 1000 mg/200 mL premix (not administered)  piperacillin-tazobactam (ZOSYN) IVPB 3.375 g (not administered)     DIAGNOSTIC STUDIES: Oxygen Saturation is 98% on room air, normal by my interpretation.    COORDINATION OF CARE: 6:22 PM-Discussed treatment plan which includes admission with pt at bedside and pt agreed to plan. Will give pt IV fluids.  Patient started on an insulin drip for his hyperglycemia.  18:32 Dr Verlon Au, is familiar with patient, he states he will need a MR of his foot and a CRP. There is no MRI here over the weekend, will need to go to [facility].   Review of patient's prior admission showed he was treated with vancomycin and Zosyn which he states seemed to help his infection the past. This was ordered.  19:00 Dr Allyson Sabal, accepts in transfer to [facility] to med-surg team 10  Labs Review Results for orders placed during the hospital encounter of 06/26/13  CBC WITH DIFFERENTIAL      Result Value Range   WBC 4.0  4.0 - 10.5 K/uL   RBC 4.49  4.22 - 5.81 MIL/uL   Hemoglobin 12.4 (*) 13.0 - 17.0 g/dL   HCT 37.9 (*) 39.0 - 52.0 %   MCV 84.4  78.0 - 100.0 fL   MCH 27.6  26.0 - 34.0 pg   MCHC 32.7  30.0 - 36.0 g/dL   RDW 13.3  11.5 - 15.5 %   Platelets 239  150  - 400 K/uL   Neutrophils Relative % 58  43 - 77 %   Neutro Abs 2.4  1.7 - 7.7 K/uL   Lymphocytes Relative 31  12 - 46 %   Lymphs Abs 1.3  0.7 - 4.0 K/uL   Monocytes Relative 6  3 - 12 %   Monocytes Absolute 0.2  0.1 - 1.0 K/uL   Eosinophils Relative 4  0 - 5 %   Eosinophils Absolute 0.2  0.0 - 0.7 K/uL   Basophils Relative 1  0 - 1 %   Basophils Absolute 0.0  0.0 - 0.1 K/uL  BASIC METABOLIC PANEL      Result Value Range   Sodium 137  137 - 147 mEq/L   Potassium 4.5  3.7 - 5.3 mEq/L   Chloride 97  96 - 112 mEq/L   CO2 26  19 - 32 mEq/L   Glucose, Bld 422 (*) 70 - 99 mg/dL   BUN 9  6 - 23 mg/dL   Creatinine, Ser 0.72  0.50 - 1.35 mg/dL   Calcium 9.2  8.4 - 10.5 mg/dL   GFR calc non Af Amer >90  >90 mL/min   GFR calc Af Amer >90  >90 mL/min  GLUCOSE, CAPILLARY      Result Value Range   Glucose-Capillary 419 (*) 70 - 99 mg/dL   Comment 1 Documented in Chart     Laboratory interpretation all normal except hyperglycemia    Imaging Review Dg Foot Complete Left  06/24/2013   CLINICAL DATA:  Left foot pain.  Diabetic ulcer.  EXAM: LEFT FOOT - COMPLETE 3+ VIEW  COMPARISON:  None.  FINDINGS: There is no evidence of fracture or dislocation. There  is no evidence of arthropathy or other focal bone abnormality. Soft tissues are unremarkable.  IMPRESSION: Negative.   Electronically Signed   By: Earle Gell M.D.   On: 06/24/2013 19:54    Dg Foot Complete Left  06/20/2013   CLINICAL DATA:  Left foot pain. Ulceration at the lateral aspect of the left foot.  EXAM: LEFT FOOT - COMPLETE 3+ VIEW  COMPARISON:  Left foot radiographs performed 06/11/2013  FINDINGS: The known soft tissue ulceration is not well characterized on radiograph. No radiopaque foreign bodies are seen.  No osseous erosions are seen to suggest osteomyelitis. There is no evidence of fracture or dislocation. Visualized joint spaces are preserved. The subtalar joint is unremarkable in appearance.  IMPRESSION: Known soft tissue  ulceration not well characterized on radiograph. No radiopaque foreign bodies seen. No osseous erosions seen to suggest osteomyelitis.   Electronically Signed   By: Garald Balding M.D.   On: 06/20/2013 00:41   Dg Foot Complete Left  06/11/2013   CLINICAL DATA:  Cellulitis  EXAM: LEFT FOOT - COMPLETE 3+ VIEW  COMPARISON:  None.  FINDINGS: Frontal, oblique, and lateral views were obtained. There is no fracture or dislocation. Joint spaces appear intact. No erosive change or bony destruction. There is a small inferior calcaneal spur. No demonstrable soft tissue abscess.  IMPRESSION: No fracture or dislocation. Joint spaces appear intact. No bony destruction. Small inferior calcaneal spur. No soft tissue abscess appreciable.   Electronically Signed   By: Lowella Grip M.D.   On: 06/11/2013 13:07     EKG Interpretation   None       MDM   1. Diabetic infection of left foot   2. Hyperglycemia    Plan transfer to [facility] for admission   Rolland Porter, MD, FACEP   I personally performed the services described in this documentation, which was scribed in my presence. The recorded information has been reviewed and considered.  Rolland Porter, MD, FACEP    Christopher Norrie, MD 06/26/13 [place], MD 06/26/13 [phone]

## 2013-06-26 NOTE — ED Notes (Signed)
Pt c/o non healing foot ulcer. Pt seen in ED x 2 days ago-pt currently on po antibx. Pt states pain/drainage are worst and he now has red streaks on foot.

## 2013-06-26 NOTE — Progress Notes (Signed)
  ANTIBIOTIC CONSULT NOTE - INITIAL  Pharmacy Consult for vancomycin and zosyn  Indication: Diabetic foot ulcer  Allergies  Allergen Reactions  . Levaquin [Levofloxacin]     Rash on skin, not anaphylaxis    Patient Measurements: Height: 6\' 1"  (185.4 cm) Weight: 301 lb 9.4 oz (136.8 kg) IBW/kg (Calculated) : 79.9 Adjusted Body Weight:   Vital Signs: Temp: 98.3 F (36.8 C) (01/03 2219) Temp src: Oral (01/03 2219) BP: 168/109 mmHg (01/03 2219) Pulse Rate: 96 (01/03 2219) Intake/Output from previous day:   Intake/Output from this shift:    Labs:  Recent Labs  06/24/13 1921 06/26/13 1652  WBC 4.7 4.0  HGB 13.8 12.4*  PLT 274 239  CREATININE 0.82 0.72   Estimated Creatinine Clearance: 183.6 ml/min (by C-G formula based on Cr of 0.72). No results found for this basename: VANCOTROUGH, VANCOPEAK, VANCORANDOM, [place], GENTPEAK, [place], [place], TOBRAPEAK, TOBRARND, AMIKACINPEAK, AMIKACINTROU, AMIKACIN,  in the last 72 hours   Microbiology: Recent Results (from the past 720 hour(s))  CULTURE, BLOOD (ROUTINE X 2)     Status: None   Collection Time    06/20/13  2:48 AM      Result Value Range Status   Specimen Description BLOOD LEFT ANTECUBITAL   Final   Special Requests BOTTLES DRAWN AEROBIC AND ANAEROBIC [place]   Final   Culture NO GROWTH 5 DAYS   Final   Report Status 06/25/2013 FINAL   Final  CULTURE, BLOOD (ROUTINE X 2)     Status: None   Collection Time    06/20/13  2:52 AM      Result Value Range Status   Specimen Description BLOOD RIGHT HAND   Final   Special Requests BOTTLES DRAWN AEROBIC AND ANAEROBIC [place]   Final   Culture NO GROWTH 5 DAYS   Final   Report Status 06/25/2013 FINAL   Final    Medical History: Past Medical History  Diagnosis Date  . GERD (gastroesophageal reflux disease)   . Diabetes mellitus   . HTN (hypertension)   . Depression   . Anxiety   . Chronic pain   . ED (erectile dysfunction)   . Hyperlipidemia      Medications:  Prescriptions prior to admission  Medication Sig Dispense Refill  . clindamycin (CLEOCIN) 300 MG capsule Take 1 capsule (300 mg total) by mouth every 8 (eight) hours.  30 capsule  0  . escitalopram (LEXAPRO) 10 MG tablet Take 1 tablet (10 mg total) by mouth daily.  30 tablet  5  . esomeprazole (NEXIUM) 40 MG capsule Take 1 capsule (40 mg total) by mouth daily before breakfast.  30 capsule  0  . insulin NPH-insulin regular (NOVOLIN 70/30) (70-30) 100 UNIT/ML injection Inject 50-84 Units into the skin 2 (two) times daily with a meal. 84 units in the am and then 50 units in the evening  10 mL  5  . lisinopril (PRINIVIL,ZESTRIL) 20 MG tablet Take 1 tablet (20 mg total) by mouth daily.  30 tablet  5  . pioglitazone-metformin (ACTOPLUS MET) 15-850 MG per tablet Take 1 tablet by mouth 2 (two) times daily with a meal.       Assessment: Diabetic foot ulcer. Vanc and zosyn empirically. Pt underdosed at 1900 with 1gm vanc. (10mg /kg)   Goal of Therapy:  Vancomycin trough level 10-15 mcg/ml  Plan:  Vancomycin 2gm x1 now then 1500mg  q12h. Check trough before 4th dose. Zosyn 3.375gm q8h    Curlene Dolphin 06/26/2013,11:10 PM

## 2013-06-26 NOTE — ED Notes (Signed)
Report called to [facility], ETA of 25 min.

## 2013-06-27 DIAGNOSIS — R7309 Other abnormal glucose: Secondary | ICD-10-CM

## 2013-06-27 LAB — CBC
HEMATOCRIT: 32.2 % — AB (ref 39.0–52.0)
Hemoglobin: 11.1 g/dL — ABNORMAL LOW (ref 13.0–17.0)
MCH: 28.6 pg (ref 26.0–34.0)
MCHC: 34.5 g/dL (ref 30.0–36.0)
MCV: 83 fL (ref 78.0–100.0)
Platelets: 205 10*3/uL (ref 150–400)
RBC: 3.88 MIL/uL — ABNORMAL LOW (ref 4.22–5.81)
RDW: 13.5 % (ref 11.5–15.5)
WBC: 3.6 10*3/uL — ABNORMAL LOW (ref 4.0–10.5)

## 2013-06-27 LAB — GLUCOSE, CAPILLARY
Glucose-Capillary: 98 mg/dL (ref 70–99)
Glucose-Capillary: 102 mg/dL — ABNORMAL HIGH (ref 70–99)
Glucose-Capillary: 160 mg/dL — ABNORMAL HIGH (ref 70–99)
Glucose-Capillary: 159 mg/dL — ABNORMAL HIGH (ref 70–99)

## 2013-06-27 LAB — COMPREHENSIVE METABOLIC PANEL WITH GFR
ALT: 19 U/L (ref 0–53)
AST: 12 U/L (ref 0–37)
Albumin: 3.3 g/dL — ABNORMAL LOW (ref 3.5–5.2)
Alkaline Phosphatase: 55 U/L (ref 39–117)
BUN: 9 mg/dL (ref 6–23)
CO2: 27 meq/L (ref 19–32)
Calcium: 8.6 mg/dL (ref 8.4–10.5)
Chloride: 104 meq/L (ref 96–112)
Creatinine, Ser: 0.8 mg/dL (ref 0.50–1.35)
GFR calc Af Amer: >90 mL/min
GFR calc non Af Amer: >90 mL/min
Glucose, Bld: 111 mg/dL — ABNORMAL HIGH (ref 70–99)
Potassium: 3.8 meq/L (ref 3.7–5.3)
Sodium: 142 meq/L (ref 137–147)
Total Bilirubin: 0.3 mg/dL (ref 0.3–1.2)
Total Protein: 6 g/dL (ref 6.0–8.3)

## 2013-06-27 LAB — C-REACTIVE PROTEIN: CRP: <0.5 mg/dL — ABNORMAL LOW (ref <0.60)

## 2013-06-27 LAB — PROTIME-INR
INR: 0.97 (ref 0.00–1.49)
Prothrombin Time: 12.7 seconds (ref 11.6–15.2)

## 2013-06-27 MED ORDER — INSULIN ASPART PROT & ASPART (70-30 MIX) 100 UNIT/ML ~~LOC~~ SUSP
42.0000 [IU] | Freq: Every day | SUBCUTANEOUS | Status: DC
  Administered 2013-06-27 – 2013-06-29 (×3): 42 [IU] via SUBCUTANEOUS

## 2013-06-27 MED ORDER — INSULIN ASPART PROT & ASPART (70-30 MIX) 100 UNIT/ML ~~LOC~~ SUSP
40.0000 [IU] | Freq: Every day | SUBCUTANEOUS | Status: DC
  Administered 2013-06-28: 19:00:00 40 [IU] via SUBCUTANEOUS

## 2013-06-27 MED ORDER — INSULIN ASPART PROT & ASPART (70-30 MIX) 100 UNIT/ML ~~LOC~~ SUSP
30.0000 [IU] | Freq: Every day | SUBCUTANEOUS | Status: DC
  Administered 2013-06-27: 19:00:00 30 [IU] via SUBCUTANEOUS
  Filled 2013-06-27: qty 10

## 2013-06-27 NOTE — Progress Notes (Signed)
[facility] PROGRESS NOTE  Christopher Raymond GGE:[id number] DOB: September 05, 1975 DOA: 06/26/2013 PCP: Pcp Not In System  Assessment/Plan: 1. Diabetic ulcer. Patient presenting with complaints of a nonhealing ulcer, recently treated with broad-spectrum empiric IV antibiotic therapy with vancomycin and Zosyn on a hospitalization at [facility], noting some purulent drainage at home since then. There was concern for the possibility of osteomyelitis for which an MRI has been ordered and is pending at the time of this dictation. He did not have purulence or fluctuant masses on my evaluation today. We'll await results from CT. He remains on empiric IV antibiotic therapy with vancomycin and Zosyn. He did have a sedimentation rate of 4. 2. Type 2 diabetes mellitus, poorly controlled, hemoglobin A1c of 11.2 on 06/20/2013 with hyperglycemia. Patient having blood sugars in the 400s at his previous hospitalization, requiring IV insulin. Blood sugars appear to be coming down from 422 yesterday to 111 on this mornings lab work. 3. Hypertension. Blood pressure stable with his last blood pressures are 134/76. Continue lisinopril 20 mg by mouth daily   Code Status: Full code Disposition Plan: Continue broad-spectrum IV antibiotic therapy, supportive care, followup on MRI of foot.   Consultants:  Wound care   Antibiotics:  IV Zosyn (started on 06/26/2013)  IV vancomycin (started 06/26/2013)  HPI/Subjective: Patient is a 38 year old with a past medical history of type 2 diabetes mellitus, chronic ulceration of left foot, presented with complaints of discharge from left foot ulcer. Patient was recently admitted to [facility] where he was treated with IV vancomycin and Zosyn, discharged on oral clindamycin. Over the last 2 days prior to admission he reports noting a purulent discharge from his ulcer. This morning he states having some pain around the ulceration site, has not noted further purulent  drainage since admission. He is nontoxic, tolerating by mouth intake.  Objective: Filed Vitals:   06/27/13 1357  BP: 134/76  Pulse: 83  Temp: 98 F (36.7 C)  Resp: 20    Intake/Output Summary (Last 24 hours) at 06/27/13 1631 Last data filed at 06/27/13 1300  Gross per 24 hour  Intake    480 ml  Output      0 ml  Net    480 ml   Filed Weights   06/26/13 1605 06/26/13 2219  Weight: 122.471 kg (270 lb) 136.8 kg (301 lb 9.4 oz)    Exam:   General:  Patient is in no acute distress awake alert and oriented x3  Cardiovascular: Regular rate and rhythm normal S1-S2  Respiratory: Lungs are clear to auscultation bilaterally no wheezing rhonchi  Abdomen: Soft nontender nondistended positive bowel  Musculoskeletal:  Show left foot showing 2-3 cm ulcer over fifth metatarsal region, plantar surface, no evidence of purulence on my exam, having mild erythema.    Data Reviewed: Basic Metabolic Panel:  Recent Labs Lab 06/24/13 1921 06/26/13 1652 06/27/13 0415  NA 138 137 142  K 4.6 4.5 3.8  CL 96 97 104  CO2 27 26 27   GLUCOSE 332* 422* 111*  BUN 13 9 9   CREATININE 0.82 0.72 0.80  CALCIUM 9.8 9.2 8.6   Liver Function Tests:  Recent Labs Lab 06/27/13 0415  AST 12  ALT 19  ALKPHOS 55  BILITOT 0.3  PROT 6.0  ALBUMIN 3.3*   No results found for this basename: LIPASE, AMYLASE,  in the last 168 hours No results found for this basename: AMMONIA,  in the last 168 hours CBC:  Recent Labs Lab 06/21/13  3244 06/22/13 0539 06/24/13 1921 06/26/13 1652 06/27/13 0415  WBC 4.1 4.4 4.7 4.0 3.6*  NEUTROABS 1.9  --  2.6 2.4  --   HGB 11.9* 11.4* 13.8 12.4* 11.1*  HCT 35.2* 34.2* 41.3 37.9* 32.2*  MCV 83.4 83.6 84.1 84.4 83.0  PLT 204 216 274 239 205   Cardiac Enzymes: No results found for this basename: CKTOTAL, CKMB, CKMBINDEX, TROPONINI,  in the last 168 hours BNP (last 3 results) No results found for this basename: PROBNP,  in the last 8760 hours CBG:  Recent  Labs Lab 06/26/13 2058 06/26/13 2219 06/26/13 2354 06/27/13 0748 06/27/13 1225  GLUCAP 178* 161* 161* 98 102*    Recent Results (from the past 240 hour(s))  CULTURE, BLOOD (ROUTINE X 2)     Status: None   Collection Time    06/20/13  2:48 AM      Result Value Range Status   Specimen Description BLOOD LEFT ANTECUBITAL   Final   Special Requests BOTTLES DRAWN AEROBIC AND ANAEROBIC [place]   Final   Culture NO GROWTH 5 DAYS   Final   Report Status 06/25/2013 FINAL   Final  CULTURE, BLOOD (ROUTINE X 2)     Status: None   Collection Time    06/20/13  2:52 AM      Result Value Range Status   Specimen Description BLOOD RIGHT HAND   Final   Special Requests BOTTLES DRAWN AEROBIC AND ANAEROBIC [place]   Final   Culture NO GROWTH 5 DAYS   Final   Report Status 06/25/2013 FINAL   Final     Studies: No results found.  Scheduled Meds: . enoxaparin (LOVENOX) injection  40 mg Subcutaneous Q24H  . escitalopram  10 mg Oral Daily  . insulin aspart  0-20 Units Subcutaneous TID WC  . insulin aspart  0-5 Units Subcutaneous QHS  . insulin aspart protamine- aspart  30 Units Subcutaneous Q supper  . [START ON 06/28/2013] insulin aspart protamine- aspart  42 Units Subcutaneous Q breakfast  . lisinopril  20 mg Oral Daily  . pantoprazole  40 mg Oral Daily  . piperacillin-tazobactam (ZOSYN)  IV  3.375 g Intravenous Q8H  . vancomycin  1,500 mg Intravenous Q12H   Continuous Infusions: . sodium chloride 50 mL/hr at 06/27/13 0012    Principal Problem:   Non-healing ulcer Active Problems:   GERD (gastroesophageal reflux disease)   Essential hypertension, benign   Diabetes   Chronic low back pain   Diabetic foot infection    Time spent: 35 minutes    Kelvin Cellar  [facility] Pager [phone]. If 7PM-7AM, please contact night-coverage at [email or web address], password [facility] 06/27/2013, 4:31 PM  LOS: 1 day

## 2013-06-28 ENCOUNTER — Inpatient Hospital Stay (HOSPITAL_COMMUNITY): Payer: Medicare Other

## 2013-06-28 DIAGNOSIS — E118 Type 2 diabetes mellitus with unspecified complications: Secondary | ICD-10-CM

## 2013-06-28 DIAGNOSIS — IMO0002 Reserved for concepts with insufficient information to code with codable children: Secondary | ICD-10-CM

## 2013-06-28 DIAGNOSIS — E1165 Type 2 diabetes mellitus with hyperglycemia: Secondary | ICD-10-CM

## 2013-06-28 LAB — GLUCOSE, CAPILLARY
Glucose-Capillary: 79 mg/dL (ref 70–99)
Glucose-Capillary: 143 mg/dL — ABNORMAL HIGH (ref 70–99)
Glucose-Capillary: 200 mg/dL — ABNORMAL HIGH (ref 70–99)
Glucose-Capillary: 94 mg/dL (ref 70–99)

## 2013-06-28 MED ORDER — ENOXAPARIN SODIUM 80 MG/0.8ML ~~LOC~~ SOLN
70.0000 mg | SUBCUTANEOUS | Status: DC
  Administered 2013-06-29: 70 mg via SUBCUTANEOUS
  Filled 2013-06-28: qty 0.8

## 2013-06-28 MED ORDER — HYDROCODONE-ACETAMINOPHEN 5-325 MG PO TABS
1.0000 | ORAL_TABLET | Freq: Four times a day (QID) | ORAL | Status: DC | PRN
  Filled 2013-06-28 (×2): qty 1

## 2013-06-28 MED ORDER — ENOXAPARIN SODIUM 30 MG/0.3ML ~~LOC~~ SOLN
30.0000 mg | SUBCUTANEOUS | Status: AC
  Administered 2013-06-28: 10:00:00 30 mg via SUBCUTANEOUS
  Filled 2013-06-28: qty 0.3

## 2013-06-28 MED ORDER — GADOBENATE DIMEGLUMINE 529 MG/ML IV SOLN
20.0000 mL | Freq: Once | INTRAVENOUS | Status: AC
Start: 2013-06-28 — End: 2013-06-28
  Administered 2013-06-28: 14:00:00 20 mL via INTRAVENOUS

## 2013-06-28 NOTE — Progress Notes (Signed)
ANTICOAGULATION CONSULT NOTE - Initial Consult  Pharmacy Consult for lovenox Indication: VTE prophylaxis  Allergies  Allergen Reactions  . Levaquin [Levofloxacin]     Rash on skin, not anaphylaxis    Patient Measurements: Height: 6\' 1"  (185.4 cm) Weight: 301 lb 9.4 oz (136.8 kg) IBW/kg (Calculated) : 79.9   Vital Signs: Temp: 97.5 F (36.4 C) (01/05 0529) Temp src: Oral (01/05 0529) BP: 153/82 mmHg (01/05 1022) Pulse Rate: 73 (01/05 0529)  Labs:  Recent Labs  06/26/13 1652 06/27/13 0415  HGB 12.4* 11.1*  HCT 37.9* 32.2*  PLT 239 205  LABPROT  --  12.7  INR  --  0.97  CREATININE 0.72 0.80    Estimated Creatinine Clearance: 183.6 ml/min (by C-G formula based on Cr of 0.8).   Medical History: Past Medical History  Diagnosis Date  . GERD (gastroesophageal reflux disease)   . Diabetes mellitus   . HTN (hypertension)   . Depression   . Anxiety   . Chronic pain   . ED (erectile dysfunction)   . Hyperlipidemia     Assessment: Patient is a 38 y.o obese M admitted for diabetic foot ulcer.  Patient's BMI >30.  Scr 0.8 (est crcl~>100)  Goal of Therapy:  Anti-xa level 0.3-0.6 Monitor platelets by anticoagulation protocol: Yes   Plan:  1) change lovenox dose to 70mg  SQ daily (adjusted dose for high BMI, ~0.5mg /kg/day) 2) patient received lovenox 40mg  dose this morning around 10:30AM.  Will give an additional 30mg  now to get 70mg  total for today.  Srinidhi Landers P 06/28/2013,10:49 AM

## 2013-06-28 NOTE — Progress Notes (Addendum)
[facility] PROGRESS NOTE  Christopher Raymond ZOX:[id number] DOB: 12/19/1975 DOA: 06/26/2013 PCP: Pcp Not In System  Assessment/Plan: 1. Diabetic ulcer. Patient presenting with complaints of a nonhealing ulcer, recently treated with broad-spectrum empiric IV antibiotic therapy with vancomycin and Zosyn on a hospitalization at [facility], noting some purulent drainage at home since then. Fortunately MRI of left foot did not show evidence suggesting septic arthritis, osteomyelitis or mild fasciitis. Examination of foot ulcer today showing clinical improvement with decreased erythema. Plan to continue one more day of broad-spectrum IV antibiotic therapy, if remains stable/improved tomorrow will transition to oral antimicrobial therapy. Blood cultures remaining sterile. I filled out his prescription to obtain diabetic shoes on discharge. 2. Type 2 diabetes mellitus, poorly controlled, hemoglobin A1c of 11.2 on 06/20/2013 with hyperglycemia. Patient having blood sugars in the 400s at his previous hospitalization, requiring IV insulin. Patient's blood sugars remaining improved, as his highest  blood sugar reading in the last 24 hours has been 160. We'll continue his current regimen with insulin 7030 42 units in a.m., 40 units in p.m. 3. Hypertension. His blood pressures remain mostly in the 130s, we'll continue lisinopril 20 mg by mouth daily   Code Status: Full code Disposition Plan: We'll continue one more day of IV antibiotics, hopefully will be transitioned to oral antimicrobials and discharged in 24 hours.   Consultants:  Wound care   Antibiotics:  IV Zosyn (started on 06/26/2013)  IV vancomycin (started 06/26/2013)  HPI/Subjective: Patient is a 38 year old with a past medical history of type 2 diabetes mellitus, chronic ulceration of left foot, presented with complaints of discharge from left foot ulcer. Patient was recently admitted to [facility] where he was treated with IV  vancomycin and Zosyn, discharged on oral clindamycin. Over the last 2 days prior to admission he reports noting a purulent discharge from his ulcer. Patient states feeling better, ambulating around the hallway. He is tolerating by mouth intake, denies fevers chills nausea or vomiting..  Objective: Filed Vitals:   06/28/13 1022  BP: 153/82  Pulse:   Temp:   Resp:     Intake/Output Summary (Last 24 hours) at 06/28/13 1652 Last data filed at 06/28/13 1007  Gross per 24 hour  Intake    480 ml  Output      0 ml  Net    480 ml   Filed Weights   06/26/13 1605 06/26/13 2219  Weight: 122.471 kg (270 lb) 136.8 kg (301 lb 9.4 oz)    Exam:   General:  Patient is in no acute distress awake alert and oriented x3  Cardiovascular: Regular rate and rhythm normal S1-S2  Respiratory: Lungs are clear to auscultation bilaterally no wheezing rhonchi  Abdomen: Soft nontender nondistended positive bowel  Musculoskeletal:  Show left foot showing 2 cm shallow ulceration without purulence on my exam, overall improvement since yesterday.   Data Reviewed: Basic Metabolic Panel:  Recent Labs Lab 06/24/13 1921 06/26/13 1652 06/27/13 0415  NA 138 137 142  K 4.6 4.5 3.8  CL 96 97 104  CO2 27 26 27   GLUCOSE 332* 422* 111*  BUN 13 9 9   CREATININE 0.82 0.72 0.80  CALCIUM 9.8 9.2 8.6   Liver Function Tests:  Recent Labs Lab 06/27/13 0415  AST 12  ALT 19  ALKPHOS 55  BILITOT 0.3  PROT 6.0  ALBUMIN 3.3*   No results found for this basename: LIPASE, AMYLASE,  in the last 168 hours No results found for this basename:  AMMONIA,  in the last 168 hours CBC:  Recent Labs Lab 06/22/13 0539 06/24/13 1921 06/26/13 1652 06/27/13 0415  WBC 4.4 4.7 4.0 3.6*  NEUTROABS  --  2.6 2.4  --   HGB 11.4* 13.8 12.4* 11.1*  HCT 34.2* 41.3 37.9* 32.2*  MCV 83.6 84.1 84.4 83.0  PLT 216 274 239 205   Cardiac Enzymes: No results found for this basename: CKTOTAL, CKMB, CKMBINDEX, TROPONINI,  in the  last 168 hours BNP (last 3 results) No results found for this basename: PROBNP,  in the last 8760 hours CBG:  Recent Labs Lab 06/27/13 1225 06/27/13 1725 06/27/13 2126 06/28/13 0754 06/28/13 1115  GLUCAP 102* 160* 159* 79 143*    Recent Results (from the past 240 hour(s))  CULTURE, BLOOD (ROUTINE X 2)     Status: None   Collection Time    06/20/13  2:48 AM      Result Value Range Status   Specimen Description BLOOD LEFT ANTECUBITAL   Final   Special Requests BOTTLES DRAWN AEROBIC AND ANAEROBIC 6[place]   Final   Culture NO GROWTH 5 DAYS   Final   Report Status 06/25/2013 FINAL   Final  CULTURE, BLOOD (ROUTINE X 2)     Status: None   Collection Time    06/20/13  2:52 AM      Result Value Range Status   Specimen Description BLOOD RIGHT HAND   Final   Special Requests BOTTLES DRAWN AEROBIC AND ANAEROBIC 6CC   Final   Culture NO GROWTH 5 DAYS   Final   Report Status 06/25/2013 FINAL   Final  CULTURE, BLOOD (ROUTINE X 2)     Status: None   Collection Time    06/26/13 11:37 PM      Result Value Range Status   Specimen Description BLOOD RIGHT ARM   Final   Special Requests BOTTLES DRAWN AEROBIC AND ANAEROBIC 10CC EACH   Final   Culture  Setup Time     Final   Value: 06/27/2013 13:48     Performed at [facility]   Culture     Final   Value:        BLOOD CULTURE RECEIVED NO GROWTH TO DATE CULTURE WILL BE HELD FOR 5 DAYS BEFORE ISSUING A FINAL NEGATIVE REPORT     Performed at [facility]   Report Status PENDING   Incomplete  CULTURE, BLOOD (ROUTINE X 2)     Status: None   Collection Time    06/26/13 11:43 PM      Result Value Range Status   Specimen Description BLOOD RIGHT HAND   Final   Special Requests BOTTLES DRAWN AEROBIC AND ANAEROBIC 10CC EACH   Final   Culture  Setup Time     Final   Value: 06/27/2013 13:48     Performed at [facility]   Culture     Final   Value:        BLOOD CULTURE RECEIVED NO GROWTH TO DATE CULTURE WILL BE HELD FOR 5 DAYS  BEFORE ISSUING A FINAL NEGATIVE REPORT     Performed at [facility]   Report Status PENDING   Incomplete     Studies: Mr Foot Left W Wo Contrast  06/28/2013   CLINICAL DATA:  Nonhealing wound on the plantar surface of the foot.  EXAM: MRI OF THE LEFT FOREFOOT WITHOUT AND WITH CONTRAST  TECHNIQUE: Multiplanar, multisequence MR imaging was performed both before and after administration  of intravenous contrast.  CONTRAST:  56mL MULTIHANCE GADOBENATE DIMEGLUMINE 529 MG/ML IV SOLN  COMPARISON:  Radiographs 06/24/2013  FINDINGS: No acute bony findings are demonstrated. No evidence of osteomyelitis or septic arthritis. No discrete fluid collection to suggest a drainable abscess. There is a focal area of soft tissue thickening over the region of the 5th metatarsal phalangeal joint which is likely inflamed subcutaneous fat. Enhancing granulation tissue is also possible. I do not see a discrete abscess or obvious open wound.  The foot musculature is unremarkable. No findings for myofasciitis or pyomyositis. There is mild cellulitic change involving the dorsum of the foot.  IMPRESSION: No MR findings to suggest septic arthritis, osteomyelitis or myofasciitis.  Soft tissue thickening and enhancement along the plantar and lateral aspect of the 5th metatarsal head without discrete drainable abscess.   Electronically Signed   By: Kalman Jewels M.D.   On: 06/28/2013 14:34    Scheduled Meds: . [START ON 06/29/2013] enoxaparin (LOVENOX) injection  70 mg Subcutaneous Q24H  . escitalopram  10 mg Oral Daily  . insulin aspart  0-20 Units Subcutaneous TID WC  . insulin aspart  0-5 Units Subcutaneous QHS  . insulin aspart protamine- aspart  40 Units Subcutaneous Q supper  . insulin aspart protamine- aspart  42 Units Subcutaneous Q breakfast  . lisinopril  20 mg Oral Daily  . pantoprazole  40 mg Oral Daily  . piperacillin-tazobactam (ZOSYN)  IV  3.375 g Intravenous Q8H  . vancomycin  1,500 mg Intravenous Q12H    Continuous Infusions: . sodium chloride 50 mL/hr at 06/28/13 1534    Principal Problem:   Non-healing ulcer Active Problems:   GERD (gastroesophageal reflux disease)   Essential hypertension, benign   Diabetes   Chronic low back pain   Diabetic foot infection    Time spent: 35 minutes    Kelvin Cellar  [facility] Pager [phone]. If 7PM-7AM, please contact night-coverage at [email or web address], password [facility] 06/28/2013, 4:52 PM  LOS: 2 days

## 2013-06-28 NOTE — Consult Note (Signed)
WOC wound consult note Reason for Consult: evaluation of left foot wound. Pt with history of lateral plantar surface wound left foot x 30month.  Has been in PO and IV abtx.  He has pain with palpation of the wound, but it is not currently open or draining.  Wound type: neuropathic foot ulcer Measurement: 1.0cm x 1.0cm x 0 area with surrounding hyperkeratosis Wound bed: central calloused area but not open, no drainage expressed with palpation  Drainage (amount, consistency, odor) none Periwound:some mild erythema and noted minimal erythema on the lateral/dorsal foot Dressing procedure/placement/frequency: no topical care recommended at this time.  Pt. Will need routine follow up with podiatry or primary care to monitor. Offloading with specialty shoes would be warranted, he has script in the room that needs signature from MD for these. Explained to patient that no open wound currently, therefore would allow IV abtx to run course and see what the pending MRI shows which may or may not alter the POC.  If MRI positive for osteomyelitis will need further workup for tx per orthopedics or primary care.  Discussed POC with patient and bedside nurse.  Re consult if needed, will not follow at this time. Thanks  Lorine Iannaccone [place], [place] [phone])

## 2013-06-29 DIAGNOSIS — E119 Type 2 diabetes mellitus without complications: Secondary | ICD-10-CM

## 2013-06-29 LAB — GLUCOSE, CAPILLARY
GLUCOSE-CAPILLARY: 127 mg/dL — AB (ref 70–99)
GLUCOSE-CAPILLARY: 66 mg/dL — AB (ref 70–99)
Glucose-Capillary: 65 mg/dL — ABNORMAL LOW (ref 70–99)
Glucose-Capillary: 96 mg/dL (ref 70–99)

## 2013-06-29 LAB — CBC
HCT: 34 % — ABNORMAL LOW (ref 39.0–52.0)
HEMOGLOBIN: 11.3 g/dL — AB (ref 13.0–17.0)
MCH: 28.1 pg (ref 26.0–34.0)
MCHC: 33.2 g/dL (ref 30.0–36.0)
MCV: 84.6 fL (ref 78.0–100.0)
Platelets: 193 10*3/uL (ref 150–400)
RBC: 4.02 MIL/uL — ABNORMAL LOW (ref 4.22–5.81)
RDW: 13.6 % (ref 11.5–15.5)
WBC: 3.2 10*3/uL — ABNORMAL LOW (ref 4.0–10.5)

## 2013-06-29 MED ORDER — INSULIN ASPART PROT & ASPART (70-30 MIX) 100 UNIT/ML ~~LOC~~ SUSP: 40.0000 [IU] | Freq: Every day | SUBCUTANEOUS | Status: DC

## 2013-06-29 MED ORDER — AMOXICILLIN-POT CLAVULANATE 875-125 MG PO TABS: 1.0000 | ORAL_TABLET | Freq: Two times a day (BID) | ORAL | Status: DC

## 2013-06-29 MED ORDER — HYDROCODONE-ACETAMINOPHEN 5-325 MG PO TABS: 1.0000 | ORAL_TABLET | Freq: Four times a day (QID) | ORAL | Status: DC | PRN

## 2013-06-29 MED ORDER — INSULIN ASPART PROT & ASPART (70-30 MIX) 100 UNIT/ML ~~LOC~~ SUSP: 42.0000 [IU] | Freq: Every day | SUBCUTANEOUS | Status: DC

## 2013-06-29 NOTE — Progress Notes (Signed)
UR completed. Lister Brizzi RN CCM Case Mgmt 

## 2013-06-29 NOTE — Care Management Note (Signed)
    Page 1 of 1   06/29/2013     12:13:38 PM   CARE MANAGEMENT NOTE 06/29/2013  Patient:  Christopher Raymond, Christopher Raymond   Account Number:  [id number]  Date Initiated:  06/29/2013  Documentation initiated by:  Tomi Bamberger  Subjective/Objective Assessment:   dx non healing ulcer  admit- lives with spouse.  pta indep.     Action/Plan:   Anticipated DC Date:  06/29/2013   Anticipated DC Plan:  [place]  CM consult  Follow-up appt scheduled      Choice offered to / List presented to:             Status of service:  Completed, signed off Medicare Important Message given?   (If response is "NO", the following Medicare IM given date fields will be blank) Date Medicare IM given:   Date Additional Medicare IM given:    Discharge Disposition:  HOME/SELF CARE  Per UR Regulation:    If discussed at Long Length of Stay Meetings, dates discussed:    Comments:  06/29/13 12:12 Tomi Bamberger RN,BSN [phone] patient lives with spouse, pta indep. Patient has hospital follow up with [facility] physicians on 07/05/13. Patient for dc today.

## 2013-06-29 NOTE — Discharge Summary (Signed)
Physician Discharge Summary  [name] ITG:[id number] DOB: 10/04/1975 DOA: 06/26/2013  PCP: Pcp Not In System  Admit date: 06/26/2013 Discharge date: 06/29/2013  Time spent: 35 minutes  Recommendations for Outpatient Follow-up:  1. Please follow up on Diabetic Ulcer  Discharge Diagnoses:  Principal Problem:   Non-healing ulcer Active Problems:   GERD (gastroesophageal reflux disease)   Essential hypertension, benign   Diabetes   Chronic low back pain   Diabetic foot infection   Discharge Condition: Stable/Improved  Diet recommendation: Heart Healthy Diabetic Diet  Filed Weights   06/26/13 1605 06/26/13 2219  Weight: 122.471 kg (270 lb) 136.8 kg (301 lb 9.4 oz)    History of present illness:  Christopher Raymond is a 38 y.o. male with Past medical history of Diabetes mellitus, hypertension, chronic back pain, dyslipidemia, GERD.  The patient is coming from home.  The patient presents with complaints of discharge of pus Like material from a nonhealing ulcer On his left foot.He mentions that one month ago he Noticed an open area on his left foot which progressively grew in an ulcer.He was admitted to any pain hospital and was treated with vancomycin and Zosyn and since there was improvement in his ulcer he was discharged on oral clindamycin. After the discharge initially the ulcer was getting better but since last 2 days He started having increased pain on the left leg associated with the redness involving his daughter some of the food and warmth as well as discharge that was having puslike material since today.  Pt denies any fever, chills, headache, cough, chest pain, palpitation, shortness of breath, orthopnea, PND, nausea, vomiting, abdominal pain, diarrhea, constipation, active bleeding, burning urination, dizziness, pedal edema, focal neurological deficit.   Hospital Course:  Patient is a pleasant 38 year old gentleman with a past medical history of poorly controlled type II  diabetes mellitus, having a hemoglobin A1c of 11.2 on 06/20/2013, who presented as a transfer from [facility] for further evaluation of chronic diabetic ulcer involving left foot. Patient initially presented to that facility with complaints of discharge from his nonhealing ulcer, reporting purulent drainage from the site. He was started on broad-spectrum IV antibiotic therapy with vancomycin and Zosyn. Patient's hospitalization was complicated by the development of hyperglycemia for which she was started on IV insulin. At [facility] hospital an MRI of his foot was ordered to assess for the possibility of osteomyelitis. Fortunately this study which was performed on 06/28/2013 did not reveal evidence of septic arthritis, osteomyelitis or myofasciitis. There was soft tissue thickening and enhancement along the plantar and lateral aspect of the fifth metatarsal head without a discrete drainable abscess. Cellulitis surrounding the ulcer improved significantly, as wounds appeared clean, without evidence of purulent drainage. He remained afebrile, hemodynamically stable and was transitioned to oral antibiotics after receiving 3 days of IV antibiotics at this facility. Wound Care was consulted and was given an outpatient followup appointment in one week. Prior to discharge I filled a prescription for him to obtain diabetic shoes. By 06/29/2013 he was doing much better, blood sugars well controlled, tolerating by mouth intake, afebrile, nontoxic, discharged in stable condition. Case Manager was consulted to set him up with a local PCP for a follow up visit.    Consultations:  Wound care  Case manager  Discharge Exam: Filed Vitals:   06/29/13 1114  BP: 144/77  Pulse:   Temp:   Resp:     General: Patient is in no acute distress awake alert oriented, ambulating down  the hallway Cardiovascular: Regular rate and rhythm normal S1-S2 Respiratory: Lungs are clear to auscultation bilaterally Abdomen: Soft  nontender nondistended  Discharge Instructions      Discharge Orders   Future Orders Complete By Expires   Call MD for:  extreme fatigue  As directed    Call MD for:  persistant dizziness or light-headedness  As directed    Call MD for:  persistant nausea and vomiting  As directed    Call MD for:  redness, tenderness, or signs of infection (pain, swelling, redness, odor or green/yellow discharge around incision site)  As directed    Call MD for:  severe uncontrolled pain  As directed    Call MD for:  temperature >100.4  As directed    Diet - low sodium heart healthy  As directed    Increase activity slowly  As directed    MR Foot Left W Wo Contrast  As directed 08/25/2014   Questions:     Does the patient have a pacemaker, internal devices, implants, aneury:  No   Reason for Exam (SYMPTOM  OR DIAGNOSIS REQUIRED):  diabetic foot infection ? osteomyelitis on lateral aspect of foot   Preferred imaging location?:  [facility]       Medication List    STOP taking these medications       clindamycin 300 MG capsule  Commonly known as:  CLEOCIN     insulin NPH-regular Human (70-30) 100 UNIT/ML injection  Commonly known as:  NOVOLIN 70/30     pioglitazone-metformin 15-850 MG per tablet  Commonly known as:  ACTOPLUS MET      TAKE these medications       amoxicillin-clavulanate 875-125 MG per tablet  Commonly known as:  AUGMENTIN  Take 1 tablet by mouth 2 (two) times daily.     escitalopram 10 MG tablet  Commonly known as:  LEXAPRO  Take 1 tablet (10 mg total) by mouth daily.     esomeprazole 40 MG capsule  Commonly known as:  NEXIUM  Take 1 capsule (40 mg total) by mouth daily before breakfast.     HYDROcodone-acetaminophen 5-325 MG per tablet  Commonly known as:  NORCO/VICODIN  Take 1 tablet by mouth every 6 (six) hours as needed for moderate pain.     insulin aspart protamine- aspart (70-30) 100 UNIT/ML injection  Commonly known as:  NOVOLOG MIX 70/30  Inject  0.4 mLs (40 Units total) into the skin daily with supper.     insulin aspart protamine- aspart (70-30) 100 UNIT/ML injection  Commonly known as:  NOVOLOG MIX 70/30  Inject 0.42 mLs (42 Units total) into the skin daily with breakfast.     lisinopril 20 MG tablet  Commonly known as:  PRINIVIL,ZESTRIL  Take 1 tablet (20 mg total) by mouth daily.       Allergies  Allergen Reactions  . Levaquin [Levofloxacin]     Rash on skin, not anaphylaxis   Follow-up Information   Follow up with Pcp Not In System In 1 week.       The results of significant diagnostics from this hospitalization (including imaging, microbiology, ancillary and laboratory) are listed below for reference.    Significant Diagnostic Studies: Mr Foot Left W Wo Contrast  06/28/2013   CLINICAL DATA:  Nonhealing wound on the plantar surface of the foot.  EXAM: MRI OF THE LEFT FOREFOOT WITHOUT AND WITH CONTRAST  TECHNIQUE: Multiplanar, multisequence MR imaging was performed both before and after administration of intravenous  contrast.  CONTRAST:  52m MULTIHANCE GADOBENATE DIMEGLUMINE 529 MG/ML IV SOLN  COMPARISON:  Radiographs 06/24/2013  FINDINGS: No acute bony findings are demonstrated. No evidence of osteomyelitis or septic arthritis. No discrete fluid collection to suggest a drainable abscess. There is a focal area of soft tissue thickening over the region of the 5th metatarsal phalangeal joint which is likely inflamed subcutaneous fat. Enhancing granulation tissue is also possible. I do not see a discrete abscess or obvious open wound.  The foot musculature is unremarkable. No findings for myofasciitis or pyomyositis. There is mild cellulitic change involving the dorsum of the foot.  IMPRESSION: No MR findings to suggest septic arthritis, osteomyelitis or myofasciitis.  Soft tissue thickening and enhancement along the plantar and lateral aspect of the 5th metatarsal head without discrete drainable abscess.   Electronically Signed    By: MKalman JewelsM.D.   On: 06/28/2013 14:34   Dg Foot Complete Left  06/24/2013   CLINICAL DATA:  Left foot pain.  Diabetic ulcer.  EXAM: LEFT FOOT - COMPLETE 3+ VIEW  COMPARISON:  None.  FINDINGS: There is no evidence of fracture or dislocation. There is no evidence of arthropathy or other focal bone abnormality. Soft tissues are unremarkable.  IMPRESSION: Negative.   Electronically Signed   By: JEarle GellM.D.   On: 06/24/2013 19:54   Dg Foot Complete Left  06/20/2013   CLINICAL DATA:  Left foot pain. Ulceration at the lateral aspect of the left foot.  EXAM: LEFT FOOT - COMPLETE 3+ VIEW  COMPARISON:  Left foot radiographs performed 06/11/2013  FINDINGS: The known soft tissue ulceration is not well characterized on radiograph. No radiopaque foreign bodies are seen.  No osseous erosions are seen to suggest osteomyelitis. There is no evidence of fracture or dislocation. Visualized joint spaces are preserved. The subtalar joint is unremarkable in appearance.  IMPRESSION: Known soft tissue ulceration not well characterized on radiograph. No radiopaque foreign bodies seen. No osseous erosions seen to suggest osteomyelitis.   Electronically Signed   By: JGarald BaldingM.D.   On: 06/20/2013 00:41   Dg Foot Complete Left  06/11/2013   CLINICAL DATA:  Cellulitis  EXAM: LEFT FOOT - COMPLETE 3+ VIEW  COMPARISON:  None.  FINDINGS: Frontal, oblique, and lateral views were obtained. There is no fracture or dislocation. Joint spaces appear intact. No erosive change or bony destruction. There is a small inferior calcaneal spur. No demonstrable soft tissue abscess.  IMPRESSION: No fracture or dislocation. Joint spaces appear intact. No bony destruction. Small inferior calcaneal spur. No soft tissue abscess appreciable.   Electronically Signed   By: WLowella GripM.D.   On: 06/11/2013 13:07    Microbiology: Recent Results (from the past 240 hour(s))  CULTURE, BLOOD (ROUTINE X 2)     Status: None    Collection Time    06/20/13  2:48 AM      Result Value Range Status   Specimen Description BLOOD LEFT ANTECUBITAL   Final   Special Requests BOTTLES DRAWN AEROBIC AND ANAEROBIC 6CC   Final   Culture NO GROWTH 5 DAYS   Final   Report Status 06/25/2013 FINAL   Final  CULTURE, BLOOD (ROUTINE X 2)     Status: None   Collection Time    06/20/13  2:52 AM      Result Value Range Status   Specimen Description BLOOD RIGHT HAND   Final   Special Requests BOTTLES DRAWN AEROBIC AND ANAEROBIC [place]  Final  Culture NO GROWTH 5 DAYS   Final   Report Status 06/25/2013 FINAL   Final  CULTURE, BLOOD (ROUTINE X 2)     Status: None   Collection Time    06/26/13 11:37 PM      Result Value Range Status   Specimen Description BLOOD RIGHT ARM   Final   Special Requests BOTTLES DRAWN AEROBIC AND ANAEROBIC 10CC EACH   Final   Culture  Setup Time     Final   Value: 06/27/2013 13:48     Performed at [facility]   Culture     Final   Value:        BLOOD CULTURE RECEIVED NO GROWTH TO DATE CULTURE WILL BE HELD FOR 5 DAYS BEFORE ISSUING A FINAL NEGATIVE REPORT     Performed at [facility]   Report Status PENDING   Incomplete  CULTURE, BLOOD (ROUTINE X 2)     Status: None   Collection Time    06/26/13 11:43 PM      Result Value Range Status   Specimen Description BLOOD RIGHT HAND   Final   Special Requests BOTTLES DRAWN AEROBIC AND ANAEROBIC 10CC EACH   Final   Culture  Setup Time     Final   Value: 06/27/2013 13:48     Performed at [facility]   Culture     Final   Value:        BLOOD CULTURE RECEIVED NO GROWTH TO DATE CULTURE WILL BE HELD FOR 5 DAYS BEFORE ISSUING A FINAL NEGATIVE REPORT     Performed at [facility]   Report Status PENDING   Incomplete     Labs: Basic Metabolic Panel:  Recent Labs Lab 06/24/13 1921 06/26/13 1652 06/27/13 0415  NA 138 137 142  K 4.6 4.5 3.8  CL 96 97 104  CO2 _0 GLUCOSE 332* 422* 111*  BUN _1 CREATININE 0.82  0.72 0.80  CALCIUM 9.8 9.2 8.6   Liver Function Tests:  Recent Labs Lab 06/27/13 0415  AST 12  ALT 19  ALKPHOS 55  BILITOT 0.3  PROT 6.0  ALBUMIN 3.3*   No results found for this basename: LIPASE, AMYLASE,  in the last 168 hours No results found for this basename: AMMONIA,  in the last 168 hours CBC:  Recent Labs Lab 06/24/13 1921 06/26/13 1652 06/27/13 0415 06/29/13 0523  WBC 4.7 4.0 3.6* 3.2*  NEUTROABS 2.6 2.4  --   --   HGB 13.8 12.4* 11.1* 11.3*  HCT 41.3 37.9* 32.2* 34.0*  MCV 84.1 84.4 83.0 84.6  PLT 274 239 205 193   Cardiac Enzymes: No results found for this basename: CKTOTAL, CKMB, CKMBINDEX, TROPONINI,  in the last 168 hours BNP: BNP (last 3 results) No results found for this basename: PROBNP,  in the last 8760 hours CBG:  Recent Labs Lab 06/28/13 0754 06/28/13 1115 06/28/13 1708 06/28/13 2210 06/29/13 0754  GLUCAP 79 143* 200* 94 127*       Signed:  Jaunice Mirza  [facility] 06/29/2013, 11:28 AM

## 2013-06-29 NOTE — Progress Notes (Signed)
Hypoglycemic Event  CBG: 66  Treatment: 15 GM carbohydrate snack  Symptoms: Shaky  Follow-up CBG: Time:12:30 CBG Result: 96  Possible Reasons for Event: Possibly medications  Comments/MD notified: Dr. Coralyn Pear notified, will hold lunch insulin per MD. Will continue to monitor and follow protocol.    Karle Starch  Remember to initiate Hypoglycemia Order Set & complete

## 2013-07-03 LAB — CULTURE, BLOOD (ROUTINE X 2)
Culture: NO GROWTH
Culture: NO GROWTH

## 2013-07-11 ENCOUNTER — Encounter (HOSPITAL_COMMUNITY): Payer: Self-pay | Admitting: Emergency Medicine

## 2013-07-11 ENCOUNTER — Emergency Department (HOSPITAL_COMMUNITY)
Admission: EM | Admit: 2013-07-11 | Discharge: 2013-07-11 | Disposition: A | Discharge status: Home / Self Care | Payer: Medicare Other | Attending: Emergency Medicine | Admitting: Emergency Medicine

## 2013-07-11 DIAGNOSIS — F411 Generalized anxiety disorder: Secondary | ICD-10-CM | POA: Insufficient documentation

## 2013-07-11 DIAGNOSIS — F3289 Other specified depressive episodes: Secondary | ICD-10-CM | POA: Insufficient documentation

## 2013-07-11 DIAGNOSIS — E1169 Type 2 diabetes mellitus with other specified complication: Secondary | ICD-10-CM | POA: Insufficient documentation

## 2013-07-11 DIAGNOSIS — G8929 Other chronic pain: Secondary | ICD-10-CM | POA: Insufficient documentation

## 2013-07-11 DIAGNOSIS — Z79899 Other long term (current) drug therapy: Secondary | ICD-10-CM | POA: Insufficient documentation

## 2013-07-11 DIAGNOSIS — I10 Essential (primary) hypertension: Secondary | ICD-10-CM | POA: Insufficient documentation

## 2013-07-11 DIAGNOSIS — Z794 Long term (current) use of insulin: Secondary | ICD-10-CM | POA: Insufficient documentation

## 2013-07-11 DIAGNOSIS — Z9889 Other specified postprocedural states: Secondary | ICD-10-CM | POA: Insufficient documentation

## 2013-07-11 DIAGNOSIS — L089 Local infection of the skin and subcutaneous tissue, unspecified: Secondary | ICD-10-CM

## 2013-07-11 DIAGNOSIS — K219 Gastro-esophageal reflux disease without esophagitis: Secondary | ICD-10-CM | POA: Insufficient documentation

## 2013-07-11 DIAGNOSIS — L97509 Non-pressure chronic ulcer of other part of unspecified foot with unspecified severity: Secondary | ICD-10-CM | POA: Insufficient documentation

## 2013-07-11 DIAGNOSIS — Z87448 Personal history of other diseases of urinary system: Secondary | ICD-10-CM | POA: Insufficient documentation

## 2013-07-11 DIAGNOSIS — E11628 Type 2 diabetes mellitus with other skin complications: Secondary | ICD-10-CM

## 2013-07-11 DIAGNOSIS — F329 Major depressive disorder, single episode, unspecified: Secondary | ICD-10-CM | POA: Insufficient documentation

## 2013-07-11 LAB — BASIC METABOLIC PANEL
BUN: 11 mg/dL (ref 6–23)
CALCIUM: 9.6 mg/dL (ref 8.4–10.5)
CO2: 27 meq/L (ref 19–32)
CREATININE: 0.78 mg/dL (ref 0.50–1.35)
Chloride: 101 mEq/L (ref 96–112)
GFR calc Af Amer: >90 mL/min (ref >90)
GFR calc non Af Amer: >90 mL/min (ref >90)
Glucose, Bld: 236 mg/dL — ABNORMAL HIGH (ref 70–99)
Potassium: 3.7 mEq/L (ref 3.7–5.3)
Sodium: 141 mEq/L (ref 137–147)

## 2013-07-11 LAB — CBC WITH DIFFERENTIAL/PLATELET
BASOS ABS: 0 10*3/uL (ref 0.0–0.1)
BASOS PCT: 0 % (ref 0–1)
EOS PCT: 0 % (ref 0–5)
Eosinophils Absolute: 0 10*3/uL (ref 0.0–0.7)
HEMATOCRIT: 36.9 % — AB (ref 39.0–52.0)
Hemoglobin: 12.8 g/dL — ABNORMAL LOW (ref 13.0–17.0)
Lymphocytes Relative: 28 % (ref 12–46)
Lymphs Abs: 1.3 10*3/uL (ref 0.7–4.0)
MCH: 28.8 pg (ref 26.0–34.0)
MCHC: 34.7 g/dL (ref 30.0–36.0)
MCV: 83.1 fL (ref 78.0–100.0)
MONO ABS: 0.2 10*3/uL (ref 0.1–1.0)
MONOS PCT: 5 % (ref 3–12)
Neutro Abs: 3.1 10*3/uL (ref 1.7–7.7)
Neutrophils Relative %: 67 % (ref 43–77)
Platelets: 211 10*3/uL (ref 150–400)
RBC: 4.44 MIL/uL (ref 4.22–5.81)
RDW: 13.1 % (ref 11.5–15.5)
WBC: 4.6 10*3/uL (ref 4.0–10.5)

## 2013-07-11 LAB — GLUCOSE, CAPILLARY: Glucose-Capillary: 240 mg/dL — ABNORMAL HIGH (ref 70–99)

## 2013-07-11 MED ORDER — HYDROCODONE-ACETAMINOPHEN 5-325 MG PO TABS: 1.0000 | ORAL_TABLET | Freq: Four times a day (QID) | ORAL | Status: DC | PRN

## 2013-07-11 MED ORDER — VANCOMYCIN HCL IN DEXTROSE 1-5 GM/200ML-% IV SOLN
1000.0000 mg | Freq: Once | INTRAVENOUS | Status: AC
  Administered 2013-07-11: 1000 mg via INTRAVENOUS
  Filled 2013-07-11: qty 200

## 2013-07-11 MED ORDER — HYDROMORPHONE HCL PF 1 MG/ML IJ SOLN
1.0000 mg | Freq: Once | INTRAMUSCULAR | Status: AC
  Administered 2013-07-11: 1 mg via INTRAVENOUS
  Filled 2013-07-11: qty 1

## 2013-07-11 MED ORDER — SODIUM CHLORIDE 0.9 % IV SOLN: INTRAVENOUS | Status: DC

## 2013-07-11 MED ORDER — SODIUM CHLORIDE 0.9 % IV BOLUS (SEPSIS)
250.0000 mL | Freq: Once | INTRAVENOUS | Status: AC
  Administered 2013-07-11: 250 mL via INTRAVENOUS

## 2013-07-11 MED ORDER — AMOXICILLIN-POT CLAVULANATE 875-125 MG PO TABS: 1.0000 | ORAL_TABLET | Freq: Two times a day (BID) | ORAL | Status: DC

## 2013-07-11 NOTE — Discharge Instructions (Signed)
Cellulitis Cellulitis is an infection of the skin and the tissue under the skin. The infected area is usually red and tender. This happens most often in the arms and lower legs. HOME CARE   Take your antibiotic medicine as told. Finish the medicine even if you start to feel better.  Keep the infected arm or leg raised (elevated).  Put a warm cloth on the area up to 4 times per day.  Only take medicines as told by your doctor.  Keep all doctor visits as told. GET HELP RIGHT AWAY IF:   You have a fever.  You feel very sleepy.  You throw up (vomit) or have watery poop (diarrhea).  You feel sick and have muscle aches and pains.  You see red streaks on the skin coming from the infected area.  Your red area gets bigger or turns a dark color.  Your bone or joint under the infected area is painful after the skin heals.  Your infection comes back in the same area or different area.  You have a puffy (swollen) bump in the infected area.  You have new symptoms. MAKE SURE YOU:   Understand these instructions.  Will watch your condition.  Will get help right away if you are not doing well or get worse. Document Released: 11/27/2007 Document Revised: 12/10/2011 Document Reviewed: 08/26/2011 [facility] Patient Information 2014 [place], [place].  Take antibiotic as directed. Return for any new or worse signs of infection to the left foot. Elevate it is much as possible. Soak it in warm water for 20 minutes twice a day as we discussed. Followup with wound care clinic this week as scheduled. Again return for any worse symptoms. Take pain medicine as directed.

## 2013-07-11 NOTE — ED Provider Notes (Signed)
CSN: [id number]     Arrival date & time 07/11/13  1456 History   First MD Initiated Contact with Patient 07/11/13 1612     Chief Complaint  Patient presents with  . Foot Ulcer   (Consider location/radiation/quality/duration/timing/severity/associated sxs/prior Treatment) The history is provided by the patient and the spouse.   patient is a 38 year old male currently staying with his mother in the [place] area. Patient has been struggling with a left diabetic foot ulcer since December. Patient was admitted December 27 and then readmitted January 3 through January 7. Patient's wound was improving he is also followed by wound care every Thursday. Patient finished Augmentin on Friday after 10 days. Today patient with increased pain at the site of the ulcer and had some bloody purulent discharge. Now starting to get some slight red streaking on the anterior part of the foot as before. Patient is a diabetic. Patient denies any fevers.  Past Medical History  Diagnosis Date  . GERD (gastroesophageal reflux disease)   . Diabetes mellitus   . HTN (hypertension)   . Depression   . Anxiety   . Chronic pain   . ED (erectile dysfunction)   . Hyperlipidemia    Past Surgical History  Procedure Laterality Date  . Knee surgery    . Back surgery    . Cholecystectomy  2009  . Tonsillectomy    . Cholecystectomy open     Family History  Problem Relation Age of Onset  . Coronary artery disease Father   . Heart attack Father    History  Substance Use Topics  . Smoking status: Never Smoker   . Smokeless tobacco: Current User    Types: Snuff     Comment: dip x 20  . Alcohol Use: Yes     Comment: very little 4-5 beers/yr    Review of Systems  Constitutional: Negative for fever.  HENT: Negative for congestion.   Eyes: Negative for redness.  Respiratory: Negative for shortness of breath.   Cardiovascular: Negative for chest pain.  Gastrointestinal: Negative for nausea, vomiting and abdominal  pain.  Genitourinary: Negative for dysuria.  Musculoskeletal: Negative for myalgias.  Skin: Positive for wound.  Neurological: Negative for headaches.  Hematological: Does not bruise/bleed easily.  Psychiatric/Behavioral: Negative for confusion.    Allergies  Levaquin  Home Medications   Current Outpatient Rx  Name  Route  Sig  Dispense  Refill  . escitalopram (LEXAPRO) 10 MG tablet   Oral   Take 1 tablet (10 mg total) by mouth daily.   30 tablet   5   . esomeprazole (NEXIUM) 40 MG capsule   Oral   Take 1 capsule (40 mg total) by mouth daily before breakfast.   30 capsule   0   . insulin aspart protamine- aspart (NOVOLOG MIX 70/30) (70-30) 100 UNIT/ML injection   Subcutaneous   Inject 0.4 mLs (40 Units total) into the skin daily with supper.   10 mL   11   . insulin aspart protamine- aspart (NOVOLOG MIX 70/30) (70-30) 100 UNIT/ML injection   Subcutaneous   Inject 0.42 mLs (42 Units total) into the skin daily with breakfast.   10 mL   11   . lisinopril (PRINIVIL,ZESTRIL) 20 MG tablet   Oral   Take 1 tablet (20 mg total) by mouth daily.   30 tablet   5   . amoxicillin-clavulanate (AUGMENTIN) 875-125 MG per tablet   Oral   Take 1 tablet by mouth every 12 (twelve) hours.  14 tablet   0   . HYDROcodone-acetaminophen (NORCO/VICODIN) 5-325 MG per tablet   Oral   Take 1-2 tablets by mouth every 6 (six) hours as needed for moderate pain.   20 tablet   0    BP 167/85  Pulse 76  Temp(Src) 98.5 F (36.9 C) (Oral)  Resp 20  Ht 6\' 1"  (1.854 m)  Wt 289 lb 8 oz (131.316 kg)  BMI 38.20 kg/m2  SpO2 98% Physical Exam  Nursing note and vitals reviewed. Constitutional: He is oriented to person, place, and time. He appears well-developed and well-nourished. No distress.  HENT:  Head: Normocephalic and atraumatic.  Mouth/Throat: Oropharynx is clear and moist.  Eyes: Conjunctivae and EOM are normal. Pupils are equal, round, and reactive to light.  Neck: Normal  range of motion.  Cardiovascular: Normal rate, regular rhythm and normal heart sounds.   No murmur heard. Pulmonary/Chest: Effort normal and breath sounds normal. No respiratory distress.  Abdominal: Soft. Bowel sounds are normal.  Musculoskeletal: He exhibits tenderness.  Patient's left foot with a 2 cm size skin lesion just proximal to the fifth toe on the ball of the foot. Slight tenderness there no discharge currently. Slight erythema streaking up to the anterior part of the foot not going above the ankle. Dorsalis pedis pulse is 2+ Refill is 1 second. No significant edema. No crepitance.  Neurological: He is alert and oriented to person, place, and time. No cranial nerve deficit. He exhibits normal muscle tone. Coordination normal.  Skin: Skin is warm. There is erythema.    ED Course  Procedures (including critical care time) Labs Review Labs Reviewed  GLUCOSE, CAPILLARY - Abnormal; Notable for the following:    Glucose-Capillary 240 (*)    All other components within normal limits  CBC WITH DIFFERENTIAL - Abnormal; Notable for the following:    Hemoglobin 12.8 (*)    HCT 36.9 (*)    All other components within normal limits  BASIC METABOLIC PANEL - Abnormal; Notable for the following:    Glucose, Bld 236 (*)    All other components within normal limits   Results for orders placed during the hospital encounter of 07/11/13  GLUCOSE, CAPILLARY      Result Value Range   Glucose-Capillary 240 (*) 70 - 99 mg/dL  CBC WITH DIFFERENTIAL      Result Value Range   WBC 4.6  4.0 - 10.5 K/uL   RBC 4.44  4.22 - 5.81 MIL/uL   Hemoglobin 12.8 (*) 13.0 - 17.0 g/dL   HCT 36.9 (*) 39.0 - 52.0 %   MCV 83.1  78.0 - 100.0 fL   MCH 28.8  26.0 - 34.0 pg   MCHC 34.7  30.0 - 36.0 g/dL   RDW 13.1  11.5 - 15.5 %   Platelets 211  150 - 400 K/uL   Neutrophils Relative % 67  43 - 77 %   Neutro Abs 3.1  1.7 - 7.7 K/uL   Lymphocytes Relative 28  12 - 46 %   Lymphs Abs 1.3  0.7 - 4.0 K/uL   Monocytes  Relative 5  3 - 12 %   Monocytes Absolute 0.2  0.1 - 1.0 K/uL   Eosinophils Relative 0  0 - 5 %   Eosinophils Absolute 0.0  0.0 - 0.7 K/uL   Basophils Relative 0  0 - 1 %   Basophils Absolute 0.0  0.0 - 0.1 K/uL  BASIC METABOLIC PANEL      Result Value  Range   Sodium 141  137 - 147 mEq/L   Potassium 3.7  3.7 - 5.3 mEq/L   Chloride 101  96 - 112 mEq/L   CO2 27  19 - 32 mEq/L   Glucose, Bld 236 (*) 70 - 99 mg/dL   BUN 11  6 - 23 mg/dL   Creatinine, Ser 0.78  0.50 - 1.35 mg/dL   Calcium 9.6  8.4 - 10.5 mg/dL   GFR calc non Af Amer >90  >90 mL/min   GFR calc Af Amer >90  >90 mL/min    Imaging Review No results found.  EKG Interpretation   None       MDM   1. Diabetic foot infection    Patient with a left foot diabetic ulcer followed by wound care. Has had recent admission [facility] for treatment of the infection that was January 3 2 January 7. Patient recently finished a course of Augmentin 3-4 days ago. Today the wound area started to have increased pain and he had a bloody purulent discharge. Patient has some slight red streaking coming up on the anterior part of the foot no significant cellulitis. Patient has x-rays of the foot in the past not showing any osteomyelitis. Patient received 1 g of vancomycin here. Patient will be restarted on Augmentin. Patient given very careful precautions to return for any new or worse signs of infection. Patient also the foot for 20 minutes twice a day in warm water. Patient will continue his followup with wound care once a week on Thursdays. In 2 days we will know whether the antibiotics are helpful patient will return if anything gets worse at all. Patient's blood sugars are reasonably controlled here today no significant leukocytosis.      Mervin Kung, MD 07/11/13 [phone]

## 2013-07-11 NOTE — ED Notes (Signed)
Pt states diabetic foot ulcer to left foot.  Pt States, "It busted back open this morning."

## 2013-08-09 ENCOUNTER — Emergency Department (HOSPITAL_COMMUNITY)
Admission: EM | Admit: 2013-08-09 | Discharge: 2013-08-09 | Disposition: A | Discharge status: Home / Self Care | Payer: Medicare Other | Attending: Emergency Medicine | Admitting: Emergency Medicine

## 2013-08-09 ENCOUNTER — Encounter (HOSPITAL_COMMUNITY): Payer: Self-pay | Admitting: Emergency Medicine

## 2013-08-09 DIAGNOSIS — Z794 Long term (current) use of insulin: Secondary | ICD-10-CM | POA: Insufficient documentation

## 2013-08-09 DIAGNOSIS — F411 Generalized anxiety disorder: Secondary | ICD-10-CM | POA: Insufficient documentation

## 2013-08-09 DIAGNOSIS — E1169 Type 2 diabetes mellitus with other specified complication: Secondary | ICD-10-CM | POA: Insufficient documentation

## 2013-08-09 DIAGNOSIS — G8929 Other chronic pain: Secondary | ICD-10-CM | POA: Insufficient documentation

## 2013-08-09 DIAGNOSIS — L97509 Non-pressure chronic ulcer of other part of unspecified foot with unspecified severity: Secondary | ICD-10-CM | POA: Insufficient documentation

## 2013-08-09 DIAGNOSIS — E11621 Type 2 diabetes mellitus with foot ulcer: Secondary | ICD-10-CM

## 2013-08-09 DIAGNOSIS — L97529 Non-pressure chronic ulcer of other part of left foot with unspecified severity: Secondary | ICD-10-CM

## 2013-08-09 DIAGNOSIS — K219 Gastro-esophageal reflux disease without esophagitis: Secondary | ICD-10-CM | POA: Insufficient documentation

## 2013-08-09 DIAGNOSIS — F329 Major depressive disorder, single episode, unspecified: Secondary | ICD-10-CM | POA: Insufficient documentation

## 2013-08-09 DIAGNOSIS — F3289 Other specified depressive episodes: Secondary | ICD-10-CM | POA: Insufficient documentation

## 2013-08-09 DIAGNOSIS — Z79899 Other long term (current) drug therapy: Secondary | ICD-10-CM | POA: Insufficient documentation

## 2013-08-09 DIAGNOSIS — I10 Essential (primary) hypertension: Secondary | ICD-10-CM | POA: Insufficient documentation

## 2013-08-09 DIAGNOSIS — Z87448 Personal history of other diseases of urinary system: Secondary | ICD-10-CM | POA: Insufficient documentation

## 2013-08-09 MED ORDER — OXYCODONE-ACETAMINOPHEN 5-325 MG PO TABS: 1.0000 | ORAL_TABLET | ORAL | Status: DC | PRN

## 2013-08-09 MED ORDER — AMOXICILLIN-POT CLAVULANATE 875-125 MG PO TABS: 1.0000 | ORAL_TABLET | Freq: Two times a day (BID) | ORAL | Status: DC

## 2013-08-09 MED ORDER — NAPROXEN 500 MG PO TABS: 500.0000 mg | ORAL_TABLET | Freq: Two times a day (BID) | ORAL | Status: DC

## 2013-08-09 MED ORDER — OXYCODONE-ACETAMINOPHEN 5-325 MG PO TABS
2.0000 | ORAL_TABLET | Freq: Once | ORAL | Status: AC
  Administered 2013-08-09: 2 via ORAL
  Filled 2013-08-09: qty 2

## 2013-08-09 MED ORDER — AMOXICILLIN-POT CLAVULANATE 875-125 MG PO TABS
1.0000 | ORAL_TABLET | Freq: Once | ORAL | Status: AC
  Administered 2013-08-09: 1 via ORAL
  Filled 2013-08-09: qty 1

## 2013-08-09 NOTE — ED Notes (Signed)
Pt. Reports ulcer on left foot starting in December. Pt. States he was hospitalized twice for antibiotic treatments.

## 2013-08-09 NOTE — ED Notes (Signed)
Pt. Has a quarter size wound to bottom of left foot below the little toe. Small amount of redness noted surrounding the wound. No drainage noted on assessment, but pt. Reports puss and blood draining from wound. Pt. Reports that he is seeing a wound doctor for care of the wound.

## 2013-08-09 NOTE — ED Provider Notes (Signed)
CSN: [id number]     Arrival date & time 08/09/13  0054 History   First MD Initiated Contact with Patient 08/09/13 0103     Chief Complaint  Patient presents with  . Foot Ulcer     (Consider location/radiation/quality/duration/timing/severity/associated sxs/prior Treatment) HPI Comments: 38 year old diabetic male presents with a complaint of left foot pain. He has a chronic slowly healing ulcer to the plantar aspect of the lateral left foot just proximal to the base of the fifth toe. This has been documented in the past as erythematous and draining purulent material, he had this wound debrided 5 days ago at the wound care center in [facility]. He has recently been on antibiotics last month but none in the last couple of weeks. He denies fevers chills nausea or vomiting, the pain stays in the foot, when he walks it makes it worse, when he rests it improves. He does not a small amount of discharge from the wound today. He states his blood sugar today was 150. He states he is compliant with his medications. He has had no pain medication prior to arrival  The history is provided by the patient and medical records.    Past Medical History  Diagnosis Date  . GERD (gastroesophageal reflux disease)   . Diabetes mellitus   . HTN (hypertension)   . Depression   . Anxiety   . Chronic pain   . ED (erectile dysfunction)   . Hyperlipidemia    Past Surgical History  Procedure Laterality Date  . Knee surgery    . Back surgery    . Cholecystectomy  2009  . Tonsillectomy    . Cholecystectomy open     Family History  Problem Relation Age of Onset  . Coronary artery disease Father   . Heart attack Father    History  Substance Use Topics  . Smoking status: Never Smoker   . Smokeless tobacco: Current User    Types: Snuff     Comment: dip x 20  . Alcohol Use: Yes     Comment: very little 4-5 beers/yr    Review of Systems  Constitutional: Negative for fever and chills.   Gastrointestinal: Negative for nausea.  Skin: Positive for wound.      Allergies  Levaquin  Home Medications   Current Outpatient Rx  Name  Route  Sig  Dispense  Refill  . amoxicillin-clavulanate (AUGMENTIN) 875-125 MG per tablet   Oral   Take 1 tablet by mouth every 12 (twelve) hours.   14 tablet   0   . amoxicillin-clavulanate (AUGMENTIN) 875-125 MG per tablet   Oral   Take 1 tablet by mouth every 12 (twelve) hours.   14 tablet   0   . escitalopram (LEXAPRO) 10 MG tablet   Oral   Take 1 tablet (10 mg total) by mouth daily.   30 tablet   5   . esomeprazole (NEXIUM) 40 MG capsule   Oral   Take 1 capsule (40 mg total) by mouth daily before breakfast.   30 capsule   0   . HYDROcodone-acetaminophen (NORCO/VICODIN) 5-325 MG per tablet   Oral   Take 1-2 tablets by mouth every 6 (six) hours as needed for moderate pain.   20 tablet   0   . insulin aspart protamine- aspart (NOVOLOG MIX 70/30) (70-30) 100 UNIT/ML injection   Subcutaneous   Inject 0.4 mLs (40 Units total) into the skin daily with supper.   10 mL   11   .  insulin aspart protamine- aspart (NOVOLOG MIX 70/30) (70-30) 100 UNIT/ML injection   Subcutaneous   Inject 0.42 mLs (42 Units total) into the skin daily with breakfast.   10 mL   11   . lisinopril (PRINIVIL,ZESTRIL) 20 MG tablet   Oral   Take 1 tablet (20 mg total) by mouth daily.   30 tablet   5   . naproxen (NAPROSYN) 500 MG tablet   Oral   Take 1 tablet (500 mg total) by mouth 2 (two) times daily with a meal.   30 tablet   0   . oxyCODONE-acetaminophen (PERCOCET) 5-325 MG per tablet   Oral   Take 1 tablet by mouth every 4 (four) hours as needed.   10 tablet   0    BP 186/96  Pulse 96  Temp(Src) 98.2 F (36.8 C) (Oral)  Resp 20  SpO2 96% Physical Exam  Nursing note and vitals reviewed. Constitutional: He appears well-developed and well-nourished. No distress.  HENT:  Head: Normocephalic and atraumatic.  Eyes: Conjunctivae  are normal. Right eye exhibits no discharge. Left eye exhibits no discharge. No scleral icterus.  Cardiovascular: Normal rate, regular rhythm and normal heart sounds.   No murmur heard. Pulmonary/Chest: Effort normal and breath sounds normal. No respiratory distress. He has no wheezes. He has no rales.  Musculoskeletal: He exhibits tenderness ( Tenderness over the ulcer on the plantar aspect of the left foot). He exhibits no edema.  Skin: Skin is warm and dry. He is not diaphoretic.  1 cm in diameter healing ulcer to the plantar aspect of the lateral left foot. Minimal surrounding erythema, no purulent material from the wound, no blood from the wound, no foul-smell from the wound.    ED Course  Procedures (including critical care time) Labs Review Labs Reviewed - No data to display Imaging Review No results found.  EKG Interpretation   None       MDM   Final diagnoses:  Diabetic ulcer of left foot    The patient's exam consistent with a diabetic ulcer of the left foot. This seems consistent with prior exams, admission to the hospital from 2 months ago reviewed, MRI at that time showed no osteomyelitis and the wound appears to be healing slowly. There is minimal surrounding erythema and no streaking to suggest a significant or serious infection. He is hypertensive but not tachycardic or febrile. Patient informed of these results, will need followup as an outpatient, local family doctor list given as the patient has recently relocated here.   Meds given in ED:  Medications  amoxicillin-clavulanate (AUGMENTIN) 875-125 MG per tablet 1 tablet (not administered)  oxyCODONE-acetaminophen (PERCOCET/ROXICET) 5-325 MG per tablet 2 tablet (not administered)    New Prescriptions   AMOXICILLIN-CLAVULANATE (AUGMENTIN) 875-125 MG PER TABLET    Take 1 tablet by mouth every 12 (twelve) hours.   NAPROXEN (NAPROSYN) 500 MG TABLET    Take 1 tablet (500 mg total) by mouth 2 (two) times daily with  a meal.   OXYCODONE-ACETAMINOPHEN (PERCOCET) 5-325 MG PER TABLET    Take 1 tablet by mouth every 4 (four) hours as needed.        Johnna Acosta, MD 08/09/13 [phone]

## 2013-08-09 NOTE — Discharge Instructions (Signed)
Please call your doctor for a followup appointment within 24-48 hours. When you talk to your doctor please let them know that you were seen in the emergency department and have them acquire all of your records so that they can discuss the findings with you and formulate a treatment plan to fully care for your new and ongoing problems. ° °[facility] Doctor List ° ° ° °Edward Hawkins MD. Specialty: Pulmonary Disease Contact information: [address]  °[address]  °[place] [address]  °[phone]  ° °Margaret Simpson, MD. Specialty: Family Medicine Contact information: [address]  °[place] [address]  °[phone]  ° °Scott Luking, MD. Specialty: Family Medicine Contact information: [address]  °[address]  °[place] [address]  °[phone]  ° °Tesfaye Fanta, MD Specialty: Internal Medicine Contact information: [address]  °[place] [address]  °[phone]  ° °Zach Hall, MD. Specialty: Internal Medicine Contact information: [address]  °[place] [address]  °[phone]  ° °Angus Mcinnis, MD. Specialty: Family Medicine Contact information: [address]  °[place] [address]  °[phone]  ° °Stephen Knowlton, MD. Specialty: Family Medicine Contact information: [address]  °[address]  °[place] [address]  °[phone]  ° °Roy Fagan, MD. Specialty: Internal Medicine Contact information: [address]  °[address]  ° [place] [address]  °[phone]  ° ° °

## 2013-09-28 ENCOUNTER — Encounter (HOSPITAL_COMMUNITY): Payer: Self-pay | Admitting: Emergency Medicine

## 2013-09-28 ENCOUNTER — Emergency Department (HOSPITAL_COMMUNITY): Payer: Medicare Other

## 2013-09-28 DIAGNOSIS — E1169 Type 2 diabetes mellitus with other specified complication: Principal | ICD-10-CM

## 2013-09-28 DIAGNOSIS — F329 Major depressive disorder, single episode, unspecified: Secondary | ICD-10-CM | POA: Diagnosis present

## 2013-09-28 DIAGNOSIS — I10 Essential (primary) hypertension: Secondary | ICD-10-CM | POA: Diagnosis present

## 2013-09-28 DIAGNOSIS — A4901 Methicillin susceptible Staphylococcus aureus infection, unspecified site: Secondary | ICD-10-CM | POA: Diagnosis present

## 2013-09-28 DIAGNOSIS — L03119 Cellulitis of unspecified part of limb: Secondary | ICD-10-CM

## 2013-09-28 DIAGNOSIS — E871 Hypo-osmolality and hyponatremia: Secondary | ICD-10-CM | POA: Diagnosis present

## 2013-09-28 DIAGNOSIS — Z79899 Other long term (current) drug therapy: Secondary | ICD-10-CM

## 2013-09-28 DIAGNOSIS — G8929 Other chronic pain: Secondary | ICD-10-CM | POA: Diagnosis present

## 2013-09-28 DIAGNOSIS — L02619 Cutaneous abscess of unspecified foot: Secondary | ICD-10-CM | POA: Diagnosis present

## 2013-09-28 DIAGNOSIS — Z794 Long term (current) use of insulin: Secondary | ICD-10-CM

## 2013-09-28 DIAGNOSIS — Z8249 Family history of ischemic heart disease and other diseases of the circulatory system: Secondary | ICD-10-CM

## 2013-09-28 DIAGNOSIS — K219 Gastro-esophageal reflux disease without esophagitis: Secondary | ICD-10-CM | POA: Diagnosis present

## 2013-09-28 DIAGNOSIS — F3289 Other specified depressive episodes: Secondary | ICD-10-CM | POA: Diagnosis present

## 2013-09-28 DIAGNOSIS — M545 Low back pain, unspecified: Secondary | ICD-10-CM | POA: Diagnosis present

## 2013-09-28 DIAGNOSIS — E785 Hyperlipidemia, unspecified: Secondary | ICD-10-CM | POA: Diagnosis present

## 2013-09-28 DIAGNOSIS — M86179 Other acute osteomyelitis, unspecified ankle and foot: Secondary | ICD-10-CM | POA: Diagnosis present

## 2013-09-28 DIAGNOSIS — L97509 Non-pressure chronic ulcer of other part of unspecified foot with unspecified severity: Secondary | ICD-10-CM | POA: Diagnosis present

## 2013-09-28 DIAGNOSIS — I96 Gangrene, not elsewhere classified: Secondary | ICD-10-CM | POA: Diagnosis present

## 2013-09-28 DIAGNOSIS — M9080 Osteopathy in diseases classified elsewhere, unspecified site: Secondary | ICD-10-CM | POA: Diagnosis present

## 2013-09-28 DIAGNOSIS — Z91199 Patient's noncompliance with other medical treatment and regimen due to unspecified reason: Secondary | ICD-10-CM

## 2013-09-28 DIAGNOSIS — E876 Hypokalemia: Secondary | ICD-10-CM | POA: Diagnosis present

## 2013-09-28 DIAGNOSIS — Z9119 Patient's noncompliance with other medical treatment and regimen: Secondary | ICD-10-CM

## 2013-09-28 DIAGNOSIS — IMO0002 Reserved for concepts with insufficient information to code with codable children: Principal | ICD-10-CM | POA: Diagnosis present

## 2013-09-28 DIAGNOSIS — F411 Generalized anxiety disorder: Secondary | ICD-10-CM | POA: Diagnosis present

## 2013-09-28 DIAGNOSIS — E1165 Type 2 diabetes mellitus with hyperglycemia: Principal | ICD-10-CM | POA: Diagnosis present

## 2013-09-28 DIAGNOSIS — D638 Anemia in other chronic diseases classified elsewhere: Secondary | ICD-10-CM | POA: Diagnosis present

## 2013-09-28 MED ORDER — VANCOMYCIN HCL IN DEXTROSE 1-5 GM/200ML-% IV SOLN
1000.0000 mg | Freq: Once | INTRAVENOUS | Status: AC
  Administered 2013-09-29: 1000 mg via INTRAVENOUS
  Filled 2013-09-28: qty 200

## 2013-09-28 MED ORDER — FENTANYL CITRATE 0.05 MG/ML IJ SOLN
50.0000 ug | INTRAMUSCULAR | Status: DC | PRN
  Administered 2013-09-29: 50 ug via INTRAVENOUS
  Filled 2013-09-28: qty 2

## 2013-09-28 MED ORDER — ONDANSETRON HCL 4 MG/2ML IJ SOLN
4.0000 mg | Freq: Once | INTRAMUSCULAR | Status: AC
  Administered 2013-09-29: 4 mg via INTRAVENOUS
  Filled 2013-09-28: qty 2

## 2013-09-28 MED ORDER — ACETAMINOPHEN 325 MG PO TABS
650.0000 mg | ORAL_TABLET | Freq: Once | ORAL | Status: AC
  Administered 2013-09-28: 650 mg via ORAL
  Filled 2013-09-28: qty 2

## 2013-09-28 NOTE — ED Provider Notes (Signed)
CSN: [id number]     Arrival date & time 09/28/13  2207 History  This chart was scribed for Teressa Lower, MD by Maree Erie, ED Scribe. The patient was seen in room Room/bed info not found. Patient's care was started at 11:40 PM.    Chief Complaint  Patient presents with  . Foot Ulcer      Patient is a 38 y.o. male presenting with abscess. The history is provided by the patient. No language interpreter was used.  Abscess Location:  Foot Foot abscess location:  Top of L foot Abscess quality: painful and redness   Duration:  2 days Progression:  Worsening Pain details:    Duration:  2 days   Progression:  Worsening Chronicity:  Recurrent Context: diabetes   Associated symptoms: fever and vomiting     HPI Comments: Christopher Raymond is a 38 y.o. male who presents to the Emergency Department complaining of an intermittent left foot ulcer that first appeared four months ago but reappeared last night. He reports warmth, swelling, pain and redness to the affected area. He has also vomited six times today and has a fever.   His PCP is in [place].    Past Medical History  Diagnosis Date  . GERD (gastroesophageal reflux disease)   . Diabetes mellitus   . HTN (hypertension)   . Depression   . Anxiety   . Chronic pain   . ED (erectile dysfunction)   . Hyperlipidemia    Past Surgical History  Procedure Laterality Date  . Knee surgery    . Back surgery    . Cholecystectomy  2009  . Tonsillectomy    . Cholecystectomy open     Family History  Problem Relation Age of Onset  . Coronary artery disease Father   . Heart attack Father    History  Substance Use Topics  . Smoking status: Never Smoker   . Smokeless tobacco: Current User    Types: Snuff     Comment: dip x 20  . Alcohol Use: Yes     Comment: very little 4-5 beers/yr    Review of Systems  Constitutional: Positive for fever.  Gastrointestinal: Positive for vomiting.  Skin: Positive for wound.  All other  systems reviewed and are negative.      Allergies  Levaquin  Home Medications   Current Outpatient Rx  Name  Route  Sig  Dispense  Refill  . amoxicillin-clavulanate (AUGMENTIN) 875-125 MG per tablet   Oral   Take 1 tablet by mouth every 12 (twelve) hours.   14 tablet   0   . amoxicillin-clavulanate (AUGMENTIN) 875-125 MG per tablet   Oral   Take 1 tablet by mouth every 12 (twelve) hours.   14 tablet   0   . escitalopram (LEXAPRO) 10 MG tablet   Oral   Take 1 tablet (10 mg total) by mouth daily.   30 tablet   5   . esomeprazole (NEXIUM) 40 MG capsule   Oral   Take 1 capsule (40 mg total) by mouth daily before breakfast.   30 capsule   0   . HYDROcodone-acetaminophen (NORCO/VICODIN) 5-325 MG per tablet   Oral   Take 1-2 tablets by mouth every 6 (six) hours as needed for moderate pain.   20 tablet   0   . insulin aspart protamine- aspart (NOVOLOG MIX 70/30) (70-30) 100 UNIT/ML injection   Subcutaneous   Inject 0.4 mLs (40 Units total) into the skin daily with supper.  10 mL   11   . insulin aspart protamine- aspart (NOVOLOG MIX 70/30) (70-30) 100 UNIT/ML injection   Subcutaneous   Inject 0.42 mLs (42 Units total) into the skin daily with breakfast.   10 mL   11   . lisinopril (PRINIVIL,ZESTRIL) 20 MG tablet   Oral   Take 1 tablet (20 mg total) by mouth daily.   30 tablet   5   . naproxen (NAPROSYN) 500 MG tablet   Oral   Take 1 tablet (500 mg total) by mouth 2 (two) times daily with a meal.   30 tablet   0   . oxyCODONE-acetaminophen (PERCOCET) 5-325 MG per tablet   Oral   Take 1 tablet by mouth every 4 (four) hours as needed.   10 tablet   0    BP 135/88  Pulse 133  Temp(Src) 100 F (37.8 C) (Oral)  Resp 18  Ht 6\' 1"  (1.854 m)  Wt 280 lb (127.007 kg)  BMI 36.95 kg/m2  SpO2 100% Physical Exam  Nursing note and vitals reviewed. Constitutional: He is oriented to person, place, and time. He appears well-developed and well-nourished. No  distress.  HENT:  Head: Normocephalic and atraumatic.  Mouth/Throat: Mucous membranes are normal.  Eyes: EOM are normal.  Neck: Normal range of motion. Neck supple. No tracheal deviation present.  Cardiovascular: Regular rhythm and normal heart sounds.  Tachycardia present.   Pulmonary/Chest: Effort normal. No respiratory distress.  Musculoskeletal: Normal range of motion.  Neurological: He is alert and oriented to person, place, and time.  Skin: Skin is warm and dry. There is erythema.  Over left foot ulcer to the plantar aspect under the 5th metatarsal. Deep with purulent drainage. Surrounding erythema up to mid calf.   Psychiatric: He has a normal mood and affect. His behavior is normal.    ED Course  Procedures (including critical care time)  DIAGNOSTIC STUDIES: Oxygen Saturation is 100% on room air, normal by my interpretation.    COORDINATION OF CARE: 11:44 PM -Patient requesting that he be admitted to the [facility].  Patient verbalizes understanding and agrees with treatment plan.  Labs Review Labs Reviewed  CBC - Abnormal; Notable for the following:    Hemoglobin 11.9 (*)    HCT 35.7 (*)    All other components within normal limits  COMPREHENSIVE METABOLIC PANEL - Abnormal; Notable for the following:    Sodium 129 (*)    Chloride 89 (*)    Glucose, Bld 452 (*)    Albumin 3.3 (*)    All other components within normal limits  I-STAT CHEM 8, ED - Abnormal; Notable for the following:    Sodium 130 (*)    Chloride 93 (*)    Glucose, Bld 437 (*)    Calcium, Ion 1.08 (*)    All other components within normal limits  HEMOGLOBIN A1C   Imaging Review Dg Foot Complete Left  09/29/2013   CLINICAL DATA:  Ulceration at the lateral aspect of the left foot.  EXAM: LEFT FOOT - COMPLETE 3+ VIEW  COMPARISON:  Left foot MRI performed 06/28/2013, and left foot radiographs performed 06/24/2013  FINDINGS: There is no evidence of fracture or dislocation. There is no evidence of osseous  erosion. The joint spaces are preserved. There is no evidence of talar subluxation; the subtalar joint is unremarkable in appearance.  Diffuse soft tissue swelling is noted about the foot. Known soft tissue ulceration is not well characterized on radiograph. No radiopaque foreign  bodies are seen.  IMPRESSION: No evidence of fracture or dislocation. No evidence of osseous erosion. No radiopaque foreign bodies seen.   Electronically Signed   By: Garald Balding M.D.   On: 09/29/2013 00:49   IV vancomycin. Tylenol. IV Zofran. IV fentanyl.  Labs and imaging reviewed. Discussed with Dr. Arnoldo Morale will admit   MDM   Diagnosis: Diabetic ulcer left foot with cellulitis  I personally performed the services described in this documentation, which was scribed in my presence. The recorded information has been reviewed and is accurate.     Teressa Lower, MD 09/29/13 [phone]

## 2013-09-28 NOTE — ED Notes (Signed)
Pt reporting foot ulcer on left foot.  States that foot is having some increased swelling and pain.

## 2013-09-29 ENCOUNTER — Inpatient Hospital Stay (HOSPITAL_COMMUNITY)
Admission: EM | Admit: 2013-09-29 | Discharge: 2013-10-08 | DRG: 623 | Disposition: A | Discharge status: Skilled Nursing Facility | Payer: Medicare Other | Attending: Internal Medicine | Admitting: Internal Medicine

## 2013-09-29 ENCOUNTER — Inpatient Hospital Stay (HOSPITAL_COMMUNITY): Payer: Medicare Other

## 2013-09-29 ENCOUNTER — Encounter (HOSPITAL_COMMUNITY): Payer: Self-pay | Admitting: Internal Medicine

## 2013-09-29 DIAGNOSIS — L03119 Cellulitis of unspecified part of limb: Secondary | ICD-10-CM

## 2013-09-29 DIAGNOSIS — L02419 Cutaneous abscess of limb, unspecified: Secondary | ICD-10-CM

## 2013-09-29 DIAGNOSIS — L97509 Non-pressure chronic ulcer of other part of unspecified foot with unspecified severity: Secondary | ICD-10-CM

## 2013-09-29 DIAGNOSIS — I10 Essential (primary) hypertension: Secondary | ICD-10-CM

## 2013-09-29 DIAGNOSIS — E11628 Type 2 diabetes mellitus with other skin complications: Secondary | ICD-10-CM

## 2013-09-29 DIAGNOSIS — E1169 Type 2 diabetes mellitus with other specified complication: Secondary | ICD-10-CM

## 2013-09-29 DIAGNOSIS — L089 Local infection of the skin and subcutaneous tissue, unspecified: Secondary | ICD-10-CM

## 2013-09-29 DIAGNOSIS — R7309 Other abnormal glucose: Secondary | ICD-10-CM

## 2013-09-29 DIAGNOSIS — L98499 Non-pressure chronic ulcer of skin of other sites with unspecified severity: Secondary | ICD-10-CM

## 2013-09-29 DIAGNOSIS — L0291 Cutaneous abscess, unspecified: Secondary | ICD-10-CM

## 2013-09-29 DIAGNOSIS — R1110 Vomiting, unspecified: Secondary | ICD-10-CM

## 2013-09-29 DIAGNOSIS — E119 Type 2 diabetes mellitus without complications: Secondary | ICD-10-CM | POA: Diagnosis present

## 2013-09-29 DIAGNOSIS — M86179 Other acute osteomyelitis, unspecified ankle and foot: Secondary | ICD-10-CM

## 2013-09-29 DIAGNOSIS — M545 Low back pain, unspecified: Secondary | ICD-10-CM

## 2013-09-29 DIAGNOSIS — L97529 Non-pressure chronic ulcer of other part of left foot with unspecified severity: Secondary | ICD-10-CM

## 2013-09-29 DIAGNOSIS — K625 Hemorrhage of anus and rectum: Secondary | ICD-10-CM

## 2013-09-29 DIAGNOSIS — R739 Hyperglycemia, unspecified: Secondary | ICD-10-CM | POA: Diagnosis present

## 2013-09-29 DIAGNOSIS — L03116 Cellulitis of left lower limb: Secondary | ICD-10-CM

## 2013-09-29 DIAGNOSIS — K219 Gastro-esophageal reflux disease without esophagitis: Secondary | ICD-10-CM

## 2013-09-29 DIAGNOSIS — E871 Hypo-osmolality and hyponatremia: Secondary | ICD-10-CM

## 2013-09-29 DIAGNOSIS — L0390 Cellulitis, unspecified: Secondary | ICD-10-CM

## 2013-09-29 DIAGNOSIS — M869 Osteomyelitis, unspecified: Secondary | ICD-10-CM

## 2013-09-29 DIAGNOSIS — G8929 Other chronic pain: Secondary | ICD-10-CM | POA: Diagnosis present

## 2013-09-29 DIAGNOSIS — E11621 Type 2 diabetes mellitus with foot ulcer: Secondary | ICD-10-CM | POA: Diagnosis present

## 2013-09-29 HISTORY — DX: Type 2 diabetes mellitus with foot ulcer: E11.621

## 2013-09-29 HISTORY — DX: Non-pressure chronic ulcer of other part of unspecified foot with unspecified severity: L97.509

## 2013-09-29 LAB — CBC
HCT: 32.5 % — ABNORMAL LOW (ref 39.0–52.0)
HCT: 35.7 % — ABNORMAL LOW (ref 39.0–52.0)
Hemoglobin: 11.1 g/dL — ABNORMAL LOW (ref 13.0–17.0)
Hemoglobin: 11.9 g/dL — ABNORMAL LOW (ref 13.0–17.0)
MCH: 27.1 pg (ref 26.0–34.0)
MCH: 27.3 pg (ref 26.0–34.0)
MCHC: 33.3 g/dL (ref 30.0–36.0)
MCHC: 33.5 g/dL (ref 30.0–36.0)
MCV: 81.3 fL (ref 78.0–100.0)
MCV: 81.5 fL (ref 78.0–100.0)
PLATELETS: 206 10*3/uL (ref 150–400)
PLATELETS: 263 10*3/uL (ref 150–400)
RBC: 3.99 MIL/uL — AB (ref 4.22–5.81)
RBC: 4.39 MIL/uL (ref 4.22–5.81)
RDW: 12.7 % (ref 11.5–15.5)
RDW: 12.7 % (ref 11.5–15.5)
WBC: 6.5 10*3/uL (ref 4.0–10.5)
WBC: 9.1 10*3/uL (ref 4.0–10.5)

## 2013-09-29 LAB — COMPREHENSIVE METABOLIC PANEL
ALT: 11 U/L (ref 0–53)
AST: 10 U/L (ref 0–37)
Albumin: 3.3 g/dL — ABNORMAL LOW (ref 3.5–5.2)
Alkaline Phosphatase: 74 U/L (ref 39–117)
BUN: 9 mg/dL (ref 6–23)
CALCIUM: 8.8 mg/dL (ref 8.4–10.5)
CO2: 26 meq/L (ref 19–32)
Chloride: 89 mEq/L — ABNORMAL LOW (ref 96–112)
Creatinine, Ser: 0.77 mg/dL (ref 0.50–1.35)
GFR calc Af Amer: >90 mL/min (ref >90)
GFR calc non Af Amer: >90 mL/min (ref >90)
Glucose, Bld: 452 mg/dL — ABNORMAL HIGH (ref 70–99)
Potassium: 3.7 mEq/L (ref 3.7–5.3)
SODIUM: 129 meq/L — AB (ref 137–147)
Total Bilirubin: 0.6 mg/dL (ref 0.3–1.2)
Total Protein: 7.4 g/dL (ref 6.0–8.3)

## 2013-09-29 LAB — I-STAT CHEM 8, ED
BUN: 8 mg/dL (ref 6–23)
CALCIUM ION: 1.08 mmol/L — AB (ref 1.12–1.23)
CHLORIDE: 93 meq/L — AB (ref 96–112)
Creatinine, Ser: 0.7 mg/dL (ref 0.50–1.35)
GLUCOSE: 437 mg/dL — AB (ref 70–99)
HCT: 39 % (ref 39.0–52.0)
Hemoglobin: 13.3 g/dL (ref 13.0–17.0)
Potassium: 3.7 mEq/L (ref 3.7–5.3)
Sodium: 130 mEq/L — ABNORMAL LOW (ref 137–147)
TCO2: 22 mmol/L (ref 0–100)

## 2013-09-29 LAB — GLUCOSE, CAPILLARY
GLUCOSE-CAPILLARY: 269 mg/dL — AB (ref 70–99)
Glucose-Capillary: 255 mg/dL — ABNORMAL HIGH (ref 70–99)
Glucose-Capillary: 256 mg/dL — ABNORMAL HIGH (ref 70–99)

## 2013-09-29 LAB — BASIC METABOLIC PANEL
BUN: 9 mg/dL (ref 6–23)
CO2: 28 meq/L (ref 19–32)
Calcium: 8.6 mg/dL (ref 8.4–10.5)
Chloride: 94 mEq/L — ABNORMAL LOW (ref 96–112)
Creatinine, Ser: 0.71 mg/dL (ref 0.50–1.35)
GFR calc Af Amer: >90 mL/min (ref >90)
Glucose, Bld: 301 mg/dL — ABNORMAL HIGH (ref 70–99)
Potassium: 3.7 mEq/L (ref 3.7–5.3)
Sodium: 135 mEq/L — ABNORMAL LOW (ref 137–147)

## 2013-09-29 LAB — HEMOGLOBIN A1C
Hgb A1c MFr Bld: 12.1 % — ABNORMAL HIGH (ref <5.7)
MEAN PLASMA GLUCOSE: 301 mg/dL — AB (ref <117)

## 2013-09-29 MED ORDER — ESCITALOPRAM OXALATE 10 MG PO TABS
10.0000 mg | ORAL_TABLET | Freq: Every day | ORAL | Status: DC
  Administered 2013-09-29 – 2013-10-08 (×10): 10 mg via ORAL
  Filled 2013-09-29 (×10): qty 1

## 2013-09-29 MED ORDER — DOCUSATE SODIUM 100 MG PO CAPS
100.0000 mg | ORAL_CAPSULE | Freq: Two times a day (BID) | ORAL | Status: DC
  Administered 2013-09-29 – 2013-10-08 (×19): 100 mg via ORAL
  Filled 2013-09-29 (×19): qty 1

## 2013-09-29 MED ORDER — ONDANSETRON HCL 4 MG/2ML IJ SOLN
4.0000 mg | Freq: Four times a day (QID) | INTRAMUSCULAR | Status: DC | PRN
  Administered 2013-09-29 – 2013-10-04 (×2): 4 mg via INTRAVENOUS
  Filled 2013-09-29 (×2): qty 2

## 2013-09-29 MED ORDER — OXYCODONE HCL 5 MG PO TABS
5.0000 mg | ORAL_TABLET | ORAL | Status: DC | PRN
  Administered 2013-09-29: 5 mg via ORAL
  Filled 2013-09-29: qty 1

## 2013-09-29 MED ORDER — PANTOPRAZOLE SODIUM 40 MG PO TBEC
40.0000 mg | DELAYED_RELEASE_TABLET | Freq: Every day | ORAL | Status: DC
  Administered 2013-09-29 – 2013-10-08 (×10): 40 mg via ORAL
  Filled 2013-09-29 (×11): qty 1

## 2013-09-29 MED ORDER — OXYCODONE HCL 5 MG PO TABS
5.0000 mg | ORAL_TABLET | ORAL | Status: DC | PRN
  Administered 2013-09-29 – 2013-10-01 (×10): 10 mg via ORAL
  Filled 2013-09-29 (×10): qty 2

## 2013-09-29 MED ORDER — INSULIN ASPART 100 UNIT/ML ~~LOC~~ SOLN
3.0000 [IU] | Freq: Once | SUBCUTANEOUS | Status: AC
  Administered 2013-09-29: 3 [IU] via SUBCUTANEOUS

## 2013-09-29 MED ORDER — ALUM & MAG HYDROXIDE-SIMETH 200-200-20 MG/5ML PO SUSP: 30.0000 mL | Freq: Four times a day (QID) | ORAL | Status: DC | PRN

## 2013-09-29 MED ORDER — OXYCODONE HCL ER 20 MG PO T12A
40.0000 mg | EXTENDED_RELEASE_TABLET | Freq: Two times a day (BID) | ORAL | Status: DC
  Administered 2013-09-29 – 2013-10-08 (×19): 40 mg via ORAL
  Filled 2013-09-29 (×19): qty 2

## 2013-09-29 MED ORDER — ONDANSETRON HCL 4 MG PO TABS
4.0000 mg | ORAL_TABLET | Freq: Four times a day (QID) | ORAL | Status: DC | PRN
Start: 2013-09-29 — End: 2013-10-08

## 2013-09-29 MED ORDER — INSULIN ASPART PROT & ASPART (70-30 MIX) 100 UNIT/ML ~~LOC~~ SUSP
40.0000 [IU] | Freq: Every day | SUBCUTANEOUS | Status: DC
  Filled 2013-09-29: qty 10

## 2013-09-29 MED ORDER — GADOBENATE DIMEGLUMINE 529 MG/ML IV SOLN
20.0000 mL | Freq: Once | INTRAVENOUS | Status: AC | PRN
  Administered 2013-09-29: 20 mL via INTRAVENOUS

## 2013-09-29 MED ORDER — ACETAMINOPHEN 650 MG RE SUPP: 650.0000 mg | Freq: Four times a day (QID) | RECTAL | Status: DC | PRN

## 2013-09-29 MED ORDER — PIPERACILLIN-TAZOBACTAM 3.375 G IVPB
3.3750 g | Freq: Three times a day (TID) | INTRAVENOUS | Status: DC
  Administered 2013-09-29 – 2013-10-03 (×13): 3.375 g via INTRAVENOUS
  Filled 2013-09-29 (×16): qty 50

## 2013-09-29 MED ORDER — INSULIN ASPART 100 UNIT/ML ~~LOC~~ SOLN
0.0000 [IU] | Freq: Every day | SUBCUTANEOUS | Status: DC
  Administered 2013-09-29 – 2013-09-30 (×2): 3 [IU] via SUBCUTANEOUS
  Administered 2013-10-02: 2 [IU] via SUBCUTANEOUS

## 2013-09-29 MED ORDER — ENOXAPARIN SODIUM 40 MG/0.4ML ~~LOC~~ SOLN
40.0000 mg | SUBCUTANEOUS | Status: DC
Start: 2013-09-29 — End: 2013-09-29
  Administered 2013-09-29: 40 mg via SUBCUTANEOUS
  Filled 2013-09-29: qty 0.4

## 2013-09-29 MED ORDER — VANCOMYCIN HCL 10 G IV SOLR
1500.0000 mg | Freq: Once | INTRAVENOUS | Status: AC
  Administered 2013-09-29: 1500 mg via INTRAVENOUS
  Filled 2013-09-29: qty 1500

## 2013-09-29 MED ORDER — ACETAMINOPHEN 325 MG PO TABS
650.0000 mg | ORAL_TABLET | Freq: Four times a day (QID) | ORAL | Status: DC | PRN
  Administered 2013-09-30: 650 mg via ORAL
  Filled 2013-09-29: qty 2

## 2013-09-29 MED ORDER — ENOXAPARIN SODIUM 60 MG/0.6ML ~~LOC~~ SOLN
60.0000 mg | SUBCUTANEOUS | Status: DC
  Administered 2013-09-30 – 2013-10-04 (×5): 60 mg via SUBCUTANEOUS
  Filled 2013-09-29 (×6): qty 0.6

## 2013-09-29 MED ORDER — INSULIN ASPART 100 UNIT/ML ~~LOC~~ SOLN
10.0000 [IU] | Freq: Three times a day (TID) | SUBCUTANEOUS | Status: DC
  Administered 2013-09-29 (×2): 10 [IU] via SUBCUTANEOUS

## 2013-09-29 MED ORDER — HYDROMORPHONE HCL PF 1 MG/ML IJ SOLN
0.5000 mg | INTRAMUSCULAR | Status: DC | PRN
  Administered 2013-09-29: 1 mg via INTRAVENOUS
  Filled 2013-09-29: qty 1

## 2013-09-29 MED ORDER — LISINOPRIL 10 MG PO TABS
20.0000 mg | ORAL_TABLET | Freq: Every day | ORAL | Status: DC
  Administered 2013-09-29: 20 mg via ORAL
  Filled 2013-09-29: qty 2

## 2013-09-29 MED ORDER — INSULIN ASPART 100 UNIT/ML ~~LOC~~ SOLN
0.0000 [IU] | Freq: Three times a day (TID) | SUBCUTANEOUS | Status: DC
  Administered 2013-09-29: 3 [IU] via SUBCUTANEOUS
  Administered 2013-09-29 (×2): 5 [IU] via SUBCUTANEOUS
  Administered 2013-09-30 – 2013-10-01 (×4): 3 [IU] via SUBCUTANEOUS
  Administered 2013-10-01: 2 [IU] via SUBCUTANEOUS
  Administered 2013-10-01: 3 [IU] via SUBCUTANEOUS
  Administered 2013-10-02: 2 [IU] via SUBCUTANEOUS
  Administered 2013-10-02 (×2): 3 [IU] via SUBCUTANEOUS
  Administered 2013-10-03: 2 [IU] via SUBCUTANEOUS
  Administered 2013-10-03: 1 [IU] via SUBCUTANEOUS
  Administered 2013-10-03 – 2013-10-05 (×4): 2 [IU] via SUBCUTANEOUS
  Administered 2013-10-05: 5 [IU] via SUBCUTANEOUS
  Administered 2013-10-06: 2 [IU] via SUBCUTANEOUS
  Administered 2013-10-06: 3 [IU] via SUBCUTANEOUS
  Administered 2013-10-07: 2 [IU] via SUBCUTANEOUS
  Administered 2013-10-07: 3 [IU] via SUBCUTANEOUS

## 2013-09-29 MED ORDER — VANCOMYCIN HCL 10 G IV SOLR
1250.0000 mg | Freq: Two times a day (BID) | INTRAVENOUS | Status: DC
  Administered 2013-09-29 – 2013-09-30 (×3): 1250 mg via INTRAVENOUS
  Filled 2013-09-29 (×4): qty 1250

## 2013-09-29 MED ORDER — SENNA 8.6 MG PO TABS: 2.0000 | ORAL_TABLET | Freq: Every evening | ORAL | Status: DC | PRN

## 2013-09-29 MED ORDER — SODIUM CHLORIDE 0.9 % IV SOLN
INTRAVENOUS | Status: DC
  Administered 2013-09-29: 1000 mL via INTRAVENOUS

## 2013-09-29 MED ORDER — INSULIN GLARGINE 100 UNIT/ML ~~LOC~~ SOLN
40.0000 [IU] | Freq: Every day | SUBCUTANEOUS | Status: DC
  Administered 2013-09-29: 40 [IU] via SUBCUTANEOUS
  Filled 2013-09-29 (×2): qty 0.4

## 2013-09-29 NOTE — ED Notes (Signed)
Patient ambulatory to restroom  ?

## 2013-09-29 NOTE — H&P (Signed)
[facility] History and Physical  Christopher Raymond ZOX:[id number] DOB: 1976/05/29 DOA: 09/29/2013  Referring physician: EDP PCP: Pcp Not In System  (Dr. Charlann Noss in [place]) Specialists:   Chief Complaint: Increased Pain and Redness and Drainage from Left Foot Wound  HPI: Christopher Raymond is a 38 y.o. male with a history of DM2 on Insulin Rx who presents to the ED with complaints of increased redness, pain and foul smelling drainage from his left foot wound.  The redness has been spreading up his left leg x 1 day. He reports havingn fevers and chills for the past 24 hours as well.   He reports that he has had the wound on his left foot since 05/2013 and has been undergoing wound care by a Dr. Constance Haw in [place].  He states that the wound originally was the size of a Fifty cent piece.     Review of Systems:  Constitutional: No Weight Loss, No Weight Gain, Night Sweats, +Fevers, +Chills, Fatigue, or Generalized Weakness HEENT: No Headaches, Difficulty Swallowing,Tooth/Dental Problems,Sore Throat,  No Sneezing, Rhinitis, Ear Ache, Nasal Congestion, or Post Nasal Drip,  Cardio-vascular:  No Chest pain, Orthopnea, PND, Edema in lower extremities, Anasarca, Dizziness, Palpitations  Resp: No Dyspnea, No DOE, No Cough, No Hemoptysis, No Wheezing.    GI: No Heartburn, Indigestion, Abdominal Pain, Nausea, Vomiting, Diarrhea, Change in Bowel Habits,  Loss of Appetite  GU: No Dysuria, Change in Color of Urine, No Urgency or Frequency.  No flank pain.  Musculoskeletal:  +LLE Pain and Swelling.  No Decreased Range of Motion. +Back Pain.  Neurologic: No Syncope, No Seizures, Muscle Weakness, Paresthesia, Vision Disturbance or Loss, No Diplopia, No Vertigo, No +Difficulty Walking,  Skin: No Rash or Lesions. Psych: No Change in Mood or Affect. No Depression or Anxiety. No Memory loss. No Confusion or Hallucinations   Past Medical History  Diagnosis Date  . GERD (gastroesophageal reflux  disease)   . Diabetes mellitus   . HTN (hypertension)   . Depression   . Anxiety   . Chronic pain   . ED (erectile dysfunction)   . Hyperlipidemia   . Diabetic foot ulcer       Past Surgical History  Procedure Laterality Date  . Knee surgery    . Back surgery    . Cholecystectomy  2009  . Tonsillectomy    . Cholecystectomy open         Prior to Admission medications   Medication Sig Start Date End Date Taking? Authorizing Provider  amoxicillin-clavulanate (AUGMENTIN) 875-125 MG per tablet Take 1 tablet by mouth every 12 (twelve) hours. 07/11/13   Mervin Kung, MD  amoxicillin-clavulanate (AUGMENTIN) 875-125 MG per tablet Take 1 tablet by mouth every 12 (twelve) hours. 08/09/13   Johnna Acosta, MD  escitalopram (LEXAPRO) 10 MG tablet Take 1 tablet (10 mg total) by mouth daily. 10/23/12   Mikey Kirschner, MD  esomeprazole (NEXIUM) 40 MG capsule Take 1 capsule (40 mg total) by mouth daily before breakfast. 03/30/13   Julianne Rice, MD  HYDROcodone-acetaminophen (NORCO/VICODIN) 5-325 MG per tablet Take 1-2 tablets by mouth every 6 (six) hours as needed for moderate pain. 07/11/13   Mervin Kung, MD  insulin aspart protamine- aspart (NOVOLOG MIX 70/30) (70-30) 100 UNIT/ML injection Inject 0.4 mLs (40 Units total) into the skin daily with supper. 06/29/13   Kelvin Cellar, MD  insulin aspart protamine- aspart (NOVOLOG MIX 70/30) (70-30) 100 UNIT/ML injection Inject 0.42 mLs (42 Units total)  into the skin daily with breakfast. 06/29/13   Kelvin Cellar, MD  lisinopril (PRINIVIL,ZESTRIL) 20 MG tablet Take 1 tablet (20 mg total) by mouth daily. 10/23/12   Mikey Kirschner, MD  naproxen (NAPROSYN) 500 MG tablet Take 1 tablet (500 mg total) by mouth 2 (two) times daily with a meal. 08/09/13   Johnna Acosta, MD  oxyCODONE-acetaminophen (PERCOCET) 5-325 MG per tablet Take 1 tablet by mouth every 4 (four) hours as needed. 08/09/13   Johnna Acosta, MD      Allergies  Allergen  Reactions  . Levaquin [Levofloxacin]     Rash on skin, not anaphylaxis     Social History:  reports that he has never smoked. His smokeless tobacco use includes Snuff. He reports that he drinks alcohol. He reports that he does not use illicit drugs.     Family History  Problem Relation Age of Onset  . Coronary artery disease Father   . Heart attack Father   . Hypertension Mother        Physical Exam:  GEN:  Pleasant Obese  38 y.o. Caucasian male  examined  and in no acute distress; cooperative with exam Filed Vitals:   09/28/13 2222  BP: 135/88  Pulse: 133  Temp: 100 F (37.8 C)  TempSrc: Oral  Resp: 18  Height: 6\' 1"  (1.854 m)  Weight: 127.007 kg (280 lb)  SpO2: 100%   Blood pressure 135/88, pulse 133, temperature 100 F (37.8 C), temperature source Oral, resp. rate 18, height 6\' 1"  (1.854 m), weight 127.007 kg (280 lb), SpO2 100.00%. PSYCH:He is alert and oriented x4; does not appear anxious does not appear depressed; affect is normal HEENT: Normocephalic and Atraumatic, Mucous membranes pink; PERRLA; EOM intact; Fundi:  Benign;  No scleral icterus, Nares: Patent, Oropharynx: Clear, Fair Dentition, Neck:  FROM, no cervical lymphadenopathy nor thyromegaly or carotid bruit; no JVD; Breasts:: Not examined CHEST WALL: No tenderness CHEST: Normal respiration, clear to auscultation bilaterally HEART: Regular rate and rhythm; no murmurs rubs or gallops BACK: No kyphosis or scoliosis; no CVA tenderness ABDOMEN: Positive Bowel Sounds, Obese, soft non-tender; no masses, no organomegaly, no pannus; no intertriginous candida. Rectal Exam: Not done EXTREMITIES: No cyanosis, clubbing or edema; +Ulceration on base of 5th MTP Genitalia: not examined PULSES: 2+ and symmetric SKIN: Normal hydration no rash or ulceration CNS:   Vascular: pulses palpable throughout    Labs on Admission:  Basic Metabolic Panel:  Recent Labs Lab 09/29/13 0017 09/29/13 0052  NA 129* 130*  K 3.7  3.7  CL 89* 93*  CO2 26  --   GLUCOSE 452* 437*  BUN 9 8  CREATININE 0.77 0.70  CALCIUM 8.8  --    Liver Function Tests:  Recent Labs Lab 09/29/13 0017  AST 10  ALT 11  ALKPHOS 74  BILITOT 0.6  PROT 7.4  ALBUMIN 3.3*   No results found for this basename: LIPASE, AMYLASE,  in the last 168 hours No results found for this basename: AMMONIA,  in the last 168 hours CBC:  Recent Labs Lab 09/29/13 0017 09/29/13 0052  WBC 9.1  --   HGB 11.9* 13.3  HCT 35.7* 39.0  MCV 81.3  --   PLT 263  --    Cardiac Enzymes: No results found for this basename: CKTOTAL, CKMB, CKMBINDEX, TROPONINI,  in the last 168 hours  BNP (last 3 results) No results found for this basename: PROBNP,  in the last 8760 hours CBG: No results found  for this basename: GLUCAP,  in the last 168 hours  Radiological Exams on Admission: Dg Foot Complete Left  09/29/2013   CLINICAL DATA:  Ulceration at the lateral aspect of the left foot.  EXAM: LEFT FOOT - COMPLETE 3+ VIEW  COMPARISON:  Left foot MRI performed 06/28/2013, and left foot radiographs performed 06/24/2013  FINDINGS: There is no evidence of fracture or dislocation. There is no evidence of osseous erosion. The joint spaces are preserved. There is no evidence of talar subluxation; the subtalar joint is unremarkable in appearance.  Diffuse soft tissue swelling is noted about the foot. Known soft tissue ulceration is not well characterized on radiograph. No radiopaque foreign bodies are seen.  IMPRESSION: No evidence of fracture or dislocation. No evidence of osseous erosion. No radiopaque foreign bodies seen.   Electronically Signed   By: Garald Balding M.D.   On: 09/29/2013 00:49       Assessment/Plan:   38 y.o. male with  Principal Problem:   Cellulitis Active Problems:   Diabetes   Diabetic foot ulcer   Essential hypertension, benign   GERD (gastroesophageal reflux disease)   Chronic low back pain   Hyponatremia   Hyperglycemia     Anemia   1.   Cellulitis-  Of LLE,  IV Vancomycin.    Wound  Care.     2.   Diabetic Foot Ulcer- See #1.   Wound Care.    3.   HTN-     4.   Hyperglycemia/DM2-  Continue Lantus Insulin RX, and SSI Coverage PRN.   Check HbA1c.    5.   Hyponatremia-   IVFs with NSS,  Check Urine Lytes and OSMs.   Monitor Na+Trend.     6.   Chronic Low Back Pain-  Pain Control PRN.     7.   GERD-   Protonix.    8.    DVT Prophylaxis with Lovenox.     9.    Anemia- Check Anemia Panel and FOBT q d x 3.      Code Status:   FULL CODE Family Communication:    No Family Present Disposition Plan:       Inpatient  Time spent:  [facility] Pager [phone]  If 7PM-7AM, please contact night-coverage [email or web address] Password [email or web address] 09/29/2013, 3:30 AM

## 2013-09-29 NOTE — Plan of Care (Signed)
BS was taken but machine did not record for some reason.  259 - 5U Novolog given

## 2013-09-29 NOTE — Progress Notes (Addendum)
[facility] PROGRESS NOTE  Christopher Raymond XVQ:[id number] DOB: May 18, 1976 DOA: 09/29/2013 PCP: Pcp Not In System  Assessment/Plan  Infected diabetic foot ulcer with surrounding cellulitis >> infection was recently suppressed with augmentin -  Diabetes is uncontrolled with last hemoglobin A1c of approximately 11 and 05/2013 -  Continue vancomycin -  Add Zosyn for gram-negative and pseudomonas coverage -  Wound culture -  MRI foot to evaluate for osteomyelitis  Hypertension, blood pressures mildly elevated -  Continue lisinopril  Type 2 diabetes, CBGs in the mid 200s.  Patient's previous total daily insulin requirement was approximately 82 units. He was receiving 70/30, however during hospitalization we will convert to combination long and Cleva Camero acting insulin. -  Followup hemoglobin A1c -  Lantus 40 units daily -  Aspart 10 units with meals plus low-dose sliding scale insulin plus each bedtime insulin  Hyponatremia, likely due to dehydration and resolving with IV fluids -  Repeat BMP in a.m. -  Continue IV fluids  Chronic low back pain, on chronic narcotics -  Discontinue IV narcotics -  Increase when necessary oxycodone closer to home dose -  Restart oxycontin 40mg  BID, verified dose with pharmacy -  Start stool softeners to prevent constipation  GERD, stable, continue Protonix  Normocytic anemia, mild, hemoglobin stable -  Followup iron studies, B12, folate, TSH -  Occult stool  Depression/anxiety, stable, continue SSRI  Diet:  Diabetic  Access:  PIV  IVF:  Yes  Proph:  Lovenox  Code Status: Full  Family Communication: Patient alone  Disposition Plan: pending improvement in foot infection   Consultants:  None  Procedures:  X-ray foot  MRI foot  Antibiotics:  Vancomycin 4/8  Zosyn 4/8   HPI/Subjective:  Feels a little better. He denies fevers, chills. Had some sweats last night.  Objective: Filed Vitals:   09/28/13 2222 09/29/13 0438  BP:  135/88 140/92  Pulse: 133 106  Temp: 100 F (37.8 C) 98.6 F (37 C)  TempSrc: Oral Oral  Resp: 18   Height: 6\' 1"  (1.854 m) 6' (1.829 m)  Weight: 127.007 kg (280 lb) 123 kg (271 lb 2.7 oz)  SpO2: 100% 98%    Intake/Output Summary (Last 24 hours) at 09/29/13 0932 Last data filed at 09/29/13 0600  Gross per 24 hour  Intake 546.67 ml  Output      0 ml  Net 546.67 ml   Filed Weights   09/28/13 2222 09/29/13 0438  Weight: 127.007 kg (280 lb) 123 kg (271 lb 2.7 oz)    Exam:   General:  Obese Caucasian male, No acute distress  HEENT:  NCAT, MMM  Cardiovascular:  RRR, nl S1, S2 no mrg, 2+ pulses, warm extremities  Respiratory:  CTAB, no increased WOB  Abdomen:   NABS, soft, NT/ND  MSK:   Normal tone and bulk, left foot and it, however after bandage removed, dorsum of the foot is swollen, warm to touch, erythematous with some dusky patches along the dorsum of the fifth metatarsal and wrapping around to the small ulcer or located underneath the fifth metatarsal. No obvious foul odor or period and drainage.  Neuro:  Grossly intact  Data Reviewed: Basic Metabolic Panel:  Recent Labs Lab 09/29/13 0017 09/29/13 0052 09/29/13 0544  NA 129* 130* 135*  K 3.7 3.7 3.7  CL 89* 93* 94*  CO2 26  --  28  GLUCOSE 452* 437* 301*  BUN 9 8 9   CREATININE 0.77 0.70 0.71  CALCIUM 8.8  --  8.6   Liver Function Tests:  Recent Labs Lab 09/29/13 0017  AST 10  ALT 11  ALKPHOS 74  BILITOT 0.6  PROT 7.4  ALBUMIN 3.3*   No results found for this basename: LIPASE, AMYLASE,  in the last 168 hours No results found for this basename: AMMONIA,  in the last 168 hours CBC:  Recent Labs Lab 09/29/13 0017 09/29/13 0052 09/29/13 0544  WBC 9.1  --  6.5  HGB 11.9* 13.3 11.1*  HCT 35.7* 39.0 32.5*  MCV 81.3  --  81.5  PLT 263  --  206   Cardiac Enzymes: No results found for this basename: CKTOTAL, CKMB, CKMBINDEX, TROPONINI,  in the last 168 hours BNP (last 3 results) No results  found for this basename: PROBNP,  in the last 8760 hours CBG:  Recent Labs Lab 09/29/13 0513  GLUCAP 269*    No results found for this or any previous visit (from the past 240 hour(s)).   Studies: Dg Foot Complete Left  09/29/2013   CLINICAL DATA:  Ulceration at the lateral aspect of the left foot.  EXAM: LEFT FOOT - COMPLETE 3+ VIEW  COMPARISON:  Left foot MRI performed 06/28/2013, and left foot radiographs performed 06/24/2013  FINDINGS: There is no evidence of fracture or dislocation. There is no evidence of osseous erosion. The joint spaces are preserved. There is no evidence of talar subluxation; the subtalar joint is unremarkable in appearance.  Diffuse soft tissue swelling is noted about the foot. Known soft tissue ulceration is not well characterized on radiograph. No radiopaque foreign bodies are seen.  IMPRESSION: No evidence of fracture or dislocation. No evidence of osseous erosion. No radiopaque foreign bodies seen.   Electronically Signed   By: Garald Balding M.D.   On: 09/29/2013 00:49    Scheduled Meds: . docusate sodium  100 mg Oral BID  . enoxaparin (LOVENOX) injection  40 mg Subcutaneous Q24H  . escitalopram  10 mg Oral Daily  . insulin aspart  0-5 Units Subcutaneous QHS  . insulin aspart  0-9 Units Subcutaneous TID WC  . insulin aspart  10 Units Subcutaneous TID WC  . insulin glargine  40 Units Subcutaneous Daily  . lisinopril  20 mg Oral Daily  . pantoprazole  40 mg Oral Daily  . piperacillin-tazobactam (ZOSYN)  IV  3.375 g Intravenous Q8H   Continuous Infusions: . sodium chloride 100 mL/hr at 09/29/13 0600    Principal Problem:   Cellulitis Active Problems:   GERD (gastroesophageal reflux disease)   Essential hypertension, benign   Diabetes   Chronic low back pain   Diabetic foot ulcer   Hyponatremia   Hyperglycemia    Time spent: 30 min    [facility] Pager [phone]. If 7PM-7AM, please contact night-coverage at  [email or web address], password [facility] 09/29/2013, 8:38 AM  LOS: 0 days

## 2013-09-29 NOTE — Plan of Care (Signed)
Pt vommitted while down to get MRI.  Pt requested to be re-scheduled for later.

## 2013-09-29 NOTE — Progress Notes (Signed)
ANTIBIOTIC CONSULT NOTE- follow up  Pharmacy Consult for Vancomycin and Zosyn Indication: Cellulitis (r/o osteomyelitis)  Allergies  Allergen Reactions  . Levaquin [Levofloxacin] Rash   Patient Measurements: Height: 6' (182.9 cm) Weight: 271 lb 2.7 oz (123 kg) IBW/kg (Calculated) : 77.6  Vital Signs: Temp: 98.6 F (37 C) (04/08 0438) Temp src: Oral (04/08 0438) BP: 138/90 mmHg (04/08 0913) Pulse Rate: 106 (04/08 0438)  Labs:  Recent Labs  09/29/13 0017 09/29/13 0052 09/29/13 0544  WBC 9.1  --  6.5  HGB 11.9* 13.3 11.1*  PLT 263  --  206  CREATININE 0.77 0.70 0.71   Estimated Creatinine Clearance: 171.3 ml/min (by C-G formula based on Cr of 0.71).  No results found for this basename: VANCOTROUGH, VANCOPEAK, VANCORANDOM, GENTTROUGH, GENTPEAK, GENTRANDOM, TOBRATROUGH, TOBRAPEAK, TOBRARND, AMIKACINPEAK, AMIKACINTROU, AMIKACIN,  in the last 72 hours   Microbiology: No results found for this or any previous visit (from the past 720 hour(s)).  Medical History: Past Medical History  Diagnosis Date  . GERD (gastroesophageal reflux disease)   . Diabetes mellitus   . HTN (hypertension)   . Depression   . Anxiety   . Chronic pain   . ED (erectile dysfunction)   . Hyperlipidemia   . Diabetic foot ulcer    Medications:   Vancomycin 1 Gm IV given in the ED at @1am  4/8  Assessment: 38 yo diabetic male with LLE foot ulcer since 05/2013 receiving outpatient wound care; now with foul smelling  drainage, increased pain, and with redness extending to upper leg x 1 day. Pt reports fever and chills x 24hrs.  MRI pending to r/o osteomyelitis.  Preliminary review of pertinent patient information completed.  Protocol will be initiated with a one-time dose of Vancomycin 1500 mg IV in addition to the 1 Gm dose given earlier in the ED, for a total dose of 2500 mg.  Forestine Na clinical pharmacist will complete review during morning rounds to assess patient and finalize treatment  regimen.  Goal of Therapy:  Vancomycin troughs 10-15 mcg/ml (15-20 for osteo)  Plan:   Vancomycin 1250mg  IV q12hrs starting tonight  Check trough at steady state  Zosyn 3.375gm IV q8h, each dose over 4 hrs  F/U MRI evaluation for possible osteomyelitis  Monitor labs, renal fxn, and cultures  Ena Dawley, [facility] 09/29/2013,12:02 PM

## 2013-09-29 NOTE — ED Notes (Signed)
Pt in wheelchair to the bathroom

## 2013-09-29 NOTE — Progress Notes (Signed)
ANTIBIOTIC CONSULT NOTE-Preliminary  Pharmacy Consult for Vancomycin Indication: Cellulitis  Allergies  Allergen Reactions  . Levaquin [Levofloxacin]     Rash on skin, not anaphylaxis    Patient Measurements: Height: 6\' 1"  (185.4 cm) Weight: 280 lb (127.007 kg) IBW/kg (Calculated) : 79.9  Vital Signs: Temp: 100 F (37.8 C) (04/07 2222) Temp src: Oral (04/07 2222) BP: 135/88 mmHg (04/07 2222) Pulse Rate: 133 (04/07 2222)  Labs:  Recent Labs  09/29/13 0017 09/29/13 0052  WBC 9.1  --   HGB 11.9* 13.3  PLT 263  --   CREATININE 0.77 0.70    Estimated Creatinine Clearance: 176.5 ml/min (by C-G formula based on Cr of 0.7).  No results found for this basename: VANCOTROUGH, VANCOPEAK, VANCORANDOM, GENTTROUGH, GENTPEAK, GENTRANDOM, TOBRATROUGH, TOBRAPEAK, TOBRARND, AMIKACINPEAK, AMIKACINTROU, AMIKACIN,  in the last 72 hours   Microbiology: No results found for this or any previous visit (from the past 720 hour(s)).  Medical History: Past Medical History  Diagnosis Date  . GERD (gastroesophageal reflux disease)   . Diabetes mellitus   . HTN (hypertension)   . Depression   . Anxiety   . Chronic pain   . ED (erectile dysfunction)   . Hyperlipidemia   . Diabetic foot ulcer     Medications:   Vancomycin 1 Gm IV given in the ED at @1am   Assessment: 38 yo diabetic male with LLE foot ulcer since 05/2013 receiving outpatient wound care; now with foul smelling  drainage, increased pain, and with redness extending to upper leg x 1 day. Pt reports fever and chills x 24hrs.  Goal of Therapy:  Vancomycin troughs 10-15 mcg/ml  Plan:  Preliminary review of pertinent patient information completed.  Protocol will be initiated with a one-time dose of Vancomycin 1500 mg IV in addition to the 1 Gm dose given earlier in the ED, for a total dose of 2500 mg.  Forestine Na clinical pharmacist will complete review during morning rounds to assess patient and finalize treatment  regimen.  Norberto Sorenson, [facility] 09/29/2013,4:20 AM

## 2013-09-29 NOTE — Progress Notes (Signed)
Inpatient Diabetes Program Recommendations  AACE/ADA: New Consensus Statement on Inpatient Glycemic Control (2013)  Target Ranges:  Prepandial:   less than 140 mg/dL      Peak postprandial:   less than 180 mg/dL (1-2 hours)      Critically ill patients:  140 - 180 mg/dL   Results for VERLON, PISCHKE (MRN [id number]) as of 09/29/2013 08:14  Ref. Range 09/29/2013 00:17 09/29/2013 00:52 09/29/2013 05:44  Glucose Latest Range: 70-99 mg/dL 452 (H) 437 (H) 301 (H)    Diabetes history: DM2 Outpatient Diabetes medications: Novolog 70/30 42 units with breakfast, Novolog 70/30 40 units with supper Current orders for Inpatient glycemic control: Novolog 70/30 40 units with supper, Novolog 0-9 units AC, Novolog 0-5 units HS  Inpatient Diabetes Program Recommendations Insulin - Basal: Please order am dose of 70/30. According to the home medication list, patient takes 70/30 42 units with breakfast as an outpatient.  Thanks, Barnie Alderman, RN, MSN, CCRN Diabetes Coordinator Inpatient Diabetes Program [phone] (Team Pager) [phone] ([place] office) [phone] [facility] office)

## 2013-09-29 NOTE — Progress Notes (Signed)
UR chart review completed.  

## 2013-09-29 NOTE — Plan of Care (Signed)
Problem: Consults Goal: Cellulitis Patient Education See Patient Education Module for education specifics.  Outcome: Progressing Handout given and risks discussed Goal: Diabetes Guidelines if Diabetic/Glucose > 140 If diabetic or lab glucose is > 140 mg/dl - Initiate Diabetes/Hyperglycemia Guidelines & Document Interventions  Outcome: Progressing Blood sugar 296 on admission, coverage given per MD  Problem: Phase I Progression Outcomes Goal: Pain controlled with appropriate interventions Outcome: Progressing Patient able to tolerate pain score of 6, Goal: Initial discharge plan identified Outcome: Progressing To home Goal: Voiding-avoid urinary catheter unless indicated Outcome: Completed/Met Date Met:  09/29/13 completed Goal: Wound assessment- dressing change as appropriate Outcome: Completed/Met Date Met:  09/29/13 Dressed wound with wet-dry dressing,

## 2013-09-29 NOTE — Plan of Care (Signed)
BS checked, but did not transfer to chart.  BS was 223 - pt given 3U + 10U base as ordered.

## 2013-09-30 LAB — BASIC METABOLIC PANEL
BUN: 10 mg/dL (ref 6–23)
CALCIUM: 8.1 mg/dL — AB (ref 8.4–10.5)
CO2: 28 mEq/L (ref 19–32)
Chloride: 96 mEq/L (ref 96–112)
Creatinine, Ser: 0.6 mg/dL (ref 0.50–1.35)
GFR calc Af Amer: >90 mL/min (ref >90)
GFR calc non Af Amer: >90 mL/min (ref >90)
GLUCOSE: 241 mg/dL — AB (ref 70–99)
Potassium: 3.4 mEq/L — ABNORMAL LOW (ref 3.7–5.3)
SODIUM: 134 meq/L — AB (ref 137–147)

## 2013-09-30 LAB — GLUCOSE, CAPILLARY
Glucose-Capillary: 206 mg/dL — ABNORMAL HIGH (ref 70–99)
Glucose-Capillary: 215 mg/dL — ABNORMAL HIGH (ref 70–99)
Glucose-Capillary: 258 mg/dL — ABNORMAL HIGH (ref 70–99)

## 2013-09-30 LAB — CBC
HCT: 30.6 % — ABNORMAL LOW (ref 39.0–52.0)
HEMOGLOBIN: 10.5 g/dL — AB (ref 13.0–17.0)
MCH: 27.8 pg (ref 26.0–34.0)
MCHC: 34.3 g/dL (ref 30.0–36.0)
MCV: 81 fL (ref 78.0–100.0)
PLATELETS: 212 10*3/uL (ref 150–400)
RBC: 3.78 MIL/uL — AB (ref 4.22–5.81)
RDW: 12.6 % (ref 11.5–15.5)
WBC: 5.3 10*3/uL (ref 4.0–10.5)

## 2013-09-30 LAB — C-REACTIVE PROTEIN: CRP: 16.8 mg/dL — AB (ref <0.60)

## 2013-09-30 LAB — SEDIMENTATION RATE: SED RATE: 60 mm/h — AB (ref 0–16)

## 2013-09-30 MED ORDER — INSULIN ASPART 100 UNIT/ML ~~LOC~~ SOLN
12.0000 [IU] | Freq: Three times a day (TID) | SUBCUTANEOUS | Status: DC
  Administered 2013-09-30 (×3): 12 [IU] via SUBCUTANEOUS

## 2013-09-30 MED ORDER — LISINOPRIL 10 MG PO TABS
40.0000 mg | ORAL_TABLET | Freq: Every day | ORAL | Status: DC
  Administered 2013-09-30 – 2013-10-08 (×9): 40 mg via ORAL
  Filled 2013-09-30 (×9): qty 4

## 2013-09-30 MED ORDER — INSULIN GLARGINE 100 UNIT/ML ~~LOC~~ SOLN
50.0000 [IU] | Freq: Every day | SUBCUTANEOUS | Status: DC
  Administered 2013-09-30: 50 [IU] via SUBCUTANEOUS
  Filled 2013-09-30 (×2): qty 0.5

## 2013-09-30 MED ORDER — INSULIN GLARGINE 100 UNIT/ML ~~LOC~~ SOLN
45.0000 [IU] | Freq: Every day | SUBCUTANEOUS | Status: DC
  Filled 2013-09-30: qty 0.45

## 2013-09-30 MED ORDER — POTASSIUM CHLORIDE CRYS ER 20 MEQ PO TBCR
40.0000 meq | EXTENDED_RELEASE_TABLET | Freq: Once | ORAL | Status: AC
  Administered 2013-09-30: 40 meq via ORAL
  Filled 2013-09-30: qty 2

## 2013-09-30 NOTE — Consult Note (Signed)
Reason for Consult: diabetes with osteomyelitis Referring Physician: Jana Hakim   Christopher Raymond is an 38 y.o. male.  HPI:  Author: Theressa Millard, MD Service: Internal Medicine Author Type: Physician     Filed: 09/29/2013  3:47 AM Note Time: 09/29/2013  3:30 AM Status: Signed    Editor: Theressa Millard, MD (Physician)          PCP: Pcp Not In System  (Dr. Charlann Noss in [place]) Specialists:   Chief Complaint: Increased Pain and Redness and Drainage from Left Foot Wound  HPI: Christopher Raymond is a 38 y.o. male with a history of DM2 on Insulin Rx who presents to the ED with complaints of increased redness, pain and foul smelling drainage from his left foot wound.  The redness has been spreading up his left leg x 1 day. He reports havingn fevers and chills for the past 24 hours as well.   He reports that he has had the wound on his left foot since 05/2013 and has been undergoing wound care by a Dr. Constance Haw in [place].  He states that the wound originally was the size of a Fifty cent piece.     Review of Systems:  Constitutional: No Weight Loss, No Weight Gain, Night Sweats, +Fevers, +Chills, Fatigue, or Generalized Weakness HEENT: No Headaches, Difficulty Swallowing,Tooth/Dental Problems,Sore Throat,  No Sneezing, Rhinitis, Ear Ache, Nasal Congestion, or Post Nasal Drip,   Cardio-vascular:  No Chest pain, Orthopnea, PND, Edema in lower extremities, Anasarca, Dizziness, Palpitations  Resp: No Dyspnea, No DOE, No Cough, No Hemoptysis, No Wheezing.     GI: No Heartburn, Indigestion, Abdominal Pain, Nausea, Vomiting, Diarrhea, Change in Bowel Habits,  Loss of Appetite   GU: No Dysuria, Change in Color of Urine, No Urgency or Frequency.  No flank pain.  Musculoskeletal:  +LLE Pain and Swelling.  No Decreased Range of Motion. +Back Pain.   Neurologic: No Syncope, No Seizures, Muscle Weakness, Paresthesia, Vision Disturbance or Loss, No Diplopia, No Vertigo, No +Difficulty  Walking,  Skin: No Rash or Lesions. Psych: No Change in Mood or Affect. No Depression or Anxiety. No Memory loss. No Confusion or Hallucinations     Past Medical History  Diagnosis Date  . GERD (gastroesophageal reflux disease)   . Diabetes mellitus   . HTN (hypertension)   . Depression   . Anxiety   . Chronic pain   . ED (erectile dysfunction)   . Hyperlipidemia   . Diabetic foot ulcer     Past Surgical History  Procedure Laterality Date  . Knee surgery    . Back surgery    . Cholecystectomy  2009  . Tonsillectomy    . Cholecystectomy open      Family History  Problem Relation Age of Onset  . Coronary artery disease Father   . Heart attack Father   . Hypertension Mother     Social History:  reports that he has never smoked. His smokeless tobacco use includes Snuff. He reports that he drinks alcohol. He reports that he does not use illicit drugs.  Allergies:  Allergies  Allergen Reactions  . Levaquin [Levofloxacin] Rash    Medications: I have reviewed the patient's current medications. Labs  BMET    Component Value Date/Time   NA 134* 09/30/2013 0500   K 3.4* 09/30/2013 0500   CL 96 09/30/2013 0500   CO2 28 09/30/2013 0500   GLUCOSE 241* 09/30/2013 0500   BUN 10 09/30/2013 0500   CREATININE  0.60 09/30/2013 0500   CALCIUM 8.1* 09/30/2013 0500   GFRNONAA >90 09/30/2013 0500   GFRAA >90 09/30/2013 0500    CBC    Component Value Date/Time   WBC 5.3 09/30/2013 0500   RBC 3.78* 09/30/2013 0500   HGB 10.5* 09/30/2013 0500   HCT 30.6* 09/30/2013 0500   PLT 212 09/30/2013 0500   MCV 81.0 09/30/2013 0500   MCH 27.8 09/30/2013 0500   MCHC 34.3 09/30/2013 0500   RDW 12.6 09/30/2013 0500   LYMPHSABS 1.3 07/11/2013 1713   MONOABS 0.2 07/11/2013 1713   EOSABS 0.0 07/11/2013 1713   BASOSABS 0.0 07/11/2013 1713    ESR 60   Mr Foot Left W Wo Contrast  09/29/2013   CLINICAL DATA:  Open wound along the lateral aspect of the left foot.  EXAM: MRI OF THE LEFT FOREFOOT WITHOUT AND WITH CONTRAST   TECHNIQUE: Multiplanar, multisequence MR imaging was performed both before and after administration of intravenous contrast.  CONTRAST:  63m MULTIHANCE GADOBENATE DIMEGLUMINE 529 MG/ML IV SOLN  COMPARISON:  None.  FINDINGS: There is a soft tissue ulcer along the lateral aspect of the left forefoot at the level of the fifth metatarsal head. There is marrow edema within the fifth metatarsal head with corresponding T1 hypointense signal and cortical irregularity along the plantar aspect of the fifth metatarsal head with enhancement. There is a sinus tract extending from the knee plantar surface of the lateral forefoot to the fifth metatarsal head. There is no focal fluid collection to suggest an abscess. There is mild marrow edema within the base of the fifth proximal phalanx without cortical irregularity likely reactive.  The remainder of the osseous structures are normal. No other focal marrow signal abnormality. No joint effusions. The flexor, extensor and peroneal tendons are intact.  IMPRESSION: There is a soft tissue ulcer along the lateral aspect of the left forefoot at the level of the fifth metatarsal head. There is marrow edema within the fifth metatarsal head with corresponding T1 hypointense signal and cortical irregularity along the plantar aspect of the fifth metatarsal head with enhancement. The appearance is concerning for osteomyelitis. There is a sinus tract extending from the skin surface to the fifth metatarsal head.   Electronically Signed   By: HKathreen Devoid  On: 09/29/2013 13:02   Dg Foot Complete Left  09/29/2013   CLINICAL DATA:  Ulceration at the lateral aspect of the left foot.  EXAM: LEFT FOOT - COMPLETE 3+ VIEW  COMPARISON:  Left foot MRI performed 06/28/2013, and left foot radiographs performed 06/24/2013  FINDINGS: There is no evidence of fracture or dislocation. There is no evidence of osseous erosion. The joint spaces are preserved. There is no evidence of talar subluxation; the  subtalar joint is unremarkable in appearance.  Diffuse soft tissue swelling is noted about the foot. Known soft tissue ulceration is not well characterized on radiograph. No radiopaque foreign bodies are seen.  IMPRESSION: No evidence of fracture or dislocation. No evidence of osseous erosion. No radiopaque foreign bodies seen.   Electronically Signed   By: JGarald BaldingM.D.   On: 09/29/2013 00:49    ROS Blood pressure 148/87, pulse 96, temperature 98.1 F (36.7 C), temperature source Oral, resp. rate 12, height 6' (1.829 m), weight 123 kg (271 lb 2.7 oz), SpO2 96.00%. Physical Exam  General the patient is well-developed, obese Oriented x3 Mood and affect normal Ambulation did not assess  Inspection of the left foot reveals a plantar based  small wound with purulent drainage, erythema along the dorsum of the foot, with tenderness over the 5th metatarsal head there is some erythema in the distal tibial soft tissue area.  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

## 2013-09-30 NOTE — Progress Notes (Signed)
[facility] PROGRESS NOTE  Christopher Raymond OIT:[id number] DOB: 01-14-1976 DOA: 09/29/2013 PCP: Pcp Not In System  Assessment/Plan  Infected diabetic foot ulcer with surrounding cellulitis >> infection was recently suppressed with augmentin -  Diabetes is uncontrolled with last hemoglobin A1c of approximately 11 and 05/2013 -  Continue vancomycin and zosyn day 2 -  Wound culture:  GNR and GPC in pairs and clusters -  MRI foot:  + osteomyelitis -  Orthopedics consultation  -  ESR 60, CRP pending  Hypertension, blood pressures mildly elevated -  Increase lisinopril -  Consider adding second agent  Type 2 diabetes, CBGs in the mid 200s.  Patient's previous total daily insulin requirement was approximately 82 units. States prior to this admission, his CBGs were all in the low 100s, however, his A1c is very elevated.  Increase by approximately 25%. -  Hemoglobin A1c 12.1 -  Increase Lantus 50 units daily -  Increase aspart 12 units with meals plus low-dose sliding scale insulin plus each bedtime insulin  Hyponatremia, likely due to dehydration and resolving with IV fluids -  Repeat BMP in a.m. -  Continue IV fluids  Chronic low back pain, on chronic narcotics -  Continue chronic narcotics, oxycontin 24m BID, verified dose with pharmacy, and oxycodone 139mq4h prn -  Continue stool softeners to prevent constipation  GERD, stable, continue Protonix  Normocytic anemia, mild, hemoglobin stable -  Followup iron studies, B12, folate, TSH -  Occult stool  Depression/anxiety, stable, continue SSRI  Diet:  Diabetic  Access:  PIV  IVF:  Yes  Proph:  Lovenox  Code Status: Full  Family Communication: Patient alone  Disposition Plan: pending improvement in foot infection, ortho consult   Consultants:  None  Procedures:  X-ray foot  MRI foot  Antibiotics:  Vancomycin 4/8  Zosyn 4/8   HPI/Subjective:  Foot feels better, less painful and tense.  He denies fevers,  chills.   Objective: Filed Vitals:   09/29/13 2200 09/30/13 0235 09/30/13 0608 09/30/13 0632  BP: 158/92 133/94 161/102 157/90  Pulse: 100 94 96   Temp: 99.7 F (37.6 C) 98.6 F (37 C) 98.1 F (36.7 C)   TempSrc: Oral Oral Oral   Resp: 21 14 12    Height:      Weight:      SpO2: 97% 97% 96%     Intake/Output Summary (Last 24 hours) at 09/30/13 0823 Last data filed at 09/30/13 0600  Gross per 24 hour  Intake 2513.33 ml  Output      0 ml  Net 2513.33 ml   Filed Weights   09/28/13 2222 09/29/13 0438  Weight: 127.007 kg (280 lb) 123 kg (271 lb 2.7 oz)    Exam:   General:  Obese Caucasian male, No acute distress  HEENT:  NCAT, MMM  Cardiovascular:  RRR, nl S1, S2 no mrg, 2+ pulses, warm extremities  Respiratory:  CTAB, no increased WOB  Abdomen:   NABS, soft, NT/ND  MSK:   Normal tone and bulk, left foot dorsum of the foot is less swollen, warm to touch, and erythematous.  Persistent dusky patches along the dorsum of the fifth metatarsal and wrapping around to the small ulcer or located underneath the fifth metatarsal. + foul odor, clear drainage  Neuro:  Grossly intact  Data Reviewed: Basic Metabolic Panel:  Recent Labs Lab 09/29/13 0017 09/29/13 0052 09/29/13 0544 09/30/13 0500  NA 129* 130* 135* 134*  K 3.7 3.7 3.7 3.4*  CL 89*  93* 94* 96  CO2 26  --  28 28  GLUCOSE 452* 437* 301* 241*  BUN 9 8 9 10   CREATININE 0.77 0.70 0.71 0.60  CALCIUM 8.8  --  8.6 8.1*   Liver Function Tests:  Recent Labs Lab 09/29/13 0017  AST 10  ALT 11  ALKPHOS 74  BILITOT 0.6  PROT 7.4  ALBUMIN 3.3*   No results found for this basename: LIPASE, AMYLASE,  in the last 168 hours No results found for this basename: AMMONIA,  in the last 168 hours CBC:  Recent Labs Lab 09/29/13 0017 09/29/13 0052 09/29/13 0544 09/30/13 0500  WBC 9.1  --  6.5 5.3  HGB 11.9* 13.3 11.1* 10.5*  HCT 35.7* 39.0 32.5* 30.6*  MCV 81.3  --  81.5 81.0  PLT 263  --  206 212   Cardiac  Enzymes: No results found for this basename: CKTOTAL, CKMB, CKMBINDEX, TROPONINI,  in the last 168 hours BNP (last 3 results) No results found for this basename: PROBNP,  in the last 8760 hours CBG:  Recent Labs Lab 09/29/13 0513 09/29/13 1646 09/29/13 2050  GLUCAP 269* 255* 256*    Recent Results (from the past 240 hour(s))  WOUND CULTURE     Status: None   Collection Time    09/29/13  9:30 AM      Result Value Ref Range Status   Specimen Description FOOT   Final   Special Requests Immunocompromised   Final   Gram Stain     Final   Value: NO WBC SEEN     NO SQUAMOUS EPITHELIAL CELLS SEEN     ABUNDANT GRAM NEGATIVE RODS     ABUNDANT GRAM POSITIVE COCCI     IN PAIRS IN CLUSTERS   Culture PENDING   Incomplete   Report Status PENDING   Incomplete     Studies: Mr Foot Left W Wo Contrast  09/29/2013   CLINICAL DATA:  Open wound along the lateral aspect of the left foot.  EXAM: MRI OF THE LEFT FOREFOOT WITHOUT AND WITH CONTRAST  TECHNIQUE: Multiplanar, multisequence MR imaging was performed both before and after administration of intravenous contrast.  CONTRAST:  61m MULTIHANCE GADOBENATE DIMEGLUMINE 529 MG/ML IV SOLN  COMPARISON:  None.  FINDINGS: There is a soft tissue ulcer along the lateral aspect of the left forefoot at the level of the fifth metatarsal head. There is marrow edema within the fifth metatarsal head with corresponding T1 hypointense signal and cortical irregularity along the plantar aspect of the fifth metatarsal head with enhancement. There is a sinus tract extending from the knee plantar surface of the lateral forefoot to the fifth metatarsal head. There is no focal fluid collection to suggest an abscess. There is mild marrow edema within the base of the fifth proximal phalanx without cortical irregularity likely reactive.  The remainder of the osseous structures are normal. No other focal marrow signal abnormality. No joint effusions. The flexor, extensor and peroneal  tendons are intact.  IMPRESSION: There is a soft tissue ulcer along the lateral aspect of the left forefoot at the level of the fifth metatarsal head. There is marrow edema within the fifth metatarsal head with corresponding T1 hypointense signal and cortical irregularity along the plantar aspect of the fifth metatarsal head with enhancement. The appearance is concerning for osteomyelitis. There is a sinus tract extending from the skin surface to the fifth metatarsal head.   Electronically Signed   By: HKathreen Devoid  On:  09/29/2013 13:02   Dg Foot Complete Left  09/29/2013   CLINICAL DATA:  Ulceration at the lateral aspect of the left foot.  EXAM: LEFT FOOT - COMPLETE 3+ VIEW  COMPARISON:  Left foot MRI performed 06/28/2013, and left foot radiographs performed 06/24/2013  FINDINGS: There is no evidence of fracture or dislocation. There is no evidence of osseous erosion. The joint spaces are preserved. There is no evidence of talar subluxation; the subtalar joint is unremarkable in appearance.  Diffuse soft tissue swelling is noted about the foot. Known soft tissue ulceration is not well characterized on radiograph. No radiopaque foreign bodies are seen.  IMPRESSION: No evidence of fracture or dislocation. No evidence of osseous erosion. No radiopaque foreign bodies seen.   Electronically Signed   By: Garald Balding M.D.   On: 09/29/2013 00:49    Scheduled Meds: . docusate sodium  100 mg Oral BID  . enoxaparin (LOVENOX) injection  60 mg Subcutaneous Q24H  . escitalopram  10 mg Oral Daily  . insulin aspart  0-5 Units Subcutaneous QHS  . insulin aspart  0-9 Units Subcutaneous TID WC  . insulin aspart  12 Units Subcutaneous TID WC  . insulin glargine  45 Units Subcutaneous Daily  . lisinopril  20 mg Oral Daily  . OxyCODONE  40 mg Oral Q12H  . pantoprazole  40 mg Oral Daily  . piperacillin-tazobactam (ZOSYN)  IV  3.375 g Intravenous Q8H  . potassium chloride  40 mEq Oral Once  . vancomycin  1,250 mg  Intravenous Q12H   Continuous Infusions: . sodium chloride 75 mL/hr at 09/29/13 2595    Principal Problem:   Cellulitis Active Problems:   GERD (gastroesophageal reflux disease)   Essential hypertension, benign   Diabetes   Chronic low back pain   Diabetic foot ulcer   Hyponatremia   Hyperglycemia    Time spent: 30 min    [facility] Pager [phone]. If 7PM-7AM, please contact night-coverage at [email or web address], password [facility] 09/30/2013, 8:23 AM  LOS: 1 day

## 2013-10-01 LAB — GLUCOSE, CAPILLARY
GLUCOSE-CAPILLARY: 230 mg/dL — AB (ref 70–99)
GLUCOSE-CAPILLARY: 166 mg/dL — AB (ref 70–99)
Glucose-Capillary: 216 mg/dL — ABNORMAL HIGH (ref 70–99)
Glucose-Capillary: 193 mg/dL — ABNORMAL HIGH (ref 70–99)

## 2013-10-01 LAB — FERRITIN: Ferritin: 226 ng/mL (ref 22–322)

## 2013-10-01 LAB — CBC
HCT: 30.9 % — ABNORMAL LOW (ref 39.0–52.0)
Hemoglobin: 10.5 g/dL — ABNORMAL LOW (ref 13.0–17.0)
MCH: 27.5 pg (ref 26.0–34.0)
MCHC: 34 g/dL (ref 30.0–36.0)
MCV: 80.9 fL (ref 78.0–100.0)
Platelets: 264 10*3/uL (ref 150–400)
RBC: 3.82 MIL/uL — ABNORMAL LOW (ref 4.22–5.81)
RDW: 12.6 % (ref 11.5–15.5)
WBC: 5.7 10*3/uL (ref 4.0–10.5)

## 2013-10-01 LAB — BASIC METABOLIC PANEL
BUN: 6 mg/dL (ref 6–23)
CO2: 29 meq/L (ref 19–32)
Calcium: 8.5 mg/dL (ref 8.4–10.5)
Chloride: 97 mEq/L (ref 96–112)
Creatinine, Ser: 0.64 mg/dL (ref 0.50–1.35)
GFR calc Af Amer: >90 mL/min (ref >90)
GFR calc non Af Amer: >90 mL/min (ref >90)
GLUCOSE: 199 mg/dL — AB (ref 70–99)
POTASSIUM: 3.5 meq/L — AB (ref 3.7–5.3)
Sodium: 135 mEq/L — ABNORMAL LOW (ref 137–147)

## 2013-10-01 LAB — TSH: TSH: 2 u[IU]/mL (ref 0.350–4.500)

## 2013-10-01 LAB — IRON AND TIBC
Iron: 10 ug/dL — ABNORMAL LOW (ref 42–135)
Saturation Ratios: 5 % — ABNORMAL LOW (ref 20–55)
TIBC: 196 ug/dL — ABNORMAL LOW (ref 215–435)
UIBC: 186 ug/dL (ref 125–400)

## 2013-10-01 LAB — OCCULT BLOOD X 1 CARD TO LAB, STOOL: Fecal Occult Bld: NEGATIVE

## 2013-10-01 LAB — VANCOMYCIN, TROUGH: Vancomycin Tr: <5 ug/mL — ABNORMAL LOW (ref 10.0–20.0)

## 2013-10-01 LAB — VITAMIN B12: VITAMIN B 12: 475 pg/mL (ref 211–911)

## 2013-10-01 LAB — TRANSFERRIN: TRANSFERRIN: 155 mg/dL — AB (ref 200–360)

## 2013-10-01 MED ORDER — VANCOMYCIN HCL 10 G IV SOLR
1250.0000 mg | Freq: Once | INTRAVENOUS | Status: AC
  Administered 2013-10-01: 1250 mg via INTRAVENOUS
  Filled 2013-10-01: qty 1250

## 2013-10-01 MED ORDER — VANCOMYCIN HCL IN DEXTROSE 1-5 GM/200ML-% IV SOLN
1000.0000 mg | Freq: Three times a day (TID) | INTRAVENOUS | Status: DC
  Administered 2013-10-01 – 2013-10-03 (×6): 1000 mg via INTRAVENOUS
  Filled 2013-10-01 (×10): qty 200

## 2013-10-01 MED ORDER — INSULIN ASPART 100 UNIT/ML ~~LOC~~ SOLN
15.0000 [IU] | Freq: Three times a day (TID) | SUBCUTANEOUS | Status: DC
  Administered 2013-10-01 – 2013-10-02 (×6): 15 [IU] via SUBCUTANEOUS

## 2013-10-01 MED ORDER — CARVEDILOL 3.125 MG PO TABS
6.2500 mg | ORAL_TABLET | Freq: Two times a day (BID) | ORAL | Status: DC
  Administered 2013-10-01 – 2013-10-03 (×4): 6.25 mg via ORAL
  Filled 2013-10-01 (×4): qty 2

## 2013-10-01 MED ORDER — OXYCODONE HCL 5 MG PO TABS
15.0000 mg | ORAL_TABLET | ORAL | Status: DC | PRN
  Administered 2013-10-01 – 2013-10-08 (×37): 15 mg via ORAL
  Filled 2013-10-01 (×37): qty 3

## 2013-10-01 MED ORDER — INSULIN GLARGINE 100 UNIT/ML ~~LOC~~ SOLN
60.0000 [IU] | Freq: Every day | SUBCUTANEOUS | Status: DC
  Administered 2013-10-01 – 2013-10-02 (×2): 60 [IU] via SUBCUTANEOUS
  Filled 2013-10-01 (×4): qty 0.6

## 2013-10-01 NOTE — Care Management Note (Addendum)
    Page 1 of 1   10/04/2013     2:35:24 PM   CARE MANAGEMENT NOTE 10/04/2013  Patient:  Christopher Raymond, Christopher Raymond   Account Number:  [id number]  Date Initiated:  10/01/2013  Documentation initiated by:  Theophilus Kinds  Subjective/Objective Assessment:   Pt admitted from home with cellulitis and osteomylitis. Pt lives with his girlfriend and his mother. Pt is very independent with ADL's and will return home at discharge.     Action/Plan:   WIll follow for any discharge planning needs. Pt may need IV AB at discharge.   Anticipated DC Date:  10/04/2013   Anticipated DC Plan:  [place]  CM consult      Choice offered to / List presented to:             Status of service:  Completed, signed off Medicare Important Message given?   (If response is "NO", the following Medicare IM given date fields will be blank) Date Medicare IM given:   Date Additional Medicare IM given:    Discharge Disposition:    Per UR Regulation:    If discussed at Long Length of Stay Meetings, dates discussed:    Comments:  10/04/13 Claretha Cooper RN BSN CM Pt for debridement and may need to look at SNF for LT AB.  10/01/13 [place], RN BSN CM

## 2013-10-01 NOTE — Progress Notes (Signed)
[facility] PROGRESS NOTE  Christopher Raymond WUX:[id number] DOB: 1975/09/10 DOA: 09/29/2013 PCP: Pcp Not In System  Assessment/Plan  Infected diabetic foot ulcer with surrounding cellulitis >> infection was recently suppressed with augmentin -  Diabetes is uncontrolled with last hemoglobin A1c of approximately 11 and 05/2013 -  Continue vancomycin and zosyn day 3 -  Wound culture:  Prelim - abundant group B strep -  MRI foot:  + osteomyelitis -  General surgery consultation  -  ESR 60, CRP 16.8  Hypertension, blood pressures trending down somewhat  -  Continue lisinopril -  Add carvedilol   Type 2 diabetes, CBGs in the mid 200s.  Patient's previous total daily insulin requirement was approximately 82 units. States prior to this admission, his CBGs were all in the low 100s, however, his A1c is very elevated.  Increase by approximately 25%. -  Hemoglobin A1c 12.1 -  Increase Lantus 60 units daily -  Increase aspart 15 units with meals plus low-dose sliding scale insulin plus each bedtime insulin  Hyponatremia, likely due to dehydration and resolving with IV fluids -  Repeat BMP in a.m. -  D/c IVF as eating well  Chronic low back pain, on chronic narcotics -  Continue chronic narcotics, oxycontin 26m BID, verified dose with pharmacy, and oxycodone 12mq4h prn -  Continue stool softeners to prevent constipation  GERD, stable, continue Protonix  Normocytic anemia, mild, hemoglobin stable -  Followup iron studies, B12, folate, TSH -  Occult stool  Depression/anxiety, stable, continue SSRI  Diet:  Diabetic  Access:  PIV  IVF:  Yes  Proph:  Lovenox  Code Status: Full  Family Communication: Patient alone  Disposition Plan: pending improvement in foot infection, general surgery consult   Consultants:  None  Procedures:  X-ray foot  MRI foot  Antibiotics:  Vancomycin 4/8  Zosyn 4/8   HPI/Subjective:  Foot feels better, less painful and tense.  He denies  fevers, chills.  States surgeons would like to observe foot for several more days on IV antibiotics before determining surgery.  Objective: Filed Vitals:   09/30/13 1828 09/30/13 2100 10/01/13 0452 10/01/13 0749  BP: 133/80 170/91 165/91 154/92  Pulse: 97 98 99   Temp: 98.1 F (36.7 C) 99.3 F (37.4 C) 98.3 F (36.8 C)   TempSrc: Oral Oral Oral   Resp: 18 20 17    Height:      Weight:      SpO2: 95% 96% 96%     Intake/Output Summary (Last 24 hours) at 10/01/13 0906 Last data filed at 10/01/13 0600  Gross per 24 hour  Intake   1880 ml  Output      0 ml  Net   1880 ml   Filed Weights   09/28/13 2222 09/29/13 0438  Weight: 127.007 kg (280 lb) 123 kg (271 lb 2.7 oz)    Exam:   General:  Obese Caucasian male, No acute distress  HEENT:  NCAT, MMM  Cardiovascular:  RRR, nl S1, S2 no mrg, 2+ pulses, warm extremities  Respiratory:  CTAB, no increased WOB  Abdomen:   NABS, soft, NT/ND  MSK:   Normal tone and bulk, left foot dorsum of the foot is less swollen, warm to touch, and erythematous.  Persistent dusky patches along the dorsum of the fifth metatarsal now with some vesicles and bulla which appeared purulent and wrapping around to the small ulcer or located underneath the fifth metatarsal. Less foul odor, less clear drainage.  Dorsum of  foot with some serosanguineous drainage.  Neuro:  Grossly intact  Data Reviewed: Basic Metabolic Panel:  Recent Labs Lab 09/29/13 0017 09/29/13 0052 09/29/13 0544 09/30/13 0500 10/01/13 0440  NA 129* 130* 135* 134* 135*  K 3.7 3.7 3.7 3.4* 3.5*  CL 89* 93* 94* 96 97  CO2 26  --  28 28 29   GLUCOSE 452* 437* 301* 241* 199*  BUN 9 8 9 10 6   CREATININE 0.77 0.70 0.71 0.60 0.64  CALCIUM 8.8  --  8.6 8.1* 8.5   Liver Function Tests:  Recent Labs Lab 09/29/13 0017  AST 10  ALT 11  ALKPHOS 74  BILITOT 0.6  PROT 7.4  ALBUMIN 3.3*   No results found for this basename: LIPASE, AMYLASE,  in the last 168 hours No results  found for this basename: AMMONIA,  in the last 168 hours CBC:  Recent Labs Lab 09/29/13 0017 09/29/13 0052 09/29/13 0544 09/30/13 0500 10/01/13 0440  WBC 9.1  --  6.5 5.3 5.7  HGB 11.9* 13.3 11.1* 10.5* 10.5*  HCT 35.7* 39.0 32.5* 30.6* 30.9*  MCV 81.3  --  81.5 81.0 80.9  PLT 263  --  206 212 264   Cardiac Enzymes: No results found for this basename: CKTOTAL, CKMB, CKMBINDEX, TROPONINI,  in the last 168 hours BNP (last 3 results) No results found for this basename: PROBNP,  in the last 8760 hours CBG:  Recent Labs Lab 09/29/13 2050 09/30/13 1138 09/30/13 1643 09/30/13 2118 10/01/13 0713  GLUCAP 256* 206* 215* 258* 216*    Recent Results (from the past 240 hour(s))  WOUND CULTURE     Status: None   Collection Time    09/29/13  9:30 AM      Result Value Ref Range Status   Specimen Description FOOT   Final   Special Requests Immunocompromised   Final   Gram Stain     Final   Value: NO WBC SEEN     NO SQUAMOUS EPITHELIAL CELLS SEEN     ABUNDANT GRAM NEGATIVE RODS     ABUNDANT GRAM POSITIVE COCCI     IN PAIRS IN CLUSTERS   Culture     Final   Value: ABUNDANT GROUP B STREP(S.AGALACTIAE)ISOLATED     Note: TESTING AGAINST S. AGALACTIAE NOT ROUTINELY PERFORMED DUE TO PREDICTABILITY OF AMP/PEN/VAN SUSCEPTIBILITY.     Performed at [facility]   Report Status PENDING   Incomplete     Studies: Mr Foot Left W Wo Contrast  09/29/2013   CLINICAL DATA:  Open wound along the lateral aspect of the left foot.  EXAM: MRI OF THE LEFT FOREFOOT WITHOUT AND WITH CONTRAST  TECHNIQUE: Multiplanar, multisequence MR imaging was performed both before and after administration of intravenous contrast.  CONTRAST:  53m MULTIHANCE GADOBENATE DIMEGLUMINE 529 MG/ML IV SOLN  COMPARISON:  None.  FINDINGS: There is a soft tissue ulcer along the lateral aspect of the left forefoot at the level of the fifth metatarsal head. There is marrow edema within the fifth metatarsal head with  corresponding T1 hypointense signal and cortical irregularity along the plantar aspect of the fifth metatarsal head with enhancement. There is a sinus tract extending from the knee plantar surface of the lateral forefoot to the fifth metatarsal head. There is no focal fluid collection to suggest an abscess. There is mild marrow edema within the base of the fifth proximal phalanx without cortical irregularity likely reactive.  The remainder of the osseous structures are normal. No other  focal marrow signal abnormality. No joint effusions. The flexor, extensor and peroneal tendons are intact.  IMPRESSION: There is a soft tissue ulcer along the lateral aspect of the left forefoot at the level of the fifth metatarsal head. There is marrow edema within the fifth metatarsal head with corresponding T1 hypointense signal and cortical irregularity along the plantar aspect of the fifth metatarsal head with enhancement. The appearance is concerning for osteomyelitis. There is a sinus tract extending from the skin surface to the fifth metatarsal head.   Electronically Signed   By: Kathreen Devoid   On: 09/29/2013 13:02    Scheduled Meds: . docusate sodium  100 mg Oral BID  . enoxaparin (LOVENOX) injection  60 mg Subcutaneous Q24H  . escitalopram  10 mg Oral Daily  . insulin aspart  0-5 Units Subcutaneous QHS  . insulin aspart  0-9 Units Subcutaneous TID WC  . insulin aspart  15 Units Subcutaneous TID WC  . insulin glargine  60 Units Subcutaneous Daily  . lisinopril  40 mg Oral Daily  . OxyCODONE  40 mg Oral Q12H  . pantoprazole  40 mg Oral Daily  . piperacillin-tazobactam (ZOSYN)  IV  3.375 g Intravenous Q8H  . vancomycin  1,250 mg Intravenous Once  . vancomycin  1,000 mg Intravenous Q8H   Continuous Infusions: . sodium chloride 75 mL/hr at 09/29/13 1610    Principal Problem:   Cellulitis Active Problems:   GERD (gastroesophageal reflux disease)   Essential hypertension, benign   Diabetes   Chronic low  back pain   Diabetic foot ulcer   Hyponatremia   Hyperglycemia    Time spent: 30 min    [facility] Pager [phone]. If 7PM-7AM, please contact night-coverage at [email or web address], password [facility] 10/01/2013, 9:06 AM  LOS: 2 days

## 2013-10-01 NOTE — Consult Note (Signed)
Reason for Consult: Left fifth metatarsal osteomyelitis Referring Physician: [facility]  Christopher Raymond is an 38 y.o. male.  HPI: Patient is a 38 year old white male with uncontrolled diabetes mellitus who has been undergoing wound care for a draining sinus in the lateral aspect of the left foot at a wound care center in [place] still, who presents with worsening swelling and redness of the left foot as a whole. She has also had a workup at [facility]. MRI this admission reveals osteomyelitis at the fifth metatarsal head. He does state that the redness and swelling in the left foot has decreased since his admission.  Past Medical History  Diagnosis Date  . GERD (gastroesophageal reflux disease)   . Diabetes mellitus   . HTN (hypertension)   . Depression   . Anxiety   . Chronic pain   . ED (erectile dysfunction)   . Hyperlipidemia   . Diabetic foot ulcer     Past Surgical History  Procedure Laterality Date  . Knee surgery    . Back surgery    . Cholecystectomy  2009  . Tonsillectomy    . Cholecystectomy open      Family History  Problem Relation Age of Onset  . Coronary artery disease Father   . Heart attack Father   . Hypertension Mother     Social History:  reports that he has never smoked. His smokeless tobacco use includes Snuff. He reports that he drinks alcohol. He reports that he does not use illicit drugs.  Allergies:  Allergies  Allergen Reactions  . Levaquin [Levofloxacin] Rash    Medications: I have reviewed the patient's current medications.  Results for orders placed during the hospital encounter of 09/29/13 (from the past 48 hour(s))  GLUCOSE, CAPILLARY     Status: Abnormal   Collection Time    09/29/13  4:46 PM      Result Value Ref Range   Glucose-Capillary 255 (*) 70 - 99 mg/dL   Comment 1 Documented in Chart     Comment 2 Notify RN    GLUCOSE, CAPILLARY     Status: Abnormal   Collection Time    09/29/13  8:50 PM      Result Value Ref  Range   Glucose-Capillary 256 (*) 70 - 99 mg/dL   Comment 1 Notify RN     Comment 2 Documented in Chart    BASIC METABOLIC PANEL     Status: Abnormal   Collection Time    09/30/13  5:00 AM      Result Value Ref Range   Sodium 134 (*) 137 - 147 mEq/L   Potassium 3.4 (*) 3.7 - 5.3 mEq/L   Chloride 96  96 - 112 mEq/L   CO2 28  19 - 32 mEq/L   Glucose, Bld 241 (*) 70 - 99 mg/dL   BUN 10  6 - 23 mg/dL   Creatinine, Ser 0.60  0.50 - 1.35 mg/dL   Calcium 8.1 (*) 8.4 - 10.5 mg/dL   GFR calc non Af Amer >90  >90 mL/min   GFR calc Af Amer >90  >90 mL/min   Comment: (NOTE)     The eGFR has been calculated using the CKD EPI equation.     This calculation has not been validated in all clinical situations.     eGFR's persistently <90 mL/min signify possible Chronic Kidney     Disease.  CBC     Status: Abnormal   Collection Time  09/30/13  5:00 AM      Result Value Ref Range   WBC 5.3  4.0 - 10.5 K/uL   RBC 3.78 (*) 4.22 - 5.81 MIL/uL   Hemoglobin 10.5 (*) 13.0 - 17.0 g/dL   HCT 30.6 (*) 39.0 - 52.0 %   MCV 81.0  78.0 - 100.0 fL   MCH 27.8  26.0 - 34.0 pg   MCHC 34.3  30.0 - 36.0 g/dL   RDW 12.6  11.5 - 15.5 %   Platelets 212  150 - 400 K/uL  SEDIMENTATION RATE     Status: Abnormal   Collection Time    09/30/13  5:00 AM      Result Value Ref Range   Sed Rate 60 (*) 0 - 16 mm/hr  C-REACTIVE PROTEIN     Status: Abnormal   Collection Time    09/30/13  5:00 AM      Result Value Ref Range   CRP 16.8 (*) <0.60 mg/dL   Comment: Performed at [place], CAPILLARY     Status: Abnormal   Collection Time    09/30/13 11:38 AM      Result Value Ref Range   Glucose-Capillary 206 (*) 70 - 99 mg/dL   Comment 1 Notify RN    GLUCOSE, CAPILLARY     Status: Abnormal   Collection Time    09/30/13  4:43 PM      Result Value Ref Range   Glucose-Capillary 215 (*) 70 - 99 mg/dL   Comment 1 Notify RN    GLUCOSE, CAPILLARY     Status: Abnormal   Collection Time    09/30/13   9:18 PM      Result Value Ref Range   Glucose-Capillary 258 (*) 70 - 99 mg/dL   Comment 1 Notify RN     Comment 2 Documented in Chart    BASIC METABOLIC PANEL     Status: Abnormal   Collection Time    10/01/13  4:40 AM      Result Value Ref Range   Sodium 135 (*) 137 - 147 mEq/L   Potassium 3.5 (*) 3.7 - 5.3 mEq/L   Chloride 97  96 - 112 mEq/L   CO2 29  19 - 32 mEq/L   Glucose, Bld 199 (*) 70 - 99 mg/dL   BUN 6  6 - 23 mg/dL   Creatinine, Ser 0.64  0.50 - 1.35 mg/dL   Calcium 8.5  8.4 - 10.5 mg/dL   GFR calc non Af Amer >90  >90 mL/min   GFR calc Af Amer >90  >90 mL/min   Comment: (NOTE)     The eGFR has been calculated using the CKD EPI equation.     This calculation has not been validated in all clinical situations.     eGFR's persistently <90 mL/min signify possible Chronic Kidney     Disease.  CBC     Status: Abnormal   Collection Time    10/01/13  4:40 AM      Result Value Ref Range   WBC 5.7  4.0 - 10.5 K/uL   RBC 3.82 (*) 4.22 - 5.81 MIL/uL   Hemoglobin 10.5 (*) 13.0 - 17.0 g/dL   HCT 30.9 (*) 39.0 - 52.0 %   MCV 80.9  78.0 - 100.0 fL   MCH 27.5  26.0 - 34.0 pg   MCHC 34.0  30.0 - 36.0 g/dL   RDW 12.6  11.5 - 15.5 %  Platelets 264  150 - 400 K/uL  VANCOMYCIN, TROUGH     Status: Abnormal   Collection Time    10/01/13  4:40 AM      Result Value Ref Range   Vancomycin Tr <5.0 (*) 10.0 - 20.0 ug/mL  GLUCOSE, CAPILLARY     Status: Abnormal   Collection Time    10/01/13  7:13 AM      Result Value Ref Range   Glucose-Capillary 216 (*) 70 - 99 mg/dL   Comment 1 Documented in Chart     Comment 2 Notify RN      Mr Foot Left W Wo Contrast  09/29/2013   CLINICAL DATA:  Open wound along the lateral aspect of the left foot.  EXAM: MRI OF THE LEFT FOREFOOT WITHOUT AND WITH CONTRAST  TECHNIQUE: Multiplanar, multisequence MR imaging was performed both before and after administration of intravenous contrast.  CONTRAST:  62m MULTIHANCE GADOBENATE DIMEGLUMINE 529 MG/ML IV SOLN   COMPARISON:  None.  FINDINGS: There is a soft tissue ulcer along the lateral aspect of the left forefoot at the level of the fifth metatarsal head. There is marrow edema within the fifth metatarsal head with corresponding T1 hypointense signal and cortical irregularity along the plantar aspect of the fifth metatarsal head with enhancement. There is a sinus tract extending from the knee plantar surface of the lateral forefoot to the fifth metatarsal head. There is no focal fluid collection to suggest an abscess. There is mild marrow edema within the base of the fifth proximal phalanx without cortical irregularity likely reactive.  The remainder of the osseous structures are normal. No other focal marrow signal abnormality. No joint effusions. The flexor, extensor and peroneal tendons are intact.  IMPRESSION: There is a soft tissue ulcer along the lateral aspect of the left forefoot at the level of the fifth metatarsal head. There is marrow edema within the fifth metatarsal head with corresponding T1 hypointense signal and cortical irregularity along the plantar aspect of the fifth metatarsal head with enhancement. The appearance is concerning for osteomyelitis. There is a sinus tract extending from the skin surface to the fifth metatarsal head.   Electronically Signed   By: HKathreen Devoid  On: 09/29/2013 13:02    ROS: See chart Blood pressure 154/92, pulse 99, temperature 98.3 F (36.8 C), temperature source Oral, resp. rate 17, height 6' (1.829 m), weight 123 kg (271 lb 2.7 oz), SpO2 96.00%. Physical Exam: Pleasant white male in no acute distress. Left foot with some erythema and swelling present. The base of the left fifth toe is somewhat cyanotic in nature. The blister is noted at the fifth metatarsal head base dorsally with granulomatous drainage present. Some soft tissue necrosis is noted in this region. Dorsalis pedis pulse present to palpation. No erythema above the ankle. Assessment/Plan: Impression:  Osteomyelitis of left fifth toe. He does have some resolving cellulitis of the left foot. As this has been chronic in nature, he will either need 6 weeks of IV antibiotics for an amputation of the left fifth toe. I think we will have a better idea as to which way we're going over the next few days. I did explain this to the patient, who understands and agrees to treatment plan.  MJamesetta So4/03/2014, 10:53 AM

## 2013-10-01 NOTE — Progress Notes (Signed)
ANTIBIOTIC CONSULT NOTE- follow up  Pharmacy Consult for Vancomycin and Zosyn Indication: Osteomyelitis  Allergies  Allergen Reactions  . Levaquin [Levofloxacin] Rash   Patient Measurements: Height: 6' (182.9 cm) Weight: 271 lb 2.7 oz (123 kg) IBW/kg (Calculated) : 77.6  Vital Signs: Temp: 98.3 F (36.8 C) (04/10 0452) Temp src: Oral (04/10 0452) BP: 165/91 mmHg (04/10 0452) Pulse Rate: 99 (04/10 0452)  Labs:  Recent Labs  09/29/13 0544 09/30/13 0500 10/01/13 0440  WBC 6.5 5.3 5.7  HGB 11.1* 10.5* 10.5*  PLT 206 212 264  CREATININE 0.71 0.60 0.64   Estimated Creatinine Clearance: 171.3 ml/min (by C-G formula based on Cr of 0.64).   Recent Labs  10/01/13 0440  [place] <5.0*    Microbiology: Recent Results (from the past 720 hour(s))  WOUND CULTURE     Status: None   Collection Time    09/29/13  9:30 AM      Result Value Ref Range Status   Specimen Description FOOT   Final   Special Requests Immunocompromised   Final   Gram Stain     Final   Value: NO WBC SEEN     NO SQUAMOUS EPITHELIAL CELLS SEEN     ABUNDANT GRAM NEGATIVE RODS     ABUNDANT GRAM POSITIVE COCCI     IN PAIRS IN CLUSTERS   Culture     Final   Value: ABUNDANT GROUP B STREP(S.AGALACTIAE)ISOLATED     Note: TESTING AGAINST S. AGALACTIAE NOT ROUTINELY PERFORMED DUE TO PREDICTABILITY OF AMP/PEN/VAN SUSCEPTIBILITY.     Performed at [facility]   Report Status PENDING   Incomplete   Medical History: Past Medical History  Diagnosis Date  . GERD (gastroesophageal reflux disease)   . Diabetes mellitus   . HTN (hypertension)   . Depression   . Anxiety   . Chronic pain   . ED (erectile dysfunction)   . Hyperlipidemia   . Diabetic foot ulcer    Assessment: 38 yo diabetic male with LLE foot ulcer since 05/2013 receiving outpatient wound care; now with foul smelling  drainage, increased pain, and with redness extending to upper leg x 1 day. Pt reports fever and chills x 24hrs.  MRI  + for osteomyelitis.  Vancomycin trough level is below goal.  Goal of Therapy:  Vancomycin trough 15-20 for osteomyelitis  Plan:   Vancomycin 1250mg  IV now x 1 dose then  Increase Vancomycin to 1000mg  IV q8hrs  Recheck Vancomycin trough at steady state  Zosyn 3.375gm IV q8h, each dose over 4 hrs  Monitor labs, renal fxn, and cultures  [employer], RPH 10/01/2013,7:50 AM

## 2013-10-02 LAB — CBC
HCT: 32.3 % — ABNORMAL LOW (ref 39.0–52.0)
HEMOGLOBIN: 11 g/dL — AB (ref 13.0–17.0)
MCH: 27.6 pg (ref 26.0–34.0)
MCHC: 34.1 g/dL (ref 30.0–36.0)
MCV: 81 fL (ref 78.0–100.0)
Platelets: 289 10*3/uL (ref 150–400)
RBC: 3.99 MIL/uL — AB (ref 4.22–5.81)
RDW: 12.5 % (ref 11.5–15.5)
WBC: 5 10*3/uL (ref 4.0–10.5)

## 2013-10-02 LAB — GLUCOSE, CAPILLARY
GLUCOSE-CAPILLARY: 240 mg/dL — AB (ref 70–99)
Glucose-Capillary: 214 mg/dL — ABNORMAL HIGH (ref 70–99)
Glucose-Capillary: 167 mg/dL — ABNORMAL HIGH (ref 70–99)
Glucose-Capillary: 216 mg/dL — ABNORMAL HIGH (ref 70–99)

## 2013-10-02 LAB — BASIC METABOLIC PANEL
BUN: 5 mg/dL — AB (ref 6–23)
CALCIUM: 8.6 mg/dL (ref 8.4–10.5)
CO2: 30 mEq/L (ref 19–32)
Chloride: 98 mEq/L (ref 96–112)
Creatinine, Ser: 0.66 mg/dL (ref 0.50–1.35)
GFR calc Af Amer: >90 mL/min (ref >90)
Glucose, Bld: 225 mg/dL — ABNORMAL HIGH (ref 70–99)
Potassium: 3.9 mEq/L (ref 3.7–5.3)
Sodium: 137 mEq/L (ref 137–147)

## 2013-10-02 NOTE — Progress Notes (Signed)
[facility] PROGRESS NOTE  Christopher Raymond BPZ:[id number] DOB: 13-Jul-1975 DOA: 09/29/2013 PCP: Pcp Not In System  Assessment/Plan  Infected diabetic foot ulcer with surrounding cellulitis >> infection was recently suppressed with augmentin -  Diabetes is uncontrolled with last hemoglobin A1c of approximately 11 and 05/2013 -  Continue vancomycin and zosyn day 4 -  Wound culture:  Prelim - abundant group B strep and staph -  MRI foot:  + osteomyelitis -  General surgery consultation  -  ESR 60, CRP 16.8  Hypertension, blood pressures trending down somewhat  -  Continue lisinopril -  Continue carvedilol   Type 2 diabetes, CBGs in the mid 200s.  Patient's previous total daily insulin requirement was approximately 82 units. States prior to this admission, his CBGs were all in the low 100s, however, his A1c is very elevated.  Increase by approximately 25%. -  Hemoglobin A1c 12.1 -  Continue Lantus 60 units daily -  Continue aspart 15 units with meals plus low-dose sliding scale insulin plus each bedtime insulin  Hyponatremia, likely due to dehydration and resolving with IV fluids -  Repeat BMP in a.m. -  D/c IVF as eating well  Chronic low back pain, on chronic narcotics -  Continue chronic narcotics, oxycontin 30m BID, verified dose with pharmacy, and oxycodone [id number] prn -  Continue stool softeners to prevent constipation  GERD, stable, continue Protonix  Normocytic anemia, mild, hemoglobin stable -  Followup iron studies consistent inflammation, B12 475, folate, TSH 2 -  Occult stool negative -  Repeat iron studies after osteo is resolved to ensure there is no underlying iron deficiency  Depression/anxiety, stable, continue SSRI  Diet:  Diabetic  Access:  PIV  IVF:  Yes  Proph:  Lovenox  Code Status: Full  Family Communication: Patient alone  Disposition Plan: pending improvement in foot infection, general surgery  consult   Consultants:  None  Procedures:  X-ray foot  MRI foot  Antibiotics:  Vancomycin 4/8  Zosyn 4/8   HPI/Subjective:  Foot feels better, afebrile  Objective: Filed Vitals:   10/01/13 0749 10/01/13 1523 10/01/13 1728 10/01/13 2130  BP: 154/92 148/96 146/87 158/95  Pulse:  86  85  Temp:  98.4 F (36.9 C)  99 F (37.2 C)  TempSrc:  Oral  Oral  Resp:  18    Height:      Weight:      SpO2:  95%  98%   No intake or output data in the 24 hours ending 10/02/13 1527 Filed Weights   09/28/13 2222 09/29/13 0438  Weight: 127.007 kg (280 lb) 123 kg (271 lb 2.7 oz)    Exam:   General:  Obese Caucasian male, No acute distress  HEENT:  NCAT, MMM  Cardiovascular:  RRR, nl S1, S2 no mrg, 2+ pulses, warm extremities  Respiratory:  CTAB, no increased WOB  Abdomen:   NABS, soft, NT/ND  MSK:   Normal tone and bulk, left foot dorsum of the foot is mildly swollen, less warm to touch, persistently boggy and erythematous.  Persistent dusky patches along the dorsum of the fifth metatarsal now with some vesicles and bulla which appeared purulent and wrapping around to the small ulcer or located underneath the fifth metatarsal. Less foul odor, less clear drainage.  Dorsum of foot with some serosanguineous drainage.  Neuro:  Grossly intact  Data Reviewed: Basic Metabolic Panel:  Recent Labs Lab 09/29/13 0017 09/29/13 0052 09/29/13 0544 09/30/13 0500 10/01/13 0440 10/02/13 [id number]  NA 129* 130* 135* 134* 135* 137  K 3.7 3.7 3.7 3.4* 3.5* 3.9  CL 89* 93* 94* 96 97 98  CO2 26  --  _0 GLUCOSE 452* 437* 301* 241* 199* 225*  BUN _1 5*  CREATININE 0.77 0.70 0.71 0.60 0.64 0.66  CALCIUM 8.8  --  8.6 8.1* 8.5 8.6   Liver Function Tests:  Recent Labs Lab 09/29/13 0017  AST 10  ALT 11  ALKPHOS 74  BILITOT 0.6  PROT 7.4  ALBUMIN 3.3*   No results found for this basename: LIPASE, AMYLASE,  in the last 168 hours No results found for this basename:  AMMONIA,  in the last 168 hours CBC:  Recent Labs Lab 09/29/13 0017 09/29/13 0052 09/29/13 0544 09/30/13 0500 10/01/13 0440 10/02/13 0637  WBC 9.1  --  6.5 5.3 5.7 5.0  HGB 11.9* 13.3 11.1* 10.5* 10.5* 11.0*  HCT 35.7* 39.0 32.5* 30.6* 30.9* 32.3*  MCV 81.3  --  81.5 81.0 80.9 81.0  PLT 263  --  206 212 264 289   Cardiac Enzymes: No results found for this basename: CKTOTAL, CKMB, CKMBINDEX, TROPONINI,  in the last 168 hours BNP (last 3 results) No results found for this basename: PROBNP,  in the last 8760 hours CBG:  Recent Labs Lab 10/01/13 1149 10/01/13 1638 10/01/13 2053 10/02/13 0847 10/02/13 1154  GLUCAP 230* 166* 193* 240* 214*    Recent Results (from the past 240 hour(s))  WOUND CULTURE     Status: None   Collection Time    09/29/13  9:30 AM      Result Value Ref Range Status   Specimen Description FOOT   Final   Special Requests Immunocompromised   Final   Gram Stain     Final   Value: NO WBC SEEN     NO SQUAMOUS EPITHELIAL CELLS SEEN     ABUNDANT GRAM NEGATIVE RODS     ABUNDANT GRAM POSITIVE COCCI     IN PAIRS IN CLUSTERS   Culture     Final   Value: ABUNDANT GROUP B STREP(S.AGALACTIAE)ISOLATED     Note: TESTING AGAINST S. AGALACTIAE NOT ROUTINELY PERFORMED DUE TO PREDICTABILITY OF AMP/PEN/VAN SUSCEPTIBILITY.     FEW STAPHYLOCOCCUS AUREUS     Note: RIFAMPIN AND GENTAMICIN SHOULD NOT BE USED AS SINGLE DRUGS FOR TREATMENT OF STAPH INFECTIONS.     Performed at [facility]   Report Status PENDING   Incomplete     Studies: No results found.  Scheduled Meds: . carvedilol  6.25 mg Oral BID WC  . docusate sodium  100 mg Oral BID  . enoxaparin (LOVENOX) injection  60 mg Subcutaneous Q24H  . escitalopram  10 mg Oral Daily  . insulin aspart  0-5 Units Subcutaneous QHS  . insulin aspart  0-9 Units Subcutaneous TID WC  . insulin aspart  15 Units Subcutaneous TID WC  . insulin glargine  60 Units Subcutaneous Daily  . lisinopril  40 mg Oral Daily   . OxyCODONE  40 mg Oral Q12H  . pantoprazole  40 mg Oral Daily  . piperacillin-tazobactam (ZOSYN)  IV  3.375 g Intravenous Q8H  . vancomycin  1,000 mg Intravenous Q8H   Continuous Infusions:    Principal Problem:   Cellulitis Active Problems:   GERD (gastroesophageal reflux disease)   Essential hypertension, benign   Diabetes   Chronic low back pain   Diabetic foot ulcer   Hyponatremia  Hyperglycemia    Time spent: 30 min    [facility] Pager [phone]. If 7PM-7AM, please contact night-coverage at [email or web address], password [facility] 10/02/2013, 3:27 PM  LOS: 3 days

## 2013-10-02 NOTE — Progress Notes (Signed)
  Subjective: Feels left foot is slightly better. No significant pain noted.  Objective: Vital signs in last 24 hours: Temp:  [98.4 F (36.9 C)-99 F (37.2 C)] 99 F (37.2 C) (04/10 2130) Pulse Rate:  [85-86] 85 (04/10 2130) Resp:  [18] 18 (04/10 1523) BP: (146-158)/(87-96) 158/95 mmHg (04/10 2130) SpO2:  [95 %-98 %] 98 % (04/10 2130) Last BM Date: 10/01/13  Intake/Output from previous day: 04/10 0701 - 04/11 0700 In: 240 [P.O.:240] Out: -  Intake/Output this shift:    General appearance: alert, cooperative and no distress Extremities: Left foot was swollen. Still with some superficial necrotic skin present at base of left fifth metatarsal. Minimal drainage noted. Less erythema along the dorsum of left foot.  Lab Results:   Recent Labs  10/01/13 0440 10/02/13 0637  WBC 5.7 5.0  HGB 10.5* 11.0*  HCT 30.9* 32.3*  PLT 264 289   BMET  Recent Labs  09/30/13 0500 10/01/13 0440  NA 134* 135*  K 3.4* 3.5*  CL 96 97  CO2 28 29  GLUCOSE 241* 199*  BUN 10 6  CREATININE 0.60 0.64  CALCIUM 8.1* 8.5   PT/INR No results found for this basename: LABPROT, INR,  in the last 72 hours  Studies/Results: No results found.  Anti-infectives: Anti-infectives   Start     Dose/Rate Route Frequency Ordered Stop   10/01/13 1600  vancomycin (VANCOCIN) IVPB 1000 mg/200 mL premix     1,000 mg 200 mL/hr over 60 Minutes Intravenous Every 8 hours 10/01/13 0745     10/01/13 0800  vancomycin (VANCOCIN) 1,250 mg in sodium chloride 0.9 % 250 mL IVPB     1,250 mg 166.7 mL/hr over 90 Minutes Intravenous  Once 10/01/13 0748 10/01/13 0922   09/29/13 1800  vancomycin (VANCOCIN) 1,250 mg in sodium chloride 0.9 % 250 mL IVPB  Status:  Discontinued     1,250 mg 166.7 mL/hr over 90 Minutes Intravenous Every 12 hours 09/29/13 1201 10/01/13 0745   09/29/13 0900  piperacillin-tazobactam (ZOSYN) IVPB 3.375 g     3.375 g 12.5 mL/hr over 240 Minutes Intravenous Every 8 hours 09/29/13 0742     09/29/13 0430  vancomycin (VANCOCIN) 1,500 mg in sodium chloride 0.9 % 500 mL IVPB     1,500 mg 250 mL/hr over 120 Minutes Intravenous  Once 09/29/13 0419 09/29/13 0750   09/28/13 2345  vancomycin (VANCOCIN) IVPB 1000 mg/200 mL premix     1,000 mg 200 mL/hr over 60 Minutes Intravenous  Once 09/28/13 2341 09/29/13 0123      Assessment/Plan: Impression: Osteomyelitis of left fifth metatarsal head, slightly improved. Plan: Continue current therapy. We will reassess in a.m. Patient is resistant to having amputation. Will discuss further with him in a.m.  LOS: 3 days    Jamesetta So 10/02/2013

## 2013-10-03 LAB — BASIC METABOLIC PANEL
BUN: 4 mg/dL — AB (ref 6–23)
CO2: 31 mEq/L (ref 19–32)
Calcium: 8.6 mg/dL (ref 8.4–10.5)
Chloride: 96 mEq/L (ref 96–112)
Creatinine, Ser: 0.67 mg/dL (ref 0.50–1.35)
GFR calc non Af Amer: >90 mL/min (ref >90)
Glucose, Bld: 223 mg/dL — ABNORMAL HIGH (ref 70–99)
POTASSIUM: 3.4 meq/L — AB (ref 3.7–5.3)
Sodium: 137 mEq/L (ref 137–147)

## 2013-10-03 LAB — CBC
HEMATOCRIT: 31.7 % — AB (ref 39.0–52.0)
HEMOGLOBIN: 10.8 g/dL — AB (ref 13.0–17.0)
MCH: 27.6 pg (ref 26.0–34.0)
MCHC: 34.1 g/dL (ref 30.0–36.0)
MCV: 81.1 fL (ref 78.0–100.0)
Platelets: 286 10*3/uL (ref 150–400)
RBC: 3.91 MIL/uL — ABNORMAL LOW (ref 4.22–5.81)
RDW: 12.6 % (ref 11.5–15.5)
WBC: 4.7 10*3/uL (ref 4.0–10.5)

## 2013-10-03 LAB — GLUCOSE, CAPILLARY
GLUCOSE-CAPILLARY: 200 mg/dL — AB (ref 70–99)
Glucose-Capillary: 176 mg/dL — ABNORMAL HIGH (ref 70–99)
Glucose-Capillary: 122 mg/dL — ABNORMAL HIGH (ref 70–99)
Glucose-Capillary: 142 mg/dL — ABNORMAL HIGH (ref 70–99)

## 2013-10-03 LAB — WOUND CULTURE: Gram Stain: NONE SEEN

## 2013-10-03 LAB — FOLATE RBC: RBC Folate: 666 ng/mL — ABNORMAL HIGH (ref >280)

## 2013-10-03 LAB — VANCOMYCIN, TROUGH

## 2013-10-03 MED ORDER — INSULIN ASPART 100 UNIT/ML ~~LOC~~ SOLN
20.0000 [IU] | Freq: Three times a day (TID) | SUBCUTANEOUS | Status: DC
  Administered 2013-10-03 – 2013-10-04 (×4): 20 [IU] via SUBCUTANEOUS
  Administered 2013-10-04: 15 [IU] via SUBCUTANEOUS
  Administered 2013-10-04: 20 [IU] via SUBCUTANEOUS

## 2013-10-03 MED ORDER — POTASSIUM CHLORIDE CRYS ER 20 MEQ PO TBCR
40.0000 meq | EXTENDED_RELEASE_TABLET | Freq: Once | ORAL | Status: AC
  Administered 2013-10-03: 40 meq via ORAL
  Filled 2013-10-03: qty 2

## 2013-10-03 MED ORDER — CARVEDILOL 12.5 MG PO TABS
12.5000 mg | ORAL_TABLET | Freq: Two times a day (BID) | ORAL | Status: DC
  Administered 2013-10-03 – 2013-10-04 (×2): 12.5 mg via ORAL
  Filled 2013-10-03 (×2): qty 1

## 2013-10-03 MED ORDER — INSULIN GLARGINE 100 UNIT/ML ~~LOC~~ SOLN
70.0000 [IU] | Freq: Every day | SUBCUTANEOUS | Status: DC
  Administered 2013-10-03 – 2013-10-06 (×4): 70 [IU] via SUBCUTANEOUS
  Filled 2013-10-03 (×5): qty 0.7

## 2013-10-03 MED ORDER — VANCOMYCIN HCL 10 G IV SOLR
2000.0000 mg | Freq: Two times a day (BID) | INTRAVENOUS | Status: DC
  Administered 2013-10-03 – 2013-10-06 (×7): 2000 mg via INTRAVENOUS
  Filled 2013-10-03 (×9): qty 2000

## 2013-10-03 MED ORDER — MUPIROCIN CALCIUM 2 % EX CREA
TOPICAL_CREAM | Freq: Two times a day (BID) | CUTANEOUS | Status: DC
  Administered 2013-10-03 – 2013-10-08 (×8): via TOPICAL
  Filled 2013-10-03: qty 15

## 2013-10-03 NOTE — Progress Notes (Addendum)
[facility] PROGRESS NOTE  Christopher Raymond ZLD:[id number] DOB: 1976-05-26 DOA: 09/29/2013 PCP: Pcp Not In System  Assessment/Plan  Infected diabetic foot ulcer with surrounding cellulitis >> infection was recently suppressed with augmentin -  Diabetes is uncontrolled with last hemoglobin A1c of approximately 11 and 05/2013 -  Continue vancomycin and zosyn day 5 -  Wound culture:  abundant group B strep and staph.   -  MRI foot:  + osteomyelitis -  General surgery consultation:  Patient trying to avoid surgery -  ESR 60, CRP 16.8 -  PICC line Monday for minimum 6 weeks of vancomycin  Hypertension, blood pressures trending down somewhat  -  Continue lisinopril -  Increase carvedilol   Type 2 diabetes, CBGs still elevated.   -  Hemoglobin A1c 12.1 -  Increase Lantus 70 units daily -  Increase aspart 20 units with meals plus low-dose sliding scale insulin plus each bedtime insulin  Hyponatremia, likely due to dehydration and resolved with hydration  Chronic low back pain, on chronic narcotics -  Continue chronic narcotics, oxycontin 17m BID, verified dose with pharmacy, and oxycodone 167mq4h prn -  Continue stool softeners to prevent constipation  GERD, stable, continue Protonix  Anemia of chronic disease/inflammation.  -  iron studies consistent inflammation, B12 475, folate pending, TSH 2 -  Occult stool negative -  Repeat iron studies after osteo is resolved to ensure there is no underlying iron deficiency  Depression/anxiety, stable, continue SSRI  Hypokalemia, give 1 dose oral potassium  Diet:  Diabetic  Access:  PIV  IVF:  Yes  Proph:  Lovenox  Code Status: Full  Family Communication: Patient alone  Disposition Plan: pending improvement in foot infection, general surgery consult   Consultants:  None  Procedures:  X-ray foot  MRI foot  Antibiotics:  Vancomycin 4/8  Zosyn 4/8   HPI/Subjective:  Foot feeling better.    Objective: Filed  Vitals:   10/01/13 2130 10/02/13 1500 10/02/13 2215 10/03/13 0720  BP: 158/95 132/67 160/83 140/76  Pulse: 85 85 92 93  Temp: 99 F (37.2 C) 98 F (36.7 C) 98.7 F (37.1 C) 97.2 F (36.2 C)  TempSrc: Oral Oral Oral Oral  Resp:  20 20 20   Height:      Weight:      SpO2: 98% 97% 97% 99%    Intake/Output Summary (Last 24 hours) at 10/03/13 0830 Last data filed at 10/02/13 1655  Gross per 24 hour  Intake    450 ml  Output      0 ml  Net    450 ml   Filed Weights   09/28/13 2222 09/29/13 0438  Weight: 127.007 kg (280 lb) 123 kg (271 lb 2.7 oz)    Exam:   General:  Obese Caucasian male, No acute distress  HEENT:  NCAT, MMM  Cardiovascular:  RRR, nl S1, S2 no mrg, 2+ pulses, warm extremities  Respiratory:  CTAB, no increased WOB  Abdomen:   NABS, soft, NT/ND  MSK:   Normal tone and bulk,  dorsum still dark red and dusky.  Less foul odor, less clear drainage.  Dorsum of foot with some serosanguineous drainage.  Neuro:  Grossly intact  Data Reviewed: Basic Metabolic Panel:  Recent Labs Lab 09/29/13 0544 09/30/13 0500 10/01/13 0440 10/02/13 0637 10/03/13 0639  NA 135* 134* 135* 137 137  K 3.7 3.4* 3.5* 3.9 3.4*  CL 94* 96 97 98 96  CO2 28 28 29 30 31   GLUCOSE 301*  241* 199* 225* 223*  BUN 9 10 6  5* 4*  CREATININE 0.71 0.60 0.64 0.66 0.67  CALCIUM 8.6 8.1* 8.5 8.6 8.6   Liver Function Tests:  Recent Labs Lab 09/29/13 0017  AST 10  ALT 11  ALKPHOS 74  BILITOT 0.6  PROT 7.4  ALBUMIN 3.3*   No results found for this basename: LIPASE, AMYLASE,  in the last 168 hours No results found for this basename: AMMONIA,  in the last 168 hours CBC:  Recent Labs Lab 09/29/13 0544 09/30/13 0500 10/01/13 0440 10/02/13 0637 10/03/13 0639  WBC 6.5 5.3 5.7 5.0 4.7  HGB 11.1* 10.5* 10.5* 11.0* 10.8*  HCT 32.5* 30.6* 30.9* 32.3* 31.7*  MCV 81.5 81.0 80.9 81.0 81.1  PLT 206 212 264 289 286   Cardiac Enzymes: No results found for this basename: CKTOTAL, CKMB,  CKMBINDEX, TROPONINI,  in the last 168 hours BNP (last 3 results) No results found for this basename: PROBNP,  in the last 8760 hours CBG:  Recent Labs Lab 10/02/13 0847 10/02/13 1154 10/02/13 1644 10/02/13 2110 10/03/13 0729  GLUCAP 240* 214* 167* 216* 176*    Recent Results (from the past 240 hour(s))  WOUND CULTURE     Status: None   Collection Time    09/29/13  9:30 AM      Result Value Ref Range Status   Specimen Description FOOT   Final   Special Requests Immunocompromised   Final   Gram Stain     Final   Value: NO WBC SEEN     NO SQUAMOUS EPITHELIAL CELLS SEEN     ABUNDANT GRAM NEGATIVE RODS     ABUNDANT GRAM POSITIVE COCCI     IN PAIRS IN CLUSTERS   Culture     Final   Value: ABUNDANT GROUP B STREP(S.AGALACTIAE)ISOLATED     Note: TESTING AGAINST S. AGALACTIAE NOT ROUTINELY PERFORMED DUE TO PREDICTABILITY OF AMP/PEN/VAN SUSCEPTIBILITY.     FEW STAPHYLOCOCCUS AUREUS     Note: RIFAMPIN AND GENTAMICIN SHOULD NOT BE USED AS SINGLE DRUGS FOR TREATMENT OF STAPH INFECTIONS.     Performed at [facility]   Report Status PENDING   Incomplete     Studies: No results found.  Scheduled Meds: . carvedilol  6.25 mg Oral BID WC  . docusate sodium  100 mg Oral BID  . enoxaparin (LOVENOX) injection  60 mg Subcutaneous Q24H  . escitalopram  10 mg Oral Daily  . insulin aspart  0-5 Units Subcutaneous QHS  . insulin aspart  0-9 Units Subcutaneous TID WC  . insulin aspart  20 Units Subcutaneous TID WC  . insulin glargine  70 Units Subcutaneous Daily  . lisinopril  40 mg Oral Daily  . OxyCODONE  40 mg Oral Q12H  . pantoprazole  40 mg Oral Daily  . piperacillin-tazobactam (ZOSYN)  IV  3.375 g Intravenous Q8H  . vancomycin  1,000 mg Intravenous Q8H   Continuous Infusions:    Principal Problem:   Cellulitis Active Problems:   GERD (gastroesophageal reflux disease)   Essential hypertension, benign   Diabetes   Chronic low back pain   Diabetic foot ulcer    Hyponatremia   Hyperglycemia    Time spent: 30 min    [facility] Pager [phone]. If 7PM-7AM, please contact night-coverage at [email or web address], password [facility] 10/03/2013, 8:30 AM  LOS: 4 days

## 2013-10-03 NOTE — Progress Notes (Signed)
  Subjective: No significant complaints.  Objective: Vital signs in last 24 hours: Temp:  [97.2 F (36.2 C)-98.7 F (37.1 C)] 97.2 F (36.2 C) (04/12 0720) Pulse Rate:  [85-93] 93 (04/12 0720) Resp:  [20] 20 (04/12 0720) BP: (132-160)/(67-83) 140/76 mmHg (04/12 0720) SpO2:  [97 %-99 %] 99 % (04/12 0720) Last BM Date: 10/01/13  Intake/Output from previous day: 04/11 0701 - 04/12 0700 In: 500 [IV Piggyback:500] Out: -  Intake/Output this shift:    General appearance: alert, cooperative and no distress Extremities: No descending cellulitis. Still with edematous and dusky soft tissue across the dorsum of the left foot below the metatarsal heads. Limited debridement of superficial dead tissue was performed at bedside. He still has sensation intact.  Lab Results:   Recent Labs  10/02/13 0637 10/03/13 0639  WBC 5.0 4.7  HGB 11.0* 10.8*  HCT 32.3* 31.7*  PLT 289 286   BMET  Recent Labs  10/02/13 0637 10/03/13 0639  NA 137 137  K 3.9 3.4*  CL 98 96  CO2 30 31  GLUCOSE 225* 223*  BUN 5* 4*  CREATININE 0.66 0.67  CALCIUM 8.6 8.6   PT/INR No results found for this basename: LABPROT, INR,  in the last 72 hours  Studies/Results: No results found.  Anti-infectives: Anti-infectives   Start     Dose/Rate Route Frequency Ordered Stop   10/01/13 1600  vancomycin (VANCOCIN) IVPB 1000 mg/200 mL premix     1,000 mg 200 mL/hr over 60 Minutes Intravenous Every 8 hours 10/01/13 0745     10/01/13 0800  vancomycin (VANCOCIN) 1,250 mg in sodium chloride 0.9 % 250 mL IVPB     1,250 mg 166.7 mL/hr over 90 Minutes Intravenous  Once 10/01/13 0748 10/01/13 0922   09/29/13 1800  vancomycin (VANCOCIN) 1,250 mg in sodium chloride 0.9 % 250 mL IVPB  Status:  Discontinued     1,250 mg 166.7 mL/hr over 90 Minutes Intravenous Every 12 hours 09/29/13 1201 10/01/13 0745   09/29/13 0900  piperacillin-tazobactam (ZOSYN) IVPB 3.375 g     3.375 g 12.5 mL/hr over 240 Minutes Intravenous  Every 8 hours 09/29/13 0742     09/29/13 0430  vancomycin (VANCOCIN) 1,500 mg in sodium chloride 0.9 % 500 mL IVPB     1,500 mg 250 mL/hr over 120 Minutes Intravenous  Once 09/29/13 0419 09/29/13 0750   09/28/13 2345  vancomycin (VANCOCIN) IVPB 1000 mg/200 mL premix     1,000 mg 200 mL/hr over 60 Minutes Intravenous  Once 09/28/13 2341 09/29/13 0123      Assessment/Plan: Impression: Osteomyelitis of left fifth metatarsal head with cellulitis of soft tissue, left foot. Diabetes mellitus. Plan: Patient is still very resistant to amputation. His white blood cell count has normalized, by and still concerned about his left foot. Would proceed with PICC line placement and 6 weeks of IV antibiotics. Either myself or podiatry will be following this closely. The patient does realize that he could still require an amputation should current therapy not work. It was stressed to him that he needs to control his blood sugars.  LOS: 4 days    Christopher Raymond 10/03/2013

## 2013-10-03 NOTE — Progress Notes (Signed)
ANTIBIOTIC CONSULT NOTE- follow up  Pharmacy Consult for Vancomycin  Indication: Osteomyelitis  Allergies  Allergen Reactions  . Levaquin [Levofloxacin] Rash   Patient Measurements: Height: 6' (182.9 cm) Weight: 271 lb 2.7 oz (123 kg) IBW/kg (Calculated) : 77.6  Vital Signs: Temp: 97.2 F (36.2 C) (04/12 0720) Temp src: Oral (04/12 0720) BP: 140/76 mmHg (04/12 0720) Pulse Rate: 93 (04/12 0720)  Labs:  Recent Labs  10/01/13 0440 10/02/13 0637 10/03/13 0639  WBC 5.7 5.0 4.7  HGB 10.5* 11.0* 10.8*  PLT 264 289 286  CREATININE 0.64 0.66 0.67   Estimated Creatinine Clearance: 171.3 ml/min (by C-G formula based on Cr of 0.67).   Recent Labs  10/01/13 0440 10/03/13 0639  VANCOTROUGH <5.0* <5.0*    Microbiology: Recent Results (from the past 720 hour(s))  WOUND CULTURE     Status: None   Collection Time    09/29/13  9:30 AM      Result Value Ref Range Status   Specimen Description FOOT   Final   Special Requests Immunocompromised   Final   Gram Stain     Final   Value: NO WBC SEEN     NO SQUAMOUS EPITHELIAL CELLS SEEN     ABUNDANT GRAM NEGATIVE RODS     ABUNDANT GRAM POSITIVE COCCI     IN PAIRS IN CLUSTERS   Culture     Final   Value: FEW STAPHYLOCOCCUS AUREUS     ABUNDANT GROUP B STREP(S.AGALACTIAE)ISOLATED     Note: RIFAMPIN AND GENTAMICIN SHOULD NOT BE USED AS SINGLE DRUGS FOR TREATMENT OF STAPH INFECTIONS.     Performed at [facility]   Report Status 10/03/2013 FINAL   Final   Organism ID, Bacteria STAPHYLOCOCCUS AUREUS   Final   Medical History: Past Medical History  Diagnosis Date  . GERD (gastroesophageal reflux disease)   . Diabetes mellitus   . HTN (hypertension)   . Depression   . Anxiety   . Chronic pain   . ED (erectile dysfunction)   . Hyperlipidemia   . Diabetic foot ulcer    Assessment: 38 yo diabetic male with LLE foot ulcer since 05/2013 receiving outpatient wound care; now with foul smelling  drainage, increased pain,  and with redness extending to upper leg x 1 day. Pt reports fever and chills x 24hrs.  MRI + for osteomyelitis.  Vancomycin trough remains below goal. MD recommendation for PICC line and 6 weeks of therapy  Goal of Therapy:  Vancomycin trough 15-20 for osteomyelitis  Plan:   Change Vancomycin to 2 GM IV every 12 hours  Recheck Vancomycin trough at steady state  Monitor labs, renal fxn, and cultures  Christopher Raymond, [facility] 10/03/2013,9:04 AM

## 2013-10-04 ENCOUNTER — Encounter (HOSPITAL_COMMUNITY): Payer: Medicare Other

## 2013-10-04 LAB — BASIC METABOLIC PANEL
BUN: 6 mg/dL (ref 6–23)
CALCIUM: 8.4 mg/dL (ref 8.4–10.5)
CO2: 29 mEq/L (ref 19–32)
CREATININE: 0.55 mg/dL (ref 0.50–1.35)
Chloride: 97 mEq/L (ref 96–112)
GFR calc Af Amer: >90 mL/min (ref >90)
GLUCOSE: 186 mg/dL — AB (ref 70–99)
Potassium: 3.9 mEq/L (ref 3.7–5.3)
Sodium: 137 mEq/L (ref 137–147)

## 2013-10-04 LAB — GLUCOSE, CAPILLARY
GLUCOSE-CAPILLARY: 182 mg/dL — AB (ref 70–99)
Glucose-Capillary: 164 mg/dL — ABNORMAL HIGH (ref 70–99)
Glucose-Capillary: 170 mg/dL — ABNORMAL HIGH (ref 70–99)
Glucose-Capillary: 102 mg/dL — ABNORMAL HIGH (ref 70–99)

## 2013-10-04 MED ORDER — SODIUM CHLORIDE 0.9 % IJ SOLN
10.0000 mL | Freq: Two times a day (BID) | INTRAMUSCULAR | Status: DC
  Administered 2013-10-04 – 2013-10-08 (×8): 10 mL

## 2013-10-04 MED ORDER — CARVEDILOL 12.5 MG PO TABS
25.0000 mg | ORAL_TABLET | Freq: Two times a day (BID) | ORAL | Status: DC
  Administered 2013-10-04 – 2013-10-08 (×9): 25 mg via ORAL
  Filled 2013-10-04 (×9): qty 2

## 2013-10-04 MED ORDER — INSULIN ASPART 100 UNIT/ML ~~LOC~~ SOLN
15.0000 [IU] | Freq: Three times a day (TID) | SUBCUTANEOUS | Status: DC
  Administered 2013-10-05 – 2013-10-08 (×8): 15 [IU] via SUBCUTANEOUS

## 2013-10-04 MED ORDER — SODIUM CHLORIDE 0.9 % IJ SOLN
10.0000 mL | INTRAMUSCULAR | Status: DC | PRN
  Administered 2013-10-04: 30 mL

## 2013-10-04 NOTE — Progress Notes (Signed)
ANTIBIOTIC CONSULT NOTE- follow up  Pharmacy Consult for Vancomycin  Indication: Osteomyelitis  Allergies  Allergen Reactions  . Levaquin [Levofloxacin] Rash   Patient Measurements: Height: 6' (182.9 cm) Weight: 271 lb 2.7 oz (123 kg) IBW/kg (Calculated) : 77.6  Vital Signs: Temp: 98.3 F (36.8 C) (04/13 0652) Temp src: Oral (04/13 0652) BP: 144/68 mmHg (04/13 0855) Pulse Rate: 95 (04/13 0855)  Labs:  Recent Labs  10/02/13 0637 10/03/13 0639 10/04/13 0834  WBC 5.0 4.7  --   HGB 11.0* 10.8*  --   PLT 289 286  --   CREATININE 0.66 0.67 0.55   Estimated Creatinine Clearance: 171.3 ml/min (by C-G formula based on Cr of 0.55).   Recent Labs  10/03/13 0639  [place] <5.0*    Microbiology: Recent Results (from the past 720 hour(s))  WOUND CULTURE     Status: None   Collection Time    09/29/13  9:30 AM      Result Value Ref Range Status   Specimen Description FOOT   Final   Special Requests Immunocompromised   Final   Gram Stain     Final   Value: NO WBC SEEN     NO SQUAMOUS EPITHELIAL CELLS SEEN     ABUNDANT GRAM NEGATIVE RODS     ABUNDANT GRAM POSITIVE COCCI     IN PAIRS IN CLUSTERS   Culture     Final   Value: FEW STAPHYLOCOCCUS AUREUS     ABUNDANT GROUP B STREP(S.AGALACTIAE)ISOLATED     Note: RIFAMPIN AND GENTAMICIN SHOULD NOT BE USED AS SINGLE DRUGS FOR TREATMENT OF STAPH INFECTIONS.     Performed at [facility]   Report Status 10/03/2013 FINAL   Final   Organism ID, Bacteria STAPHYLOCOCCUS AUREUS   Final   Medical History: Past Medical History  Diagnosis Date  . GERD (gastroesophageal reflux disease)   . Diabetes mellitus   . HTN (hypertension)   . Depression   . Anxiety   . Chronic pain   . ED (erectile dysfunction)   . Hyperlipidemia   . Diabetic foot ulcer    Assessment: 38 yo diabetic male with LLE foot ulcer since 05/2013 receiving outpatient wound care; now with foul smelling  drainage, increased pain, and with redness  extending to upper leg x 1 day. Pt reports fever and chills x 24hrs.  MRI + for osteomyelitis.  Vancomycin trough remains below goal. MD recommendation for PICC line and 6 weeks of therapy  Goal of Therapy:  Vancomycin trough 15-20 for osteomyelitis  Plan:   Anticipate discharge on Vancomycin 2GM IV every 12 hours  Recheck Vancomycin trough level on Wednesday am (4/15)  Check SCr / renal fxn at least twice weekly while on Vancomycin  Check Vancomycin trough level at least weekly during Rx  Monitor labs, renal fxn, and cultures  Ena Dawley, [facility] 10/04/2013,11:02 AM

## 2013-10-04 NOTE — Progress Notes (Signed)
Left foot has worsened with more gangrenous tissue present.  Will proceed with left fifth toe amputation.  Risks and benefits of procedure including bleeding, infection, and possible need for further surgery were fully explained to the patient, who gives informed consent.

## 2013-10-04 NOTE — Clinical Social Work Psychosocial (Signed)
Clinical Social Work Department BRIEF PSYCHOSOCIAL ASSESSMENT 10/04/2013  Patient:  Christopher Raymond, Christopher Raymond     Account Number:  [id number]     Admit date:  09/28/2013  Clinical Social Worker:  Wyatt Haste  Date/Time:  10/04/2013 12:45 PM  Referred by:  Care Management  Date Referred:  10/04/2013 Referred for  SNF Placement   Other Referral:   Interview type:  Patient Other interview type:    PSYCHOSOCIAL DATA Living Status:  ALONE Admitted from facility:   Level of care:   Primary support name:  Janett Billow Primary support relationship to patient:  FRIEND Degree of support available:   supportive per pt    CURRENT CONCERNS Current Concerns  Post-Acute Placement   Other Concerns:    SOCIAL WORK ASSESSMENT / PLAN CSW met with pt at bedside. CM spoke with pt earlier today as pt will require long term antibiotics due to foot infection. Pt states that he was told he was not to be alone because of this and he does not want to go live with his mother or his girlfriend because they have pets and he is worried about this with a PICC line. Pt lives alone and describes his mother and his girlfriend as his best supports. He is independent at baseline. Pt feels SNF would also be helpful in managing his diet. Pt has transportation, but would be unable to drive to outpatient appointments for IV due to his foot. He feels this is his "second chance at saving his foot" and wants to make sure that he does what is best. CSW discussed SNF process and pt is requesting [facility]. SNF list left in room.   Assessment/plan status:  Psychosocial Support/Ongoing Assessment of Needs Other assessment/ plan:   Information/referral to community resources:   SNF list    PATIENT'S/FAMILY'S RESPONSE TO PLAN OF CARE: Pt requesting SNF due to long term IV antibiotics. CSW will fax out referral and follow up with bed offers when available.       Benay Pike, [place]

## 2013-10-04 NOTE — Progress Notes (Signed)
Dr. Arvil Persons office called to tell pt about upcoming appointment. Pt is to go to his office on 4/21 @ 1100 with his insurance cards, current medication list, and copay. Pt is aware and agreeable. Will continue to monitor.

## 2013-10-04 NOTE — Progress Notes (Addendum)
[facility] PROGRESS NOTE  Christopher Raymond HWK:[id number] DOB: [birth year] DOA: 09/29/2013 PCP: Pcp Not In System  Assessment/Plan  Infected diabetic foot ulcer with surrounding cellulitis >> infection was recently suppressed with augmentin -  Diabetes is uncontrolled with last hemoglobin A1c of approximately 11 and 05/2013 -  Continue vancomycin day 6 the -  Wound culture:  abundant group B strep and staph.   -  MRI foot:  + osteomyelitis -  General surgery consultation: 2 OR in a.m. for debridement -  ESR 60, CRP 16.8 -  PICC line Monday for minimum 6 weeks of vancomycin  Hypertension, blood pressures trending down somewhat  -  Continue lisinopril -  increase carvedilol   Type 2 diabetes, CBGs still elevated.   -  Hemoglobin A1c 12.1 -  Continue Lantus 70 units daily -  Continue aspart 20 units with meals plus low-dose sliding scale insulin plus each bedtime insulin  Hyponatremia, likely due to dehydration and resolved with hydration  Chronic low back pain, on chronic narcotics -  Continue chronic narcotics, oxycontin 1m BID, verified dose with pharmacy, and oxycodone 161mq4h prn -  Continue stool softeners to prevent constipation  GERD, stable, continue Protonix  Anemia of chronic disease/inflammation.  -  iron studies consistent inflammation, B12 475, folate pending, TSH 2 -  Occult stool negative -  Repeat iron studies after osteo is resolved to ensure there is no underlying iron deficiency  Depression/anxiety, stable, continue SSRI  Hypokalemia, give 1 dose oral potassium  Diet:  Diabetic  Access:  PICC  IVF:  Yes  Proph:  Lovenox  Code Status: Full  Family Communication: Patient alone  Disposition Plan:  To OR in AM.  Possible d/c to SNF on Thursday  Consultants:  None  Procedures:  X-ray foot  MRI foot  PICC 4/13  Antibiotics:  Vancomycin 4/8  Zosyn 4/8   HPI/Subjective:  Foot still painful and swollen  Objective: Filed Vitals:   10/03/13 2205 10/03/13 2306 10/04/13 0652 10/04/13 0855  BP: 125/60 120/55 133/64 144/68  Pulse: 88 78 86 95  Temp: 98.5 F (36.9 C) 97.7 F (36.5 C) 98.3 F (36.8 C)   TempSrc: Oral Oral Oral   Resp: _0 Height:      Weight:      SpO2: 100% 99% 97%     Intake/Output Summary (Last 24 hours) at 10/04/13 1335 Last data filed at 10/04/13 1012  Gross per 24 hour  Intake   1030 ml  Output      0 ml  Net   1030 ml   Filed Weights   09/28/13 2222 09/29/13 0438  Weight: 127.007 kg (280 lb) 123 kg (271 lb 2.7 oz)    Exam:   General:  Obese Caucasian male, No acute distress  HEENT:  NCAT, MMM  Cardiovascular:  RRR, nl S1, S2 no mrg, 2+ pulses, warm extremities  Respiratory:  CTAB, no increased WOB  Abdomen:   NABS, soft, NT/ND  MSK:   Normal tone and bulk,  dorsum and becoming necrotic with foul odor.   Neuro:  Grossly intact  Data Reviewed: Basic Metabolic Panel:  Recent Labs Lab 09/30/13 0500 10/01/13 0440 10/02/13 0637 10/03/13 0639 10/04/13 0834  NA 134* 135* 137 137 137  K 3.4* 3.5* 3.9 3.4* 3.9  CL 96 97 98 96 97  CO2 _1 GLUCOSE 241* 199* 225* 223* 186*  BUN 10 6 5* 4* 6  CREATININE 0.60  0.64 0.66 0.67 0.55  CALCIUM 8.1* 8.5 8.6 8.6 8.4   Liver Function Tests:  Recent Labs Lab 09/29/13 0017  AST 10  ALT 11  ALKPHOS 74  BILITOT 0.6  PROT 7.4  ALBUMIN 3.3*   No results found for this basename: LIPASE, AMYLASE,  in the last 168 hours No results found for this basename: AMMONIA,  in the last 168 hours CBC:  Recent Labs Lab 09/29/13 0544 09/30/13 0500 10/01/13 0440 10/02/13 0637 10/03/13 0639  WBC 6.5 5.3 5.7 5.0 4.7  HGB 11.1* 10.5* 10.5* 11.0* 10.8*  HCT 32.5* 30.6* 30.9* 32.3* 31.7*  MCV 81.5 81.0 80.9 81.0 81.1  PLT 206 212 264 289 286   Cardiac Enzymes: No results found for this basename: CKTOTAL, CKMB, CKMBINDEX, TROPONINI,  in the last 168 hours BNP (last 3 results) No results found for this basename:  PROBNP,  in the last 8760 hours CBG:  Recent Labs Lab 10/03/13 1203 10/03/13 1707 10/03/13 2040 10/04/13 0756 10/04/13 1130  GLUCAP 200* 122* 142* 164* 170*    Recent Results (from the past 240 hour(s))  WOUND CULTURE     Status: None   Collection Time    09/29/13  9:30 AM      Result Value Ref Range Status   Specimen Description FOOT   Final   Special Requests Immunocompromised   Final   Gram Stain     Final   Value: NO WBC SEEN     NO SQUAMOUS EPITHELIAL CELLS SEEN     ABUNDANT GRAM NEGATIVE RODS     ABUNDANT GRAM POSITIVE COCCI     IN PAIRS IN CLUSTERS   Culture     Final   Value: FEW STAPHYLOCOCCUS AUREUS     ABUNDANT GROUP B STREP(S.AGALACTIAE)ISOLATED     Note: RIFAMPIN AND GENTAMICIN SHOULD NOT BE USED AS SINGLE DRUGS FOR TREATMENT OF STAPH INFECTIONS.     Performed at [facility]   Report Status 10/03/2013 FINAL   Final   Organism ID, Bacteria STAPHYLOCOCCUS AUREUS   Final     Studies: No results found.  Scheduled Meds: . carvedilol  12.5 mg Oral BID WC  . docusate sodium  100 mg Oral BID  . enoxaparin (LOVENOX) injection  60 mg Subcutaneous Q24H  . escitalopram  10 mg Oral Daily  . insulin aspart  0-5 Units Subcutaneous QHS  . insulin aspart  0-9 Units Subcutaneous TID WC  . insulin aspart  20 Units Subcutaneous TID WC  . insulin glargine  70 Units Subcutaneous Daily  . lisinopril  40 mg Oral Daily  . mupirocin cream   Topical BID  . OxyCODONE  40 mg Oral Q12H  . pantoprazole  40 mg Oral Daily  . sodium chloride  10-40 mL Intracatheter Q12H  . vancomycin  2,000 mg Intravenous Q12H   Continuous Infusions:    Principal Problem:   Cellulitis Active Problems:   GERD (gastroesophageal reflux disease)   Essential hypertension, benign   Diabetes   Chronic low back pain   Diabetic foot ulcer   Hyponatremia   Hyperglycemia    Time spent: 30 min    [facility] Pager [phone]. If 7PM-7AM, please contact  night-coverage at [email or web address], password [facility] 10/04/2013, 1:35 PM  LOS: 5 days

## 2013-10-04 NOTE — Progress Notes (Signed)
Spoke with patient about diabetes and home regimen for diabetes control. Patient reports that he takes 70/30 42 units with breakfast and 70/30 40 units with supper as an outpatient for diabetes control.  Discussed A1C results (12.1% on 09/29/13) and explained what an A1C is, basic pathophysiology of DM Type 2, basic home care, importance of checking CBGs and maintaining good CBG control to prevent long-term and short-term complications. Patient reports that he checks his blood sugar twice a day and in the mornings his sugar is in the 100's mg/dl range.  Patient states he does not understand why his A1C is so high because he thought his blood glucose was better.  Discussed impact of stress and infection on glycemic control. Patient seems upset today because he has been informed that he will need amputation.  Provided emotional support and placed call to [facility] to talk with patient. Stressed importance of diabetes control to improve healing from amputation.  Patient verbalized understanding of information discussed and states that he has no further questions at this time.   Blood glucose has trended well on Lantus and Novolog while inpatient.  At time of discharge, may want to consider switching basal insulin from 70/30 to Lantus with Novolog correction and meal coverage.    Thanks, Barnie Alderman, RN, MSN, CCRN Diabetes Coordinator Inpatient Diabetes Program [phone] (Team Pager) [phone] ([place] office) [phone] [facility] office)

## 2013-10-04 NOTE — Clinical Social Work Placement (Signed)
Clinical Social Work Department CLINICAL SOCIAL WORK PLACEMENT NOTE 10/04/2013  Patient:  Christopher Raymond, Christopher Raymond  Account Number:  [id number] Admit date:  09/28/2013  Clinical Social Worker:  Benay Pike, LCSW  Date/time:  10/04/2013 12:44 PM  Clinical Social Work is seeking post-discharge placement for this patient at the following level of care:   [place]   (*CSW will update this form in Epic as items are completed)   10/04/2013  Patient/family provided with [place] Department of Clinical Social Work's list of facilities offering this level of care within the geographic area requested by the patient (or if unable, by the patient's family).  10/04/2013  Patient/family informed of their freedom to choose among providers that offer the needed level of care, that participate in Medicare, Medicaid or managed care program needed by the patient, have an available bed and are willing to accept the patient.  10/04/2013  Patient/family informed of [facility]' ownership interest in [facility], as well as of the fact that they are under no obligation to receive care at this facility.  PASARR submitted to EDS on 10/04/2013 PASARR number received from EDS on 10/04/2013  FL2 transmitted to all facilities in geographic area requested by pt/family on  10/04/2013 FL2 transmitted to all facilities within larger geographic area on   Patient informed that his/her managed care company has contracts with or will negotiate with  certain facilities, including the following:     Patient/family informed of bed offers received:   Patient chooses bed at  Physician recommends and patient chooses bed at    Patient to be transferred to  on   Patient to be transferred to facility by   The following physician request were entered in Epic:   Additional Comments:  Benay Pike, [place]

## 2013-10-05 ENCOUNTER — Encounter (HOSPITAL_COMMUNITY): Payer: Self-pay | Admitting: *Deleted

## 2013-10-05 ENCOUNTER — Encounter (HOSPITAL_COMMUNITY)
Admission: EM | Disposition: A | Discharge status: Skilled Nursing Facility | Payer: Self-pay | Source: Home / Self Care | Attending: Internal Medicine

## 2013-10-05 ENCOUNTER — Encounter (HOSPITAL_COMMUNITY): Payer: Medicare Other | Admitting: Anesthesiology

## 2013-10-05 ENCOUNTER — Inpatient Hospital Stay (HOSPITAL_COMMUNITY): Payer: Medicare Other | Admitting: Anesthesiology

## 2013-10-05 DIAGNOSIS — E1169 Type 2 diabetes mellitus with other specified complication: Secondary | ICD-10-CM

## 2013-10-05 DIAGNOSIS — M869 Osteomyelitis, unspecified: Secondary | ICD-10-CM

## 2013-10-05 DIAGNOSIS — M9080 Osteopathy in diseases classified elsewhere, unspecified site: Secondary | ICD-10-CM

## 2013-10-05 HISTORY — PX: WOUND DEBRIDEMENT: SHX247

## 2013-10-05 LAB — GLUCOSE, CAPILLARY
GLUCOSE-CAPILLARY: 165 mg/dL — AB (ref 70–99)
GLUCOSE-CAPILLARY: 45 mg/dL — AB (ref 70–99)
GLUCOSE-CAPILLARY: 182 mg/dL — AB (ref 70–99)
Glucose-Capillary: 103 mg/dL — ABNORMAL HIGH (ref 70–99)
Glucose-Capillary: 64 mg/dL — ABNORMAL LOW (ref 70–99)
Glucose-Capillary: 251 mg/dL — ABNORMAL HIGH (ref 70–99)

## 2013-10-05 SURGERY — DEBRIDEMENT, WOUND
Anesthesia: General | Site: Foot | Laterality: Left

## 2013-10-05 MED ORDER — LACTATED RINGERS IV SOLN
INTRAVENOUS | Status: DC
  Administered 2013-10-05: 1000 mL via INTRAVENOUS

## 2013-10-05 MED ORDER — PROPOFOL 10 MG/ML IV BOLUS
INTRAVENOUS | Status: AC
  Filled 2013-10-05: qty 20

## 2013-10-05 MED ORDER — DEXTROSE 50 % IV SOLN
INTRAVENOUS | Status: AC
  Filled 2013-10-05: qty 50

## 2013-10-05 MED ORDER — PROPOFOL INFUSION 10 MG/ML OPTIME
INTRAVENOUS | Status: DC | PRN
  Administered 2013-10-05: 13:00:00 via INTRAVENOUS
  Administered 2013-10-05: 45 ug/kg/min via INTRAVENOUS

## 2013-10-05 MED ORDER — FENTANYL CITRATE 0.05 MG/ML IJ SOLN
INTRAMUSCULAR | Status: AC
  Filled 2013-10-05: qty 2

## 2013-10-05 MED ORDER — CHLORHEXIDINE GLUCONATE 4 % EX LIQD: 1.0000 "application " | Freq: Once | CUTANEOUS | Status: DC

## 2013-10-05 MED ORDER — LIDOCAINE HCL (PF) 1 % IJ SOLN
INTRAMUSCULAR | Status: DC | PRN
  Administered 2013-10-05: 7 mL

## 2013-10-05 MED ORDER — ONDANSETRON HCL 4 MG/2ML IJ SOLN
INTRAMUSCULAR | Status: AC
  Filled 2013-10-05: qty 2

## 2013-10-05 MED ORDER — MIDAZOLAM HCL 5 MG/5ML IJ SOLN
INTRAMUSCULAR | Status: DC | PRN
  Administered 2013-10-05 (×2): 1 mg via INTRAVENOUS

## 2013-10-05 MED ORDER — FENTANYL CITRATE 0.05 MG/ML IJ SOLN
25.0000 ug | INTRAMUSCULAR | Status: AC
  Administered 2013-10-05 (×2): 25 ug via INTRAVENOUS

## 2013-10-05 MED ORDER — MIDAZOLAM HCL 2 MG/2ML IJ SOLN
1.0000 mg | INTRAMUSCULAR | Status: DC | PRN
  Administered 2013-10-05 (×2): 2 mg via INTRAVENOUS
  Filled 2013-10-05: qty 2

## 2013-10-05 MED ORDER — MIDAZOLAM HCL 2 MG/2ML IJ SOLN
INTRAMUSCULAR | Status: AC
  Filled 2013-10-05: qty 2

## 2013-10-05 MED ORDER — LIDOCAINE HCL (PF) 1 % IJ SOLN
INTRAMUSCULAR | Status: AC
  Filled 2013-10-05: qty 5

## 2013-10-05 MED ORDER — LIDOCAINE HCL (PF) 1 % IJ SOLN
INTRAMUSCULAR | Status: AC
  Filled 2013-10-05: qty 30

## 2013-10-05 MED ORDER — FENTANYL CITRATE 0.05 MG/ML IJ SOLN
INTRAMUSCULAR | Status: DC | PRN
  Administered 2013-10-05: 50 ug via INTRAVENOUS
  Administered 2013-10-05 (×2): 25 ug via INTRAVENOUS

## 2013-10-05 MED ORDER — SODIUM CHLORIDE 0.9 % IR SOLN
Status: DC | PRN
  Administered 2013-10-05: 1000 mL

## 2013-10-05 MED ORDER — ONDANSETRON HCL 4 MG/2ML IJ SOLN
4.0000 mg | Freq: Once | INTRAMUSCULAR | Status: AC
  Administered 2013-10-05: 4 mg via INTRAVENOUS

## 2013-10-05 MED ORDER — PRO-STAT SUGAR FREE PO LIQD
30.0000 mL | Freq: Three times a day (TID) | ORAL | Status: DC
  Administered 2013-10-05 – 2013-10-08 (×9): 30 mL via ORAL
  Filled 2013-10-05 (×9): qty 30

## 2013-10-05 MED ORDER — ONDANSETRON HCL 4 MG/2ML IJ SOLN: 4.0000 mg | Freq: Once | INTRAMUSCULAR | Status: DC | PRN

## 2013-10-05 MED ORDER — FENTANYL CITRATE 0.05 MG/ML IJ SOLN: 25.0000 ug | INTRAMUSCULAR | Status: DC | PRN

## 2013-10-05 SURGICAL SUPPLY — 36 items
BAG HAMPER (MISCELLANEOUS) ×4 IMPLANT
BANDAGE ELASTIC 4 VELCRO NS (GAUZE/BANDAGES/DRESSINGS) IMPLANT
BLADE GIGLI SAW 510M (BLADE) IMPLANT
BLADE OSC/SAGITTAL MD 9X18.5 (BLADE) IMPLANT
BNDG GAUZE ELAST 4 BULKY (GAUZE/BANDAGES/DRESSINGS) IMPLANT
CANISTER WOUND CARE 500ML ATS (WOUND CARE) ×4 IMPLANT
CLOTH BEACON ORANGE TIMEOUT ST (SAFETY) ×4 IMPLANT
COVER LIGHT HANDLE STERIS (MISCELLANEOUS) ×8 IMPLANT
DECANTER SPIKE VIAL GLASS SM (MISCELLANEOUS) ×4 IMPLANT
DRSG VAC ATS SM SENSATRAC (GAUZE/BANDAGES/DRESSINGS) ×4 IMPLANT
DRSG XEROFORM 1X8 (GAUZE/BANDAGES/DRESSINGS) IMPLANT
ELECT REM PT RETURN 9FT ADLT (ELECTROSURGICAL) ×4
ELECTRODE REM PT RTRN 9FT ADLT (ELECTROSURGICAL) ×2 IMPLANT
FORMALIN 10 PREFIL 120ML (MISCELLANEOUS) IMPLANT
GLOVE BIOGEL PI IND STRL 8 (GLOVE) ×2 IMPLANT
GLOVE BIOGEL PI INDICATOR 8 (GLOVE) ×2
GLOVE ECLIPSE 7.5 STRL STRAW (GLOVE) ×4 IMPLANT
GOWN STRL REUS W/TWL LRG LVL3 (GOWN DISPOSABLE) ×12 IMPLANT
INST SET MINOR BONE (KITS) ×4 IMPLANT
KIT ROOM TURNOVER APOR (KITS) ×4 IMPLANT
MANIFOLD NEPTUNE II (INSTRUMENTS) ×4 IMPLANT
NEEDLE HYPO 25X1 1.5 SAFETY (NEEDLE) ×4 IMPLANT
NS IRRIG 1000ML POUR BTL (IV SOLUTION) ×4 IMPLANT
PACK BASIC LIMB (CUSTOM PROCEDURE TRAY) ×4 IMPLANT
PAD ARMBOARD 7.5X6 YLW CONV (MISCELLANEOUS) ×4 IMPLANT
SET BASIN LINEN APH (SET/KITS/TRAYS/PACK) ×4 IMPLANT
SOL PREP PROV IODINE SCRUB 4OZ (MISCELLANEOUS) ×4 IMPLANT
SPONGE GAUZE 4X4 12PLY (GAUZE/BANDAGES/DRESSINGS) IMPLANT
SPONGE LAP 18X18 X RAY DECT (DISPOSABLE) ×4 IMPLANT
SUT ETHILON 3 0 FSL (SUTURE) IMPLANT
SUT PROLENE 2 0 FS (SUTURE) IMPLANT
SUT VIC AB 3-0 SH 27 (SUTURE)
SUT VIC AB 3-0 SH 27XBRD (SUTURE) IMPLANT
SWAB CULTURE LIQ STUART DBL (MISCELLANEOUS) ×4 IMPLANT
SYR CONTROL 10ML LL (SYRINGE) ×4 IMPLANT
TUBE ANAEROBIC PORT A CUL  W/M (MISCELLANEOUS) IMPLANT

## 2013-10-05 NOTE — Progress Notes (Signed)
INITIAL NUTRITION ASSESSMENT  DOCUMENTATION CODES Per approved criteria  -Obesity Unspecified   INTERVENTION: Follow for diet advancement 30 ml Prostat TID with diet advancement, to support wound healing  NUTRITION DIAGNOSIS: Inadequate oral intake related to inability to eat as evidenced by NPO.   Goal: Pt will meet >90% of estimated nutritional needs  Monitor:  Diet advancement, PO intake, labs, skin assessments, weight changes, I/O's  Reason for Assessment: LOS  38 y.o. male  Admitting Dx: Diabetic osteomyelitis  ASSESSMENT: Pt admitted for diabetic osteomyelitis. Pt with long standing lt fifth toe wound, for which he is followed at [facility] wound clinic. Weight has been stable throughout the past year. Intake has been good (PO: 100%). Pt is currently in surgery for wound debridement and possible amputation.  Discharge disposition is to d/c to SNF for long term IV antibiotics when medically stable.   Height: Ht Readings from Last 1 Encounters:  10/05/13 6' (1.829 m)    Weight: Wt Readings from Last 1 Encounters:  10/05/13 271 lb 2.6 oz (122.999 kg)    Ideal Body Weight: 178#  % Ideal Body Weight: 152%  Wt Readings from Last 10 Encounters:  10/05/13 271 lb 2.6 oz (122.999 kg)  10/05/13 271 lb 2.6 oz (122.999 kg)  07/11/13 289 lb 8 oz (131.316 kg)  06/26/13 301 lb 9.4 oz (136.8 kg)  06/24/13 270 lb (122.471 kg)  06/20/13 285 lb 0.9 oz (129.3 kg)  06/11/13 275 lb (124.739 kg)  05/20/13 275 lb (124.739 kg)  05/03/13 270 lb (122.471 kg)  03/29/13 270 lb (122.471 kg)    Usual Body Weight: 270#  % Usual Body Weight: 100%  BMI:  Body mass index is 36.77 kg/(m^2). Meets criteria for obesity, class II.   Estimated Nutritional Needs: Kcal: 2760-3220 daily Protein: 115-138 grams daily Fluid: 2.8-3.2 L daily  Skin: lt fifth toe wound (osteomyelitis)  Diet Order: NPO  EDUCATION NEEDS: -Education not appropriate at this time   Intake/Output Summary  (Last 24 hours) at 10/05/13 1239 Last data filed at 10/05/13 0800  Gross per 24 hour  Intake   1240 ml  Output      0 ml  Net   1240 ml    Last BM: 10/03/13  Labs:   Recent Labs Lab 10/02/13 0637 10/03/13 0639 10/04/13 0834  NA 137 137 137  K 3.9 3.4* 3.9  CL 98 96 97  CO2 30 31 29   BUN 5* 4* 6  CREATININE 0.66 0.67 0.55  CALCIUM 8.6 8.6 8.4  GLUCOSE 225* 223* 186*    CBG (last 3)   Recent Labs  10/04/13 2122 10/05/13 0733 10/05/13 1120  GLUCAP 182* 165* 103*    Scheduled Meds: . [facility] HOLD] carvedilol  25 mg Oral BID WC  . [facility] HOLD] docusate sodium  100 mg Oral BID  . [MAR HOLD] escitalopram  10 mg Oral Daily  . [MAR HOLD] insulin aspart  0-5 Units Subcutaneous QHS  . [MAR HOLD] insulin aspart  0-9 Units Subcutaneous TID WC  . [MAR HOLD] insulin aspart  15 Units Subcutaneous TID WC  . [MAR HOLD] insulin glargine  70 Units Subcutaneous Daily  . [MAR HOLD] lisinopril  40 mg Oral Daily  . [facility] HOLD] mupirocin cream   Topical BID  . [MAR HOLD] OxyCODONE  40 mg Oral Q12H  . [MAR HOLD] pantoprazole  40 mg Oral Daily  . [MAR HOLD] sodium chloride  10-40 mL Intracatheter Q12H  . [facility] HOLD] vancomycin  2,000 mg Intravenous  Q12H    Continuous Infusions: . lactated ringers 1,000 mL (10/05/13 1148)    Past Medical History  Diagnosis Date  . GERD (gastroesophageal reflux disease)   . Diabetes mellitus   . HTN (hypertension)   . Depression   . Anxiety   . Chronic pain   . ED (erectile dysfunction)   . Hyperlipidemia   . Diabetic foot ulcer     Past Surgical History  Procedure Laterality Date  . Knee surgery    . Back surgery    . Cholecystectomy  2009  . Tonsillectomy    . Cholecystectomy open      [place] A. Jimmye Norman, RD, LDN Pager: [phone]

## 2013-10-05 NOTE — Clinical Social Work Placement (Signed)
Clinical Social Work Department CLINICAL SOCIAL WORK PLACEMENT NOTE 10/05/2013  Patient:  Christopher Raymond, Christopher Raymond  Account Number:  [id number] Admit date:  09/28/2013  Clinical Social Worker:  Benay Pike, LCSW  Date/time:  10/04/2013 12:44 PM  Clinical Social Work is seeking post-discharge placement for this patient at the following level of care:   [place]   (*CSW will update this form in Epic as items are completed)   10/04/2013  Patient/family provided with [place] Department of Clinical Social Work's list of facilities offering this level of care within the geographic area requested by the patient (or if unable, by the patient's family).  10/04/2013  Patient/family informed of their freedom to choose among providers that offer the needed level of care, that participate in Medicare, Medicaid or managed care program needed by the patient, have an available bed and are willing to accept the patient.  10/04/2013  Patient/family informed of [facility]' ownership interest in [facility], as well as of the fact that they are under no obligation to receive care at this facility.  PASARR submitted to EDS on 10/04/2013 PASARR number received from EDS on 10/04/2013  FL2 transmitted to all facilities in geographic area requested by pt/family on  10/04/2013 FL2 transmitted to all facilities within larger geographic area on   Patient informed that his/her managed care company has contracts with or will negotiate with  certain facilities, including the following:     Patient/family informed of bed offers received:  10/05/2013 Patient chooses bed at [place] Physician recommends and patient chooses bed at  [place]  Patient to be transferred to  on   Patient to be transferred to facility by   The following physician request were entered in Epic:   Additional Comments:  Benay Pike, [place]

## 2013-10-05 NOTE — Clinical Social Work Note (Signed)
CSW presented bed offers and pt chooses [facility]. Facility notified. Surgery planned later today.   Christopher Raymond, [place]

## 2013-10-05 NOTE — Op Note (Signed)
Patient:  Christopher Raymond  DOB:  21-Jul-1975  MRN:  [id number]   Preop Diagnosis:  Osteomyelitis of left fifth toe, cellulitis of left foot  Postop Diagnosis:  Same  Procedure:  Debridement and irrigation, left foot, wound VAC placement  Surgeon:  Aviva Signs, M.D.  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 He recently underwent MRI of the left foot which revealed possible osteomyelitis of left foot. Also developed subcutaneous skin gangrene along the dorsum of the left foot. Patient now comes to the operating room for debridement and possible amputation of left fifth toe. Risks and benefits of the procedure including bleeding, infection, the possibility of needing further surgery were fully explained to the patient, who gave informed consent.  Procedure note:  The patient is placed the supine position. After monitored anesthesia care was given, the left foot was prepped and draped using usual sterile technique with Betadine. Surgical site confirmation was performed. 1% Xylocaine was used for local anesthesia.  The necrotic tissue that was on the dorsum of the left foot, proximal to the metatarsal heads was excised using a scalpel. A full thickness skin excision was performed. Subcutaneous tissue was also debrided. No bone or tendon was debrided. Purulent fluid was found and this was cultured and sent to microbiology. The wound is then irrigated copiously with normal saline. The final wound dimensions were 3 x 6 cm in size. A wound VAC sponge was then placed and connected to the wound VAC. A good seal was obtained at -125. The patient then went to PACU in stable condition. He did appear I did not have to remove the left fifth toe. The joint space at the fifth metatarsal head appeared to be okay, with normal tissue surrounding  it. I did not explored further at this time.  Complications:  None  EBL:  Minimal  Specimen:  Culture of left foot

## 2013-10-05 NOTE — Transfer of Care (Signed)
Immediate Anesthesia Transfer of Care Note  Patient: Christopher Raymond  Procedure(s) Performed: Procedure(s): INCISION AND DEBRIDEMENT LEFT FOOT (Left)  Patient Location: PACU  Anesthesia Type:MAC  Level of Consciousness: awake, alert  and patient cooperative  Airway & Oxygen Therapy: Patient Spontanous Breathing and Patient connected to nasal cannula oxygen  Post-op Assessment: Report given to PACU RN, Post -op Vital signs reviewed and stable and Patient moving all extremities  Post vital signs: Reviewed and stable  Complications: No apparent anesthesia complications

## 2013-10-05 NOTE — Anesthesia Postprocedure Evaluation (Signed)
  Anesthesia Post-op Note  Patient: Christopher Raymond  Procedure(s) Performed: Procedure(s): INCISION AND DEBRIDEMENT LEFT FOOT (Left)  Patient Location: PACU  Anesthesia Type:MAC  Level of Consciousness: awake, alert , oriented and patient cooperative  Airway and Oxygen Therapy: Patient Spontanous Breathing  Post-op Pain: 3 /10, mild  Post-op Assessment: Post-op Vital signs reviewed, Patient's Cardiovascular Status Stable, Respiratory Function Stable, Patent Airway, No signs of Nausea or vomiting and Pain level controlled  Post-op Vital Signs: Reviewed and stable  Last Vitals:  Filed Vitals:   10/05/13 1230  BP: 116/74  Pulse:   Temp:   Resp:     Complications: No apparent anesthesia complications

## 2013-10-05 NOTE — Anesthesia Preprocedure Evaluation (Addendum)
Anesthesia Evaluation  Patient identified by MRN, date of birth, ID band Patient awake    Reviewed: Allergy & Precautions, H&P , NPO status , Patient's Chart, lab work & pertinent test results  Airway       Dental   Pulmonary neg pulmonary ROS,  breath sounds clear to auscultation        Cardiovascular hypertension, Pt. on medications Rhythm:Regular Rate:Normal     Neuro/Psych PSYCHIATRIC DISORDERS Anxiety Depression    GI/Hepatic GERD-  Medicated and Controlled,  Endo/Other  diabetes, Poorly Controlled, Type 2, Oral Hypoglycemic Agents, Insulin Dependent  Renal/GU      Musculoskeletal   Abdominal   Peds  Hematology   Anesthesia Other Findings   Reproductive/Obstetrics                        Anesthesia Physical Anesthesia Plan  ASA: III  Anesthesia Plan: MAC   Post-op Pain Management:    Induction: Intravenous  Airway Management Planned: Simple Face Mask  Additional Equipment:   Intra-op Plan:   Post-operative Plan:   Informed Consent: I have reviewed the patients History and Physical, chart, labs and discussed the procedure including the risks, benefits and alternatives for the proposed anesthesia with the patient or authorized representative who has indicated his/her understanding and acceptance.     Plan Discussed with: CRNA, Anesthesiologist and Surgeon  Anesthesia Plan Comments:      Anesthesia Quick Evaluation

## 2013-10-05 NOTE — Progress Notes (Addendum)
[facility] PROGRESS NOTE  JONIEL GRAUMANN RJJ:[id number] DOB: March 04, 1976 DOA: 09/29/2013 PCP: Pcp Not In System  Brief summary  Christopher Raymond is a 38 y.o. male with a history of DM2 on Insulin Rx who presents to the ED with complaints of increased redness, pain and foul smelling drainage from his left foot wound.  Found to have osteomyelitis of the 5th metatarsal with GBS and MSSA, but patient declined amputation of the affected area.  Plan to treat for 6 weeks with IV antibiotics.  Dorsum of foot became necrotic and was debrided on 4/14.    Assessment/Plan  Infected diabetic foot ulcer with surrounding cellulitis due to GBS and MSSA -  Diabetes is uncontrolled with last hemoglobin A1c of approximately 11 and 05/2013 -  Continue vancomycin day 7 >> consider narrowing coverage to ampicillin -  Wound culture:  abundant group B strep and MSSA   -  MRI foot:  + osteomyelitis -  To OR today for debridement  -  ESR 60, CRP 16.8 -  Will need minimum of 6-weeks of antibiotics  Hypertension, blood pressures trending down somewhat   -  Continue lisinopril -  Continue carvedilol   Type 2 diabetes, CBGs still elevated.   -  Hemoglobin A1c 12.1 -  Continue Lantus 70 units daily -  Continue aspart 20 units with meals plus low-dose sliding scale insulin plus each bedtime insulin  Hyponatremia, likely due to dehydration and resolved with hydration  Chronic low back pain, on chronic narcotics -  Continue chronic narcotics, oxycontin 28m BID, verified dose with pharmacy, and oxycodone 131mq4h prn -  Continue stool softeners to prevent constipation  GERD, stable, continue Protonix  Anemia of chronic disease/inflammation.  -  iron studies consistent inflammation, B12 475, folate pending, TSH 2 -  Occult stool negative -  Repeat iron studies after osteo is resolved to ensure there is no underlying iron deficiency  Depression/anxiety, stable, continue SSRI  Hypokalemia, give 1 dose oral  potassium  Diet:  Diabetic  Access:  PICC  IVF:  Yes  Proph:  Lovenox  Code Status: Full  Family Communication: Patient alone  Disposition Plan:  To OR today  Consultants:  None  Procedures:  X-ray foot  MRI foot  PICC 4/13  Antibiotics:  Vancomycin 4/8  Zosyn 4/8   HPI/Subjective:  Foot persistently blackening  Objective: Filed Vitals:   10/05/13 0432 10/05/13 0837 10/05/13 1119 10/05/13 1123  BP: 166/88 163/94    Pulse: 81 89    Temp: 98.1 F (36.7 C) 98.5 F (36.9 C)  98.1 F (36.7 C)  TempSrc: Oral Oral  Oral  Resp: 20 20    Height:   6' (1.829 m)   Weight:   122.999 kg (271 lb 2.6 oz)   SpO2: 97% 97%      Intake/Output Summary (Last 24 hours) at 10/05/13 1139 Last data filed at 10/05/13 0800  Gross per 24 hour  Intake   1240 ml  Output      0 ml  Net   1240 ml   Filed Weights   09/28/13 2222 09/29/13 0438 10/05/13 1119  Weight: 127.007 kg (280 lb) 123 kg (271 lb 2.7 oz) 122.999 kg (271 lb 2.6 oz)    Exam:   General:  Obese Caucasian male, No acute distress, being taken to OR  Data Reviewed: Basic Metabolic Panel:  Recent Labs Lab 09/30/13 0500 10/01/13 0440 10/02/13 0637 10/03/13 0639 10/04/13 0834  NA 134* 135* 137 137  137  K 3.4* 3.5* 3.9 3.4* 3.9  CL 96 97 98 96 97  CO2 28 29 30 31 29   GLUCOSE 241* 199* 225* 223* 186*  BUN 10 6 5* 4* 6  CREATININE 0.60 0.64 0.66 0.67 0.55  CALCIUM 8.1* 8.5 8.6 8.6 8.4   Liver Function Tests:  Recent Labs Lab 09/29/13 0017  AST 10  ALT 11  ALKPHOS 74  BILITOT 0.6  PROT 7.4  ALBUMIN 3.3*   No results found for this basename: LIPASE, AMYLASE,  in the last 168 hours No results found for this basename: AMMONIA,  in the last 168 hours CBC:  Recent Labs Lab 09/29/13 0544 09/30/13 0500 10/01/13 0440 10/02/13 0637 10/03/13 0639  WBC 6.5 5.3 5.7 5.0 4.7  HGB 11.1* 10.5* 10.5* 11.0* 10.8*  HCT 32.5* 30.6* 30.9* 32.3* 31.7*  MCV 81.5 81.0 80.9 81.0 81.1  PLT 206 212 264 289  286   Cardiac Enzymes: No results found for this basename: CKTOTAL, CKMB, CKMBINDEX, TROPONINI,  in the last 168 hours BNP (last 3 results) No results found for this basename: PROBNP,  in the last 8760 hours CBG:  Recent Labs Lab 10/04/13 1130 10/04/13 1656 10/04/13 2122 10/05/13 0733 10/05/13 1120  GLUCAP 170* 102* 182* 165* 103*    Recent Results (from the past 240 hour(s))  WOUND CULTURE     Status: None   Collection Time    09/29/13  9:30 AM      Result Value Ref Range Status   Specimen Description FOOT   Final   Special Requests Immunocompromised   Final   Gram Stain     Final   Value: NO WBC SEEN     NO SQUAMOUS EPITHELIAL CELLS SEEN     ABUNDANT GRAM NEGATIVE RODS     ABUNDANT GRAM POSITIVE COCCI     IN PAIRS IN CLUSTERS   Culture     Final   Value: FEW STAPHYLOCOCCUS AUREUS     ABUNDANT GROUP B STREP(S.AGALACTIAE)ISOLATED     Note: RIFAMPIN AND GENTAMICIN SHOULD NOT BE USED AS SINGLE DRUGS FOR TREATMENT OF STAPH INFECTIONS.     Performed at [facility]   Report Status 10/03/2013 FINAL   Final   Organism ID, Bacteria STAPHYLOCOCCUS AUREUS   Final     Studies: No results found.  Scheduled Meds: . [facility] HOLD] carvedilol  25 mg Oral BID WC  . [facility] HOLD] docusate sodium  100 mg Oral BID  . [MAR HOLD] escitalopram  10 mg Oral Daily  . fentaNYL  25 mcg Intravenous Q10 min  . [MAR HOLD] insulin aspart  0-5 Units Subcutaneous QHS  . [[facility] HOLD] insulin aspart  0-9 Units Subcutaneous TID WC  . [MAR HOLD] insulin aspart  15 Units Subcutaneous TID WC  . [MAR HOLD] insulin glargine  70 Units Subcutaneous Daily  . [MAR HOLD] lisinopril  40 mg Oral Daily  . [facility] HOLD] mupirocin cream   Topical BID  . ondansetron (ZOFRAN) IV  4 mg Intravenous Once  . [[facility] HOLD] OxyCODONE  40 mg Oral Q12H  . [MAR HOLD] pantoprazole  40 mg Oral Daily  . [MAR HOLD] sodium chloride  10-40 mL Intracatheter Q12H  . [facility] HOLD] vancomycin  2,000 mg Intravenous Q12H   Continuous  Infusions: . lactated ringers      Principal Problem:   Cellulitis Active Problems:   GERD (gastroesophageal reflux disease)   Essential hypertension, benign   Diabetes   Chronic low back pain  Diabetic foot ulcer   Hyponatremia   Hyperglycemia    Time spent: 30 min    [facility] Pager [phone]. If 7PM-7AM, please contact night-coverage at [email or web address], password [email or web address] 10/05/2013, 11:39 AM  LOS: 6 days

## 2013-10-06 ENCOUNTER — Encounter (HOSPITAL_COMMUNITY): Payer: Self-pay | Admitting: General Surgery

## 2013-10-06 DIAGNOSIS — M86179 Other acute osteomyelitis, unspecified ankle and foot: Secondary | ICD-10-CM

## 2013-10-06 LAB — GLUCOSE, CAPILLARY
GLUCOSE-CAPILLARY: 55 mg/dL — AB (ref 70–99)
Glucose-Capillary: 184 mg/dL — ABNORMAL HIGH (ref 70–99)
Glucose-Capillary: 204 mg/dL — ABNORMAL HIGH (ref 70–99)
Glucose-Capillary: 50 mg/dL — ABNORMAL LOW (ref 70–99)
Glucose-Capillary: 120 mg/dL — ABNORMAL HIGH (ref 70–99)
Glucose-Capillary: 253 mg/dL — ABNORMAL HIGH (ref 70–99)

## 2013-10-06 LAB — BASIC METABOLIC PANEL
BUN: 8 mg/dL (ref 6–23)
CALCIUM: 8.5 mg/dL (ref 8.4–10.5)
CO2: 31 meq/L (ref 19–32)
Chloride: 97 mEq/L (ref 96–112)
Creatinine, Ser: 0.59 mg/dL (ref 0.50–1.35)
GFR calc non Af Amer: >90 mL/min (ref >90)
Glucose, Bld: 197 mg/dL — ABNORMAL HIGH (ref 70–99)
Potassium: 3.9 mEq/L (ref 3.7–5.3)
SODIUM: 137 meq/L (ref 137–147)

## 2013-10-06 MED ORDER — CEFAZOLIN SODIUM-DEXTROSE 2-3 GM-% IV SOLR
2.0000 g | Freq: Three times a day (TID) | INTRAVENOUS | Status: DC
  Administered 2013-10-06 – 2013-10-08 (×6): 2 g via INTRAVENOUS
  Filled 2013-10-06 (×15): qty 50

## 2013-10-06 MED ORDER — INSULIN GLARGINE 100 UNIT/ML ~~LOC~~ SOLN
60.0000 [IU] | Freq: Every day | SUBCUTANEOUS | Status: DC
  Administered 2013-10-07: 10 [IU] via SUBCUTANEOUS
  Administered 2013-10-07 – 2013-10-08 (×2): 60 [IU] via SUBCUTANEOUS
  Filled 2013-10-06 (×5): qty 0.6

## 2013-10-06 NOTE — Progress Notes (Signed)
Inpatient Diabetes Program Recommendations  AACE/ADA: New Consensus Statement on Inpatient Glycemic Control (2013)  Target Ranges:  Prepandial:   less than 140 mg/dL      Peak postprandial:   less than 180 mg/dL (1-2 hours)      Critically ill patients:  140 - 180 mg/dL   Results for Christopher Raymond, Christopher Raymond (MRN [id number]) as of 10/06/2013 09:25  Ref. Range 10/05/2013 07:33 10/05/2013 11:20 10/05/2013 13:34 10/05/2013 14:09 10/05/2013 16:32 10/05/2013 22:02 10/06/2013 07:45  Glucose-Capillary Latest Range: 70-99 mg/dL 165 (H) 103 (H) 45 (L) 64 (L) 251 (H) 182 (H) 184 (H)  Diabetes history: DM2  Outpatient Diabetes medications: Novolog 70/30 42 units with breakfast, Novolog 70/30 40 units with supper  Current orders for Inpatient glycemic control: Lantus 70 units daily, Novolog 0-9 units AC, Novolog 0-5 units HS, Novolog 15 units TID with meals  Inpatient Diabetes Program Recommendations Insulin - Meal Coverage: Noted Novolog 15 units meal coverage given yesterday at 9:41 despite patient being NPO for surgery which resulted in patient being hypoglycemic.  NURSING: Please do not give Novolog meal coverage if patient is NPO or does not eat at least 50% of meal.  Do not recommend any changes in insulin regimen at this time.   Thanks, Barnie Alderman, RN, MSN, CCRN Diabetes Coordinator Inpatient Diabetes Program [phone] (Team Pager) [phone] ([facility] office) [phone] [facility] office)

## 2013-10-06 NOTE — Addendum Note (Signed)
Addendum created 10/06/13 1235 by Charmaine Downs, CRNA   Modules edited: Notes Section   Notes Section:  File: [id number]

## 2013-10-06 NOTE — Addendum Note (Signed)
Addendum created 10/06/13 1118 by Ollen Bowl, CRNA   Modules edited: Anesthesia Flowsheet

## 2013-10-06 NOTE — Progress Notes (Signed)
PROGRESS NOTE  Christopher Raymond ACZ:[id number] DOB: 01/02/1976 DOA: 09/29/2013 PCP: Pcp Not In System  Assessment/Plan: Infected diabetic foot ulcer   -GBS and MSSA--09/29/2013 wound culture - Diabetes is uncontrolled with last hemoglobin A1c of approximately 11 and 05/2013  - d/c vancomycin day 8   -Start cefazolin IV - Wound culture: abundant group B strep and MSSA  - MRI foot: + osteomyelitis  - To OR today for debridement  - ESR 60, CRP 16.8  - Tentative stop the antibiotics 11/09/2013 Acute osteomyelitis right fifth metatarsal -Continue wound care -Continue IV antibiotics -Repeat ESR, CRP Hypertension, blood pressures trending down somewhat  - Continue lisinopril  - Continue carvedilol  Type 2 diabetes, uncontrolled - Hemoglobin A1c 12.1--09/29/2013 - Decrease Lantus 60 units daily due to mild hypoglycemia - Continue aspart 15 units with meals plus low-dose sliding scale insulin plus each bedtime insulin  Hyponatremia -likely due to dehydration and resolved with hydration  Chronic low back pain, on chronic narcotics  - Continue chronic narcotics, oxycontin 44m BID, verified dose with pharmacy, and oxycodone 155mq4h prn  - Continue stool softeners to prevent constipation  GERD, stable, continue Protonix  Anemia of chronic disease/inflammation.  - iron studies consistent inflammation, B12 475, folate pending, TSH 2  - Occult stool negative  - Repeat iron studies after osteo is resolved to ensure there is no underlying iron deficiency  Depression/anxiety,  -stable, continue SSRI   Family Communication:   Pt at beside Disposition Plan:   SNF 4/16 or 4/17       Procedures/Studies: Mr Foot Left W Wo Contrast  09/29/2013   CLINICAL DATA:  Open wound along the lateral aspect of the left foot.  EXAM: MRI OF THE LEFT FOREFOOT WITHOUT AND WITH CONTRAST  TECHNIQUE: Multiplanar, multisequence MR imaging was performed both before and after administration of  intravenous contrast.  CONTRAST:  2079mULTIHANCE GADOBENATE DIMEGLUMINE 529 MG/ML IV SOLN  COMPARISON:  None.  FINDINGS: There is a soft tissue ulcer along the lateral aspect of the left forefoot at the level of the fifth metatarsal head. There is marrow edema within the fifth metatarsal head with corresponding T1 hypointense signal and cortical irregularity along the plantar aspect of the fifth metatarsal head with enhancement. There is a sinus tract extending from the knee plantar surface of the lateral forefoot to the fifth metatarsal head. There is no focal fluid collection to suggest an abscess. There is mild marrow edema within the base of the fifth proximal phalanx without cortical irregularity likely reactive.  The remainder of the osseous structures are normal. No other focal marrow signal abnormality. No joint effusions. The flexor, extensor and peroneal tendons are intact.  IMPRESSION: There is a soft tissue ulcer along the lateral aspect of the left forefoot at the level of the fifth metatarsal head. There is marrow edema within the fifth metatarsal head with corresponding T1 hypointense signal and cortical irregularity along the plantar aspect of the fifth metatarsal head with enhancement. The appearance is concerning for osteomyelitis. There is a sinus tract extending from the skin surface to the fifth metatarsal head.   Electronically Signed   By: HetKathreen DevoidOn: 09/29/2013 13:02   Dg Foot Complete Left  09/29/2013   CLINICAL DATA:  Ulceration at the lateral aspect of the left foot.  EXAM: LEFT FOOT - COMPLETE 3+ VIEW  COMPARISON:  Left foot MRI performed 06/28/2013, and left foot radiographs performed  06/24/2013  FINDINGS: There is no evidence of fracture or dislocation. There is no evidence of osseous erosion. The joint spaces are preserved. There is no evidence of talar subluxation; the subtalar joint is unremarkable in appearance.  Diffuse soft tissue swelling is noted about the foot. Known  soft tissue ulceration is not well characterized on radiograph. No radiopaque foreign bodies are seen.  IMPRESSION: No evidence of fracture or dislocation. No evidence of osseous erosion. No radiopaque foreign bodies seen.   Electronically Signed   By: Garald Balding M.D.   On: 09/29/2013 00:49         Subjective: Patient denies fevers, chills, headache, chest pain, dyspnea, nausea, vomiting, diarrhea, abdominal pain, dysuria, hematuria   Objective: Filed Vitals:   10/05/13 2358 10/06/13 0625 10/06/13 0913 10/06/13 1343  BP: 124/80 155/84  123/77  Pulse: 87 71 78 77  Temp: 98 F (36.7 C) 97.9 F (36.6 C)  97 F (36.1 C)  TempSrc: Oral Oral  Oral  Resp: 20 20  20   Height:      Weight:      SpO2: 99% 99%  98%    Intake/Output Summary (Last 24 hours) at 10/06/13 1915 Last data filed at 10/06/13 1349  Gross per 24 hour  Intake   2480 ml  Output   3200 ml  Net   -720 ml   Weight change:  Exam:   General:  Pt is alert, follows commands appropriately, not in acute distress  HEENT: No icterus, No thrush,[place]/AT  Cardiovascular: RRR, S1/S2, no rubs, no gallops  Respiratory: CTA bilaterally, no wheezing, no crackles, no rhonchi  Abdomen: Soft/+BS, non tender, non distended, no guarding  Extremities: 1+LE edema, No lymphangitis, No petechiae, No rashes, no synovitis  Data Reviewed: Basic Metabolic Panel:  Recent Labs Lab 10/01/13 0440 10/02/13 0637 10/03/13 0639 10/04/13 0834 10/06/13 0834  NA 135* 137 137 137 137  K 3.5* 3.9 3.4* 3.9 3.9  CL 97 98 96 97 97  CO2 29 30 31 29 31   GLUCOSE 199* 225* 223* 186* 197*  BUN 6 5* 4* 6 8  CREATININE 0.64 0.66 0.67 0.55 0.59  CALCIUM 8.5 8.6 8.6 8.4 8.5   Liver Function Tests: No results found for this basename: AST, ALT, ALKPHOS, BILITOT, PROT, ALBUMIN,  in the last 168 hours No results found for this basename: LIPASE, AMYLASE,  in the last 168 hours No results found for this basename: AMMONIA,  in the last 168  hours CBC:  Recent Labs Lab 09/30/13 0500 10/01/13 0440 10/02/13 0637 10/03/13 0639  WBC 5.3 5.7 5.0 4.7  HGB 10.5* 10.5* 11.0* 10.8*  HCT 30.6* 30.9* 32.3* 31.7*  MCV 81.0 80.9 81.0 81.1  PLT 212 264 289 286   Cardiac Enzymes: No results found for this basename: CKTOTAL, CKMB, CKMBINDEX, TROPONINI,  in the last 168 hours BNP: No components found with this basename: POCBNP,  CBG:  Recent Labs Lab 10/06/13 0745 10/06/13 1136 10/06/13 1547 10/06/13 1629 10/06/13 1703  GLUCAP 184* 204* 50* 55* 120*    Recent Results (from the past 240 hour(s))  WOUND CULTURE     Status: None   Collection Time    09/29/13  9:30 AM      Result Value Ref Range Status   Specimen Description FOOT   Final   Special Requests Immunocompromised   Final   Gram Stain     Final   Value: NO WBC SEEN     NO SQUAMOUS EPITHELIAL CELLS SEEN  ABUNDANT GRAM NEGATIVE RODS     ABUNDANT GRAM POSITIVE COCCI     IN PAIRS IN CLUSTERS   Culture     Final   Value: FEW STAPHYLOCOCCUS AUREUS     ABUNDANT GROUP B STREP(S.AGALACTIAE)ISOLATED     Note: RIFAMPIN AND GENTAMICIN SHOULD NOT BE USED AS SINGLE DRUGS FOR TREATMENT OF STAPH INFECTIONS.     Performed at [facility]   Report Status 10/03/2013 FINAL   Final   Organism ID, Bacteria STAPHYLOCOCCUS AUREUS   Final  WOUND CULTURE     Status: None   Collection Time    10/05/13  1:00 PM      Result Value Ref Range Status   Specimen Description WOUND LEFT 5TH TOE   Final   Special Requests VANCOMYCIN 2057m   Final   Gram Stain     Final   Value: ABUNDANT WBC PRESENT,BOTH PMN AND MONONUCLEAR     NO SQUAMOUS EPITHELIAL CELLS SEEN     MODERATE GRAM NEGATIVE RODS     Performed at [facility]  Culture     Final   Value: NO GROWTH 1 DAY     Performed at [facility]  Report Status PENDING   Incomplete     Scheduled Meds: . carvedilol  25 mg Oral BID WC  .  ceFAZolin (ANCEF) IV  2 g Intravenous 3 times per day  . docusate  sodium  100 mg Oral BID  . escitalopram  10 mg Oral Daily  . feeding supplement (PRO-STAT SUGAR FREE 64)  30 mL Oral TID WC  . insulin aspart  0-9 Units Subcutaneous TID WC  . insulin aspart  15 Units Subcutaneous TID WC  . [START ON 10/07/2013] insulin glargine  60 Units Subcutaneous Daily  . lisinopril  40 mg Oral Daily  . mupirocin cream   Topical BID  . OxyCODONE  40 mg Oral Q12H  . pantoprazole  40 mg Oral Daily  . sodium chloride  10-40 mL Intracatheter Q12H   Continuous Infusions:    DOrson Eva DO  [facility] Pager [phone] If 7PM-7AM, please contact night-coverage [email or web address] Password [email or web address] 10/06/2013, 7:15 PM   LOS: 7 days

## 2013-10-06 NOTE — Progress Notes (Signed)
Patient doing well. Wound VAC in place. Apparently going to rehabilitation at [facility]. I am available for ongoing outpatient care for his wound should it be needed. He will need 4 weeks of IV vancomycin.

## 2013-10-06 NOTE — Anesthesia Postprocedure Evaluation (Signed)
  Anesthesia Post-op Note  Patient: Christopher Raymond  Procedure(s) Performed: Procedure(s): INCISION AND DEBRIDEMENT LEFT FOOT (Left)  Patient Location: room 335  Anesthesia Type:General  Level of Consciousness: awake, alert , oriented and patient cooperative  Airway and Oxygen Therapy: Patient Spontanous Breathing  Post-op Pain: none  Post-op Assessment: Post-op Vital signs reviewed, Patient's Cardiovascular Status Stable, Respiratory Function Stable, Patent Airway, No signs of Nausea or vomiting and Pain level controlled  Post-op Vital Signs: Reviewed and stable  Last Vitals:  Filed Vitals:   10/06/13 0913  BP:   Pulse: 78  Temp:   Resp:     Complications: No apparent anesthesia complications

## 2013-10-07 LAB — GLUCOSE, CAPILLARY
GLUCOSE-CAPILLARY: 194 mg/dL — AB (ref 70–99)
GLUCOSE-CAPILLARY: 220 mg/dL — AB (ref 70–99)
Glucose-Capillary: 120 mg/dL — ABNORMAL HIGH (ref 70–99)
Glucose-Capillary: 127 mg/dL — ABNORMAL HIGH (ref 70–99)

## 2013-10-07 MED ORDER — INSULIN GLARGINE 100 UNIT/ML ~~LOC~~ SOLN
10.0000 [IU] | Freq: Every day | SUBCUTANEOUS | Status: DC
  Filled 2013-10-07 (×4): qty 0.1

## 2013-10-07 NOTE — Progress Notes (Signed)
Inpatient Diabetes Program Recommendations  AACE/ADA: New Consensus Statement on Inpatient Glycemic Control (2013)  Target Ranges:  Prepandial:   less than 140 mg/dL      Peak postprandial:   less than 180 mg/dL (1-2 hours)      Critically ill patients:  140 - 180 mg/dL   Results for Christopher Raymond, Christopher Raymond (MRN [id number]) as of 10/07/2013 10:49  Ref. Range 10/05/2013 07:33 10/05/2013 11:20 10/05/2013 13:34 10/05/2013 14:09 10/05/2013 16:32 10/05/2013 22:02  Glucose-Capillary Latest Range: 70-99 mg/dL 165 (H) 103 (H) 45 (L) 64 (L) 251 (H) 182 (H)  Results for Christopher Raymond, Christopher Raymond (MRN [id number]) as of 10/07/2013 10:49  Ref. Range 10/06/2013 07:45 10/06/2013 11:36 10/06/2013 15:47 10/06/2013 16:29 10/06/2013 17:03 10/06/2013 20:59 10/07/2013 07:39  Glucose-Capillary Latest Range: 70-99 mg/dL 184 (H) 204 (H) 50 (L) 55 (L) 120 (H) 253 (H) 194 (H)   Diabetes history: DM2  Outpatient Diabetes medications: Novolog 70/30 42 units with breakfast, Novolog 70/30 40 units with supper  Current orders for Inpatient glycemic control: Lantus 60 units daily, Novolog 0-9 units AC, Novolog 0-5 units HS, Novolog 15 units TID with meals  Inpatient Diabetes Program Recommendations Insulin - Basal: Please increase Lantus back to 70 units daily. Insulin - Meal Coverage: Please decrease Novolog meal coverage to 10 units TID with meals.  Note: Noted hypoglycemia on 4/14 due to receiving Novolog meal coverage despite being NPO for surgery.  Also noted hypoglycemia on 4/15 with CBG of 50 mg/dl at 15:47 (pt received Novolog 15 units meal coverage at 11:54 am). Anticipate low blood glucose levels were likely result of Novolog not the basal insulin. Please consider increasing Lantus back up to 70 units daily and decrease Novolog meal coverage to 10 units TID.   Thanks, Barnie Alderman, RN, MSN, CCRN Diabetes Coordinator Inpatient Diabetes Program [phone] (Team Pager) [phone] ([facility] office) [phone] [facility] office)

## 2013-10-07 NOTE — Progress Notes (Signed)
Late entryHypoglycemic Event  CBG: 50  Treatment: 15 GM carbohydrate snack  Symptoms: Shaky  Follow-up CBG: Time: CBG Result:55, 120  Possible Reasons for Event: Unknown  Comments/MD notified:Patient reports eating most of his lunch.  Stated he felt shaky.  Alert and oriented with no other complaints.  Patient able to eat snack and blood glucose rechecked and was 55.  Patient continued to eat snack and CBG recheck was 120.    Verdis Prime  Remember to initiate Hypoglycemia Order Set & complete

## 2013-10-07 NOTE — Clinical Social Work Note (Signed)
CSW updated [facility] on pt. Awaiting culture results. Probable d/c tomorrow per MD.  Benay Pike, [place]

## 2013-10-07 NOTE — Progress Notes (Signed)
PROGRESS NOTE  Christopher Raymond XKG:[id number] DOB: 07/13/1975 DOA: 09/29/2013 PCP: Pcp Not In System  Assessment/Plan: Infected diabetic foot ulcer  -GBS and MSSA--09/29/2013 wound culture  - Diabetes is uncontrolled with last hemoglobin A1c of approximately 11 and 05/2013  - d/c vancomycin day 8 (stopped 10/06/13) -Continue cefazolin IV day 2 (started 10/06/13) - Wound culture: abundant group B strep and MSSA--nonsurgical setting - MRI foot: + osteomyelitis  - s/p I&D 10/05/13-->final culture pending - 4/9/15ESR 60, CRP 16.8  -Repeat CRP, ESR - Tentative stop the antibiotics 11/09/2013  Acute osteomyelitis right fifth metatarsal  -Continue wound care  -Continue IV antibiotics  -Repeat ESR, CRP  Hypertension - Continue lisinopril  - Continue carvedilol  -Stable/improving  Type 2 diabetes, uncontrolled  - Hemoglobin A1c 12.1--09/29/2013  -Continue Lantus 60 units in the morning -Add Lantus 10 units at bedtime   -Sliding scale insulin plus each bedtime insulin  Hyponatremia  -likely due to dehydration and resolved with hydration  Chronic low back pain, on chronic narcotics  - Continue chronic narcotics, oxycontin 23m BID, verified dose with pharmacy, and oxycodone 130mq4h prn  - Continue stool softeners to prevent constipation  GERD, stable, continue Protonix  Anemia of chronic disease/inflammation.  - iron studies consistent inflammation, B12 475, folate pending, TSH 2  - Occult stool negative  - Repeat iron studies after osteo is resolved to ensure there is no underlying iron deficiency  Depression/anxiety,  -stable, continue SSRI   Family Communication:   Pt at beside Disposition Plan:   SNF 10/08/13 if stable       Procedures/Studies: Mr Foot Left W Wo Contrast  09/29/2013   CLINICAL DATA:  Open wound along the lateral aspect of the left foot.  EXAM: MRI OF THE LEFT FOREFOOT WITHOUT AND WITH CONTRAST  TECHNIQUE: Multiplanar, multisequence MR imaging was  performed both before and after administration of intravenous contrast.  CONTRAST:  2060mULTIHANCE GADOBENATE DIMEGLUMINE 529 MG/ML IV SOLN  COMPARISON:  None.  FINDINGS: There is a soft tissue ulcer along the lateral aspect of the left forefoot at the level of the fifth metatarsal head. There is marrow edema within the fifth metatarsal head with corresponding T1 hypointense signal and cortical irregularity along the plantar aspect of the fifth metatarsal head with enhancement. There is a sinus tract extending from the knee plantar surface of the lateral forefoot to the fifth metatarsal head. There is no focal fluid collection to suggest an abscess. There is mild marrow edema within the base of the fifth proximal phalanx without cortical irregularity likely reactive.  The remainder of the osseous structures are normal. No other focal marrow signal abnormality. No joint effusions. The flexor, extensor and peroneal tendons are intact.  IMPRESSION: There is a soft tissue ulcer along the lateral aspect of the left forefoot at the level of the fifth metatarsal head. There is marrow edema within the fifth metatarsal head with corresponding T1 hypointense signal and cortical irregularity along the plantar aspect of the fifth metatarsal head with enhancement. The appearance is concerning for osteomyelitis. There is a sinus tract extending from the skin surface to the fifth metatarsal head.   Electronically Signed   By: HetKathreen DevoidOn: 09/29/2013 13:02   Dg Foot Complete Left  09/29/2013   CLINICAL DATA:  Ulceration at the lateral aspect of the left foot.  EXAM: LEFT FOOT - COMPLETE 3+ VIEW  COMPARISON:  Left foot MRI performed  06/28/2013, and left foot radiographs performed 06/24/2013  FINDINGS: There is no evidence of fracture or dislocation. There is no evidence of osseous erosion. The joint spaces are preserved. There is no evidence of talar subluxation; the subtalar joint is unremarkable in appearance.  Diffuse  soft tissue swelling is noted about the foot. Known soft tissue ulceration is not well characterized on radiograph. No radiopaque foreign bodies are seen.  IMPRESSION: No evidence of fracture or dislocation. No evidence of osseous erosion. No radiopaque foreign bodies seen.   Electronically Signed   By: Garald Balding M.D.   On: 09/29/2013 00:49         Subjective:   Objective: Filed Vitals:   10/07/13 0426 10/07/13 0830 10/07/13 1417 10/07/13 1740  BP: 134/63 132/70 124/72 154/89  Pulse: 72 70 74 79  Temp: 97.7 F (36.5 C) 97.5 F (36.4 C) 97.8 F (36.6 C) 97.2 F (36.2 C)  TempSrc: Oral Oral Oral Axillary  Resp: 20 20 20 20   Height:      Weight:      SpO2: 100% 100% 98% 100%    Intake/Output Summary (Last 24 hours) at 10/07/13 1803 Last data filed at 10/07/13 1700  Gross per 24 hour  Intake   1060 ml  Output   5626 ml  Net  -4566 ml   Weight change:  Exam:   General:  Pt is alert, follows commands appropriately, not in acute distress  HEENT: No icterus, No thrush, No neck mass, [place]/AT  Cardiovascular: RRR, S1/S2, no rubs, no gallops  Respiratory: CTA bilaterally, no wheezing, no crackles, no rhonchi  Abdomen: Soft/+BS, non tender, non distended, no guarding  Extremities: No edema, No lymphangitis, No petechiae, No rashes, no synovitis  Data Reviewed: Basic Metabolic Panel:  Recent Labs Lab 10/01/13 0440 10/02/13 0637 10/03/13 0639 10/04/13 0834 10/06/13 0834  NA 135* 137 137 137 137  K 3.5* 3.9 3.4* 3.9 3.9  CL 97 98 96 97 97  CO2 29 30 31 29 31   GLUCOSE 199* 225* 223* 186* 197*  BUN 6 5* 4* 6 8  CREATININE 0.64 0.66 0.67 0.55 0.59  CALCIUM 8.5 8.6 8.6 8.4 8.5   Liver Function Tests: No results found for this basename: AST, ALT, ALKPHOS, BILITOT, PROT, ALBUMIN,  in the last 168 hours No results found for this basename: LIPASE, AMYLASE,  in the last 168 hours No results found for this basename: AMMONIA,  in the last 168 hours CBC:  Recent  Labs Lab 10/01/13 0440 10/02/13 0637 10/03/13 0639  WBC 5.7 5.0 4.7  HGB 10.5* 11.0* 10.8*  HCT 30.9* 32.3* 31.7*  MCV 80.9 81.0 81.1  PLT 264 289 286   Cardiac Enzymes: No results found for this basename: CKTOTAL, CKMB, CKMBINDEX, TROPONINI,  in the last 168 hours BNP: No components found with this basename: POCBNP,  CBG:  Recent Labs Lab 10/06/13 1703 10/06/13 2059 10/07/13 0739 10/07/13 1126 10/07/13 1719  GLUCAP 120* 253* 194* 220* 120*    Recent Results (from the past 240 hour(s))  WOUND CULTURE     Status: None   Collection Time    09/29/13  9:30 AM      Result Value Ref Range Status   Specimen Description FOOT   Final   Special Requests Immunocompromised   Final   Gram Stain     Final   Value: NO WBC SEEN     NO SQUAMOUS EPITHELIAL CELLS SEEN     ABUNDANT GRAM NEGATIVE RODS  ABUNDANT GRAM POSITIVE COCCI     IN PAIRS IN CLUSTERS   Culture     Final   Value: FEW STAPHYLOCOCCUS AUREUS     ABUNDANT GROUP B STREP(S.AGALACTIAE)ISOLATED     Note: RIFAMPIN AND GENTAMICIN SHOULD NOT BE USED AS SINGLE DRUGS FOR TREATMENT OF STAPH INFECTIONS.     Performed at [facility]   Report Status 10/03/2013 FINAL   Final   Organism ID, Bacteria STAPHYLOCOCCUS AUREUS   Final  WOUND CULTURE     Status: None   Collection Time    10/05/13  1:00 PM      Result Value Ref Range Status   Specimen Description WOUND LEFT 5TH TOE   Final   Special Requests VANCOMYCIN 2079m   Final   Gram Stain     Final   Value: ABUNDANT WBC PRESENT,BOTH PMN AND MONONUCLEAR     NO SQUAMOUS EPITHELIAL CELLS SEEN     MODERATE GRAM NEGATIVE RODS     Performed at [facility]  Culture     Final   Value: RARE GROUP B STREP(S.AGALACTIAE)ISOLATED     Note: TESTING AGAINST S. AGALACTIAE NOT ROUTINELY PERFORMED DUE TO PREDICTABILITY OF AMP/PEN/VAN SUSCEPTIBILITY.     Performed at [facility]  Report Status PENDING   Incomplete     Scheduled Meds: . carvedilol  25 mg  Oral BID WC  .  ceFAZolin (ANCEF) IV  2 g Intravenous 3 times per day  . docusate sodium  100 mg Oral BID  . escitalopram  10 mg Oral Daily  . feeding supplement (PRO-STAT SUGAR FREE 64)  30 mL Oral TID WC  . insulin aspart  0-9 Units Subcutaneous TID WC  . insulin aspart  15 Units Subcutaneous TID WC  . insulin glargine  60 Units Subcutaneous Daily  . lisinopril  40 mg Oral Daily  . mupirocin cream   Topical BID  . OxyCODONE  40 mg Oral Q12H  . pantoprazole  40 mg Oral Daily  . sodium chloride  10-40 mL Intracatheter Q12H   Continuous Infusions:    DOrson Eva DO  [facility] Pager [phone] If 7PM-7AM, please contact night-coverage [email or web address] Password [email or web address] 10/07/2013, 6:03 PM   LOS: 8 days

## 2013-10-08 LAB — BASIC METABOLIC PANEL
BUN: 11 mg/dL (ref 6–23)
CO2: 35 mEq/L — ABNORMAL HIGH (ref 19–32)
Calcium: 9 mg/dL (ref 8.4–10.5)
Chloride: 97 mEq/L (ref 96–112)
Creatinine, Ser: 0.66 mg/dL (ref 0.50–1.35)
GFR calc Af Amer: >90 mL/min (ref >90)
GFR calc non Af Amer: >90 mL/min (ref >90)
Glucose, Bld: 102 mg/dL — ABNORMAL HIGH (ref 70–99)
Potassium: 3.8 mEq/L (ref 3.7–5.3)
Sodium: 140 mEq/L (ref 137–147)

## 2013-10-08 LAB — CBC
HCT: 32.1 % — ABNORMAL LOW (ref 39.0–52.0)
Hemoglobin: 10.6 g/dL — ABNORMAL LOW (ref 13.0–17.0)
MCH: 27.2 pg (ref 26.0–34.0)
MCHC: 33 g/dL (ref 30.0–36.0)
MCV: 82.3 fL (ref 78.0–100.0)
Platelets: 368 10*3/uL (ref 150–400)
RBC: 3.9 MIL/uL — ABNORMAL LOW (ref 4.22–5.81)
RDW: 12.8 % (ref 11.5–15.5)
WBC: 4.3 10*3/uL (ref 4.0–10.5)

## 2013-10-08 LAB — WOUND CULTURE

## 2013-10-08 LAB — C-REACTIVE PROTEIN: CRP: 1.6 mg/dL — ABNORMAL HIGH (ref <0.60)

## 2013-10-08 LAB — GLUCOSE, CAPILLARY
GLUCOSE-CAPILLARY: 50 mg/dL — AB (ref 70–99)
Glucose-Capillary: 121 mg/dL — ABNORMAL HIGH (ref 70–99)
Glucose-Capillary: 134 mg/dL — ABNORMAL HIGH (ref 70–99)

## 2013-10-08 LAB — SEDIMENTATION RATE: SED RATE: 45 mm/h — AB (ref 0–16)

## 2013-10-08 MED ORDER — CEFAZOLIN SODIUM-DEXTROSE 2-3 GM-% IV SOLR: 2.0000 g | Freq: Three times a day (TID) | INTRAVENOUS | Status: DC

## 2013-10-08 MED ORDER — INSULIN GLARGINE 100 UNIT/ML ~~LOC~~ SOLN: 60.0000 [IU] | Freq: Every day | SUBCUTANEOUS | Status: DC

## 2013-10-08 MED ORDER — INSULIN ASPART 100 UNIT/ML ~~LOC~~ SOLN: 15.0000 [IU] | Freq: Three times a day (TID) | SUBCUTANEOUS | Status: DC

## 2013-10-08 MED ORDER — LISINOPRIL 40 MG PO TABS: 40.0000 mg | ORAL_TABLET | Freq: Every day | ORAL | Status: DC

## 2013-10-08 MED ORDER — DSS 100 MG PO CAPS
100.0000 mg | ORAL_CAPSULE | Freq: Two times a day (BID) | ORAL | Status: DC
Start: 2013-10-08 — End: 2013-11-24

## 2013-10-08 MED ORDER — OXYCODONE HCL ER 40 MG PO T12A
40.0000 mg | EXTENDED_RELEASE_TABLET | Freq: Two times a day (BID) | ORAL | Status: DC
Start: 2013-10-08 — End: 2013-12-05

## 2013-10-08 MED ORDER — INSULIN GLARGINE 100 UNIT/ML ~~LOC~~ SOLN: 10.0000 [IU] | Freq: Every day | SUBCUTANEOUS | Status: DC

## 2013-10-08 MED ORDER — OXYCODONE HCL 15 MG PO TABS: 15.0000 mg | ORAL_TABLET | ORAL | Status: DC | PRN

## 2013-10-08 MED ORDER — SENNA 8.6 MG PO TABS: 2.0000 | ORAL_TABLET | Freq: Every evening | ORAL | Status: DC | PRN

## 2013-10-08 MED ORDER — CARVEDILOL 25 MG PO TABS: 25.0000 mg | ORAL_TABLET | Freq: Two times a day (BID) | ORAL | Status: DC

## 2013-10-08 NOTE — Progress Notes (Signed)
Report call to Zadie Cleverly at [place]. They are aware wound vac will need to be placed when he gets there. Awaiting for ride to transport.

## 2013-10-08 NOTE — Progress Notes (Signed)
Patient c/o feeling weak and clammy. Blood sugar 50. Patient eating crackers and drinking juice. Dr. Carles Collet paged to notify.

## 2013-10-08 NOTE — Discharge Summary (Addendum)
Physician Discharge Summary  Christopher Raymond PFX:[id number] DOB: 11-30-1975 DOA: 09/29/2013  PCP: Pcp Not In System  Admit date: 09/29/2013 Discharge date: 10/08/2013  Recommendations for Outpatient Follow-up:  1. Pt will need to follow up with PCP in 2 weeks post discharge 2. Please obtain BMP to evaluate electrolytes and kidney function 3. Please also check CBC to evaluate Hg and Hct levels 4. Continue cefazolin 2 g IV every 8 hours with the last dose on 11/08/2013 5. Remove PICC line after last dose of cefazolin Discharge Diagnoses:  Principal Problem:   Diabetic osteomyelitis Active Problems:   GERD (gastroesophageal reflux disease)   Essential hypertension, benign   Diabetes   Chronic low back pain   Cellulitis   Diabetic foot infection   Diabetic foot ulcer   Hyponatremia   Hyperglycemia   Acute osteomyelitis of foot Infected diabetic foot ulcer  -GBS and MSSA--09/29/2013 wound culture  - Diabetes is uncontrolled with last hemoglobin A1c of approximately 11 and 05/2013  - d/c vancomycin day 8 (stopped 10/06/13)  -Continue cefazolin IV day 2 (started 10/06/13)  - Wound culture: abundant group B strep and MSSA--nonsurgical setting  - MRI foot: + osteomyelitis  - s/p I&D 10/05/13-->surgical cultures revealed GBS - 4/9/15ESR 60, CRP 16.8  -Repeat CRP-pending at the time of discharge -ESR--45 -  stop antibiotics and remove PICC line after last dose on 11/08/2013 -This will complete approximately 6 weeks of intravenous antibiotics -Change wound VAC dressings on Monday, Wednesday, Friday -Dr. Arnoldo Morale will continue to follow pt Acute osteomyelitis right fifth metatarsal  -Continue wound care  -Continue IV antibiotics as discussed above -Repeat ESR, CRP--see above   Hypertension  - Continue lisinopril  - Continue carvedilol  -Stable/improving  Type 2 diabetes, uncontrolled  - Hemoglobin A1c 12.1--09/29/2013  -Continue Lantus 60 units in the morning  -Add Lantus 10 units at  bedtime  -Pt had some episodes of hypoglycemia--likely due to novolog 15 units with each meal--this will be discontinued -The patient will continue to need CBGs before each meal and at bedtime and have his insulin regimen adjusted. I suspect the patient has a degree of noncompliance prior to his admission to the hospital. Hyponatremia  -likely due to dehydration and resolved with hydration  Chronic low back pain, on chronic narcotics  - Continue chronic narcotics, oxycontin 55m BID, verified dose with pharmacy, and oxycodone 178mq4h prn  - Continue stool softeners to prevent constipation  GERD, stable, continue Protonix  Anemia of chronic disease/inflammation.  - iron studies consistent inflammation, B12 475, folate pending, TSH 2  - Occult stool negative  - Repeat iron studies after osteo is resolved to ensure there is no underlying iron deficiency  Depression/anxiety,  -stable, continue SSRI   Discharge Condition: stable  Disposition: SNF  Diet:carb modified Wt Readings from Last 3 Encounters:  10/05/13 122.999 kg (271 lb 2.6 oz)  10/05/13 122.999 kg (271 lb 2.6 oz)  07/11/13 131.316 kg (289 lb 8 oz)    History of present illness:  [age].o. male with a history of DM2 on Insulin Rx who presents to the ED with complaints of increased redness, pain and foul smelling drainage from his left foot wound. The redness has been spreading up his left leg x 1 day. He reports havingn fevers and chills for the past 24 hours as well. He reports that he has had the wound on his left foot since 05/2013 and has been undergoing wound care by a Dr. [place]Constance Hawn [place]. MRI  of his foot on 09/29/2013 revealed edema of his fifth metatarsal suggestive of acute osteomyelitis.  The patient was apparently started on broad-spectrum antibiotics. Dr. Arnoldo Morale was consulted to see the patient. Initial wound culture grew group B Streptococcus as well as MSSA. His antibiotic spectrum was narrowed to vancomycin.  The patient's wound was slow to progress. The patient was taken to surgery on 10/05/2013 for debridement. Surgical cultures grew group B streptococcus. The patient was switched to cefazolin. Repeat ESR reveals improvement from 60 to 45. A PICC line was placed for intravenous antibiotics. A wound VAC was placed. The patient will be discharged to skilled nursing facility for continued wound care and intravenous antibiotics.     Consultants: Dr. Arnoldo Morale Ortho--Dr. Aline Brochure  Discharge Exam: Filed Vitals:   10/08/13 1502  BP:   Pulse: 71  Temp: 98.1 F (36.7 C)  Resp: 20   Filed Vitals:   10/07/13 2120 10/08/13 0012 10/08/13 0533 10/08/13 1502  BP: 170/88 164/95 148/89   Pulse: 79 75 70 71  Temp: 97.5 F (36.4 C) 98 F (36.7 C) 98.2 F (36.8 C) 98.1 F (36.7 C)  TempSrc: Axillary Oral Oral Oral  Resp: 20 20 20 20   Height:      Weight:      SpO2: 100% 100% 98% 98%   General: A&O x 3, NAD, pleasant, cooperative Cardiovascular: RRR, no rub, no gallop, no S3 Respiratory: CTAB, no wheeze, no rhonchi Abdomen:soft, nontender, nondistended, positive bowel sounds Extremities: 1+LE edema-wound VAC in place on the left dorsal foot without any crepitance, lymphangitis, necrosis  Discharge Instructions      Discharge Orders   Future Orders Complete By Expires   Diet - low sodium heart healthy  As directed    Increase activity slowly  As directed        Medication List    STOP taking these medications       insulin aspart protamine- aspart (70-30) 100 UNIT/ML injection  Commonly known as:  NOVOLOG MIX 70/30      TAKE these medications       carvedilol 25 MG tablet  Commonly known as:  COREG  Take 1 tablet (25 mg total) by mouth 2 (two) times daily with a meal.     ceFAZolin 2-3 GM-% Solr  Commonly known as:  ANCEF  Inject 50 mLs (2 g total) into the vein every 8 (eight) hours. Last dose to be given on 11/08/13     DSS 100 MG Caps  Take 100 mg by mouth 2 (two) times  daily.     escitalopram 10 MG tablet  Commonly known as:  LEXAPRO  Take 1 tablet (10 mg total) by mouth daily.     esomeprazole 40 MG capsule  Commonly known as:  NEXIUM  Take 1 capsule (40 mg total) by mouth daily before breakfast.     insulin glargine 100 UNIT/ML injection  Commonly known as:  LANTUS  Inject 0.1 mLs (10 Units total) into the skin at bedtime.     insulin glargine 100 UNIT/ML injection  Commonly known as:  LANTUS  Inject 0.6 mLs (60 Units total) into the skin daily.     lisinopril 40 MG tablet  Commonly known as:  PRINIVIL,ZESTRIL  Take 1 tablet (40 mg total) by mouth daily.     OxyCODONE 40 mg T12a 12 hr tablet  Commonly known as:  OXYCONTIN  Take 1 tablet (40 mg total) by mouth every 12 (twelve) hours.     oxyCODONE  15 MG immediate release tablet  Commonly known as:  ROXICODONE  Take 1 tablet (15 mg total) by mouth every 4 (four) hours as needed for pain (Break Through Justus Memory).     senna 8.6 MG Tabs tablet  Commonly known as:  SENOKOT  Take 2 tablets (17.2 mg total) by mouth at bedtime as needed for mild constipation.         The results of significant diagnostics from this hospitalization (including imaging, microbiology, ancillary and laboratory) are listed below for reference.    Significant Diagnostic Studies: Mr Foot Left W Wo Contrast  09/29/2013   CLINICAL DATA:  Open wound along the lateral aspect of the left foot.  EXAM: MRI OF THE LEFT FOREFOOT WITHOUT AND WITH CONTRAST  TECHNIQUE: Multiplanar, multisequence MR imaging was performed both before and after administration of intravenous contrast.  CONTRAST:  67m MULTIHANCE GADOBENATE DIMEGLUMINE 529 MG/ML IV SOLN  COMPARISON:  None.  FINDINGS: There is a soft tissue ulcer along the lateral aspect of the left forefoot at the level of the fifth metatarsal head. There is marrow edema within the fifth metatarsal head with corresponding T1 hypointense signal and cortical irregularity along the plantar  aspect of the fifth metatarsal head with enhancement. There is a sinus tract extending from the knee plantar surface of the lateral forefoot to the fifth metatarsal head. There is no focal fluid collection to suggest an abscess. There is mild marrow edema within the base of the fifth proximal phalanx without cortical irregularity likely reactive.  The remainder of the osseous structures are normal. No other focal marrow signal abnormality. No joint effusions. The flexor, extensor and peroneal tendons are intact.  IMPRESSION: There is a soft tissue ulcer along the lateral aspect of the left forefoot at the level of the fifth metatarsal head. There is marrow edema within the fifth metatarsal head with corresponding T1 hypointense signal and cortical irregularity along the plantar aspect of the fifth metatarsal head with enhancement. The appearance is concerning for osteomyelitis. There is a sinus tract extending from the skin surface to the fifth metatarsal head.   Electronically Signed   By: HKathreen Devoid  On: 09/29/2013 13:02   Dg Foot Complete Left  09/29/2013   CLINICAL DATA:  Ulceration at the lateral aspect of the left foot.  EXAM: LEFT FOOT - COMPLETE 3+ VIEW  COMPARISON:  Left foot MRI performed 06/28/2013, and left foot radiographs performed 06/24/2013  FINDINGS: There is no evidence of fracture or dislocation. There is no evidence of osseous erosion. The joint spaces are preserved. There is no evidence of talar subluxation; the subtalar joint is unremarkable in appearance.  Diffuse soft tissue swelling is noted about the foot. Known soft tissue ulceration is not well characterized on radiograph. No radiopaque foreign bodies are seen.  IMPRESSION: No evidence of fracture or dislocation. No evidence of osseous erosion. No radiopaque foreign bodies seen.   Electronically Signed   By: JGarald BaldingM.D.   On: 09/29/2013 00:49     Microbiology: Recent Results (from the past 240 hour(s))  WOUND CULTURE      Status: None   Collection Time    09/29/13  9:30 AM      Result Value Ref Range Status   Specimen Description FOOT   Final   Special Requests Immunocompromised   Final   Gram Stain     Final   Value: NO WBC SEEN     NO SQUAMOUS EPITHELIAL CELLS SEEN  ABUNDANT GRAM NEGATIVE RODS     ABUNDANT GRAM POSITIVE COCCI     IN PAIRS IN CLUSTERS   Culture     Final   Value: FEW STAPHYLOCOCCUS AUREUS     ABUNDANT GROUP B STREP(S.AGALACTIAE)ISOLATED     Note: RIFAMPIN AND GENTAMICIN SHOULD NOT BE USED AS SINGLE DRUGS FOR TREATMENT OF STAPH INFECTIONS.     Performed at [facility]   Report Status 10/03/2013 FINAL   Final   Organism ID, Bacteria STAPHYLOCOCCUS AUREUS   Final  WOUND CULTURE     Status: None   Collection Time    10/05/13  1:00 PM      Result Value Ref Range Status   Specimen Description WOUND LEFT 5TH TOE   Final   Special Requests VANCOMYCIN 2015m   Final   Gram Stain     Final   Value: ABUNDANT WBC PRESENT,BOTH PMN AND MONONUCLEAR     NO SQUAMOUS EPITHELIAL CELLS SEEN     MODERATE GRAM NEGATIVE RODS     Performed at [facility]  Culture     Final   Value: RARE GROUP B STREP(S.AGALACTIAE)ISOLATED     Note: TESTING AGAINST S. AGALACTIAE NOT ROUTINELY PERFORMED DUE TO PREDICTABILITY OF AMP/PEN/VAN SUSCEPTIBILITY.     [facility]formed at [facility]  Report Status 10/08/2013 FINAL   Final     Labs: Basic Metabolic Panel:  Recent Labs Lab 10/02/13 0637 10/03/13 0639 10/04/13 0834 10/06/13 0834 10/08/13 0621  NA 137 137 137 137 140  K 3.9 3.4* 3.9 3.9 3.8  CL 98 96 97 97 97  CO2 30 31 29 31  35*  GLUCOSE 225* 223* 186* 197* 102*  BUN 5* 4* 6 8 11   CREATININE 0.66 0.67 0.55 0.59 0.66  CALCIUM 8.6 8.6 8.4 8.5 9.0   Liver Function Tests: No results found for this basename: AST, ALT, ALKPHOS, BILITOT, PROT, ALBUMIN,  in the last 168 hours No results found for this basename: LIPASE, AMYLASE,  in the last 168 hours No results found for this  basename: AMMONIA,  in the last 168 hours CBC:  Recent Labs Lab 10/02/13 0637 10/03/13 0639 10/08/13 0621  WBC 5.0 4.7 4.3  HGB 11.0* 10.8* 10.6*  HCT 32.3* 31.7* 32.1*  MCV 81.0 81.1 82.3  PLT 289 286 368   Cardiac Enzymes: No results found for this basename: CKTOTAL, CKMB, CKMBINDEX, TROPONINI,  in the last 168 hours BNP: No components found with this basename: POCBNP,  CBG:  Recent Labs Lab 10/07/13 1126 10/07/13 1719 10/07/13 2117 10/08/13 1123 10/08/13 1501  GLUCAP 220* 120* 127* 121* 50*    Time coordinating discharge:  Greater than 30 minutes  Signed:  DOrson Eva DO [facility] Pager: [phone]/17/2015, 3:45 PM

## 2013-10-08 NOTE — Clinical Social Work Note (Signed)
Patient ready for discharge today, will transfer to [facility] w friend transporting approx 5 PM.  Debbie from [place] will come to [facility] to do his admission paperwork.  Facility informed that he will need wound vac placed when he arrives at their facility, also that PICC line was changed today per RN CM.  Discharge summary faxed via [facility].  Discharge packet prepared and placed w shadow chart for transport.  Facility and patient both agreeable to transfer.  CSW signing off as no further SW needs identified. Edwyna Shell, LCSW Clinical Social Worker [phone])

## 2013-10-08 NOTE — Progress Notes (Signed)
Wound Vac removed from left foot. Wet to Dry dressing applied. Patient ready for tranport to [facility]

## 2013-10-08 NOTE — Clinical Social Work Placement (Signed)
    Clinical Social Work Department CLINICAL SOCIAL WORK PLACEMENT NOTE 10/08/2013  Patient:  Christopher Raymond, Christopher Raymond  Account Number:  [id number] Admit date:  09/28/2013  Clinical Social Worker:  Beverly Sessions  Date/time:  10/04/2013 12:44 PM  Clinical Social Work is seeking post-discharge placement for this patient at the following level of care:   [place]   (*CSW will update this form in Epic as items are completed)   10/04/2013  Patient/family provided with [place] Department of Clinical Social Work's list of facilities offering this level of care within the geographic area requested by the patient (or if unable, by the patient's family).  10/04/2013  Patient/family informed of their freedom to choose among providers that offer the needed level of care, that participate in Medicare, Medicaid or managed care program needed by the patient, have an available bed and are willing to accept the patient.  10/04/2013  Patient/family informed of [facility]' ownership interest in [facility], as well as of the fact that they are under no obligation to receive care at this facility.  PASARR submitted to E[facility] on 10/04/2013 PASARR number received from [facility] on 10/04/2013  FL2 transmitted to all facilities in geographic area requested by pt/family on  10/04/2013 FL2 transmitted to all facilities within larger geographic area on   Patient informed that his/her managed care company has contracts with or will negotiate with  certain facilities, including the following:     Patient/family informed of bed offers received:  10/05/2013 Patient chooses bed at [facility] Physician recommends and patient chooses bed at  [facility]  Patient to be transferred to [facility] on  10/08/2013 Patient to be transferred to facility by Family/friend transport  The following physician request were entered in Epic:   Additional Comments:  Edwyna Shell,  LCSW Clinical Social Worker [phone])

## 2013-11-09 ENCOUNTER — Ambulatory Visit: Payer: Medicare Other | Admitting: Internal Medicine

## 2013-11-24 ENCOUNTER — Emergency Department (HOSPITAL_COMMUNITY): Payer: Medicare Other

## 2013-11-24 ENCOUNTER — Inpatient Hospital Stay (HOSPITAL_COMMUNITY)
Admission: EM | Admit: 2013-11-24 | Discharge: 2013-11-26 | DRG: 603 | Disposition: A | Discharge status: Home Health | Payer: Medicare Other | Attending: Internal Medicine | Admitting: Internal Medicine

## 2013-11-24 ENCOUNTER — Encounter (HOSPITAL_COMMUNITY): Payer: Self-pay | Admitting: Emergency Medicine

## 2013-11-24 DIAGNOSIS — E871 Hypo-osmolality and hyponatremia: Secondary | ICD-10-CM | POA: Diagnosis present

## 2013-11-24 DIAGNOSIS — I10 Essential (primary) hypertension: Secondary | ICD-10-CM

## 2013-11-24 DIAGNOSIS — L02619 Cutaneous abscess of unspecified foot: Principal | ICD-10-CM | POA: Diagnosis present

## 2013-11-24 DIAGNOSIS — K219 Gastro-esophageal reflux disease without esophagitis: Secondary | ICD-10-CM | POA: Diagnosis present

## 2013-11-24 DIAGNOSIS — F329 Major depressive disorder, single episode, unspecified: Secondary | ICD-10-CM | POA: Diagnosis present

## 2013-11-24 DIAGNOSIS — M9080 Osteopathy in diseases classified elsewhere, unspecified site: Secondary | ICD-10-CM

## 2013-11-24 DIAGNOSIS — M86179 Other acute osteomyelitis, unspecified ankle and foot: Secondary | ICD-10-CM

## 2013-11-24 DIAGNOSIS — L97509 Non-pressure chronic ulcer of other part of unspecified foot with unspecified severity: Secondary | ICD-10-CM | POA: Diagnosis present

## 2013-11-24 DIAGNOSIS — IMO0002 Reserved for concepts with insufficient information to code with codable children: Secondary | ICD-10-CM | POA: Diagnosis present

## 2013-11-24 DIAGNOSIS — M869 Osteomyelitis, unspecified: Secondary | ICD-10-CM

## 2013-11-24 DIAGNOSIS — E11621 Type 2 diabetes mellitus with foot ulcer: Secondary | ICD-10-CM | POA: Diagnosis present

## 2013-11-24 DIAGNOSIS — M009 Pyogenic arthritis, unspecified: Secondary | ICD-10-CM | POA: Diagnosis present

## 2013-11-24 DIAGNOSIS — L089 Local infection of the skin and subcutaneous tissue, unspecified: Secondary | ICD-10-CM

## 2013-11-24 DIAGNOSIS — L0390 Cellulitis, unspecified: Secondary | ICD-10-CM

## 2013-11-24 DIAGNOSIS — E11628 Type 2 diabetes mellitus with other skin complications: Secondary | ICD-10-CM | POA: Diagnosis present

## 2013-11-24 DIAGNOSIS — E1169 Type 2 diabetes mellitus with other specified complication: Secondary | ICD-10-CM

## 2013-11-24 DIAGNOSIS — G8929 Other chronic pain: Secondary | ICD-10-CM | POA: Diagnosis present

## 2013-11-24 DIAGNOSIS — Z881 Allergy status to other antibiotic agents status: Secondary | ICD-10-CM

## 2013-11-24 DIAGNOSIS — F17200 Nicotine dependence, unspecified, uncomplicated: Secondary | ICD-10-CM | POA: Diagnosis present

## 2013-11-24 DIAGNOSIS — E119 Type 2 diabetes mellitus without complications: Secondary | ICD-10-CM | POA: Diagnosis present

## 2013-11-24 DIAGNOSIS — E1165 Type 2 diabetes mellitus with hyperglycemia: Secondary | ICD-10-CM | POA: Diagnosis present

## 2013-11-24 DIAGNOSIS — Z8249 Family history of ischemic heart disease and other diseases of the circulatory system: Secondary | ICD-10-CM

## 2013-11-24 DIAGNOSIS — F3289 Other specified depressive episodes: Secondary | ICD-10-CM | POA: Diagnosis present

## 2013-11-24 DIAGNOSIS — L03119 Cellulitis of unspecified part of limb: Principal | ICD-10-CM

## 2013-11-24 DIAGNOSIS — Z794 Long term (current) use of insulin: Secondary | ICD-10-CM

## 2013-11-24 LAB — BASIC METABOLIC PANEL
BUN: 11 mg/dL (ref 6–23)
CHLORIDE: 94 meq/L — AB (ref 96–112)
CO2: 28 meq/L (ref 19–32)
CREATININE: 0.64 mg/dL (ref 0.50–1.35)
Calcium: 8.5 mg/dL (ref 8.4–10.5)
GFR calc Af Amer: >90 mL/min (ref >90)
GFR calc non Af Amer: >90 mL/min (ref >90)
GLUCOSE: 255 mg/dL — AB (ref 70–99)
Potassium: 4.2 mEq/L (ref 3.7–5.3)
Sodium: 133 mEq/L — ABNORMAL LOW (ref 137–147)

## 2013-11-24 LAB — CBC WITH DIFFERENTIAL/PLATELET
Basophils Absolute: 0 10*3/uL (ref 0.0–0.1)
Basophils Relative: 0 % (ref 0–1)
Eosinophils Absolute: 0.2 10*3/uL (ref 0.0–0.7)
Eosinophils Relative: 4 % (ref 0–5)
HCT: 30.9 % — ABNORMAL LOW (ref 39.0–52.0)
HEMOGLOBIN: 10.5 g/dL — AB (ref 13.0–17.0)
LYMPHS ABS: 1.1 10*3/uL (ref 0.7–4.0)
LYMPHS PCT: 22 % (ref 12–46)
MCH: 27.4 pg (ref 26.0–34.0)
MCHC: 34 g/dL (ref 30.0–36.0)
MCV: 80.7 fL (ref 78.0–100.0)
MONO ABS: 0.4 10*3/uL (ref 0.1–1.0)
Monocytes Relative: 8 % (ref 3–12)
Neutro Abs: 3.2 10*3/uL (ref 1.7–7.7)
Neutrophils Relative %: 66 % (ref 43–77)
Platelets: 217 10*3/uL (ref 150–400)
RBC: 3.83 MIL/uL — ABNORMAL LOW (ref 4.22–5.81)
RDW: 13.4 % (ref 11.5–15.5)
WBC: 4.9 10*3/uL (ref 4.0–10.5)

## 2013-11-24 LAB — CBG MONITORING, ED: Glucose-Capillary: 322 mg/dL — ABNORMAL HIGH (ref 70–99)

## 2013-11-24 LAB — GLUCOSE, CAPILLARY: Glucose-Capillary: 283 mg/dL — ABNORMAL HIGH (ref 70–99)

## 2013-11-24 MED ORDER — PIPERACILLIN-TAZOBACTAM 3.375 G IVPB
INTRAVENOUS | Status: AC
  Filled 2013-11-24: qty 100

## 2013-11-24 MED ORDER — PANTOPRAZOLE SODIUM 40 MG PO TBEC
80.0000 mg | DELAYED_RELEASE_TABLET | Freq: Every day | ORAL | Status: DC
Start: 2013-11-25 — End: 2013-11-26
  Administered 2013-11-25 – 2013-11-26 (×2): 80 mg via ORAL
  Filled 2013-11-24 (×2): qty 2

## 2013-11-24 MED ORDER — VANCOMYCIN HCL 10 G IV SOLR
1500.0000 mg | Freq: Once | INTRAVENOUS | Status: AC
  Administered 2013-11-24: 1500 mg via INTRAVENOUS
  Filled 2013-11-24: qty 1500

## 2013-11-24 MED ORDER — ADULT MULTIVITAMIN W/MINERALS CH
1.0000 | ORAL_TABLET | Freq: Every day | ORAL | Status: DC
  Administered 2013-11-25 – 2013-11-26 (×2): 1 via ORAL
  Filled 2013-11-24 (×2): qty 1

## 2013-11-24 MED ORDER — PIPERACILLIN-TAZOBACTAM 3.375 G IVPB
3.3750 g | Freq: Three times a day (TID) | INTRAVENOUS | Status: DC
  Administered 2013-11-25 – 2013-11-26 (×6): 3.375 g via INTRAVENOUS
  Filled 2013-11-24 (×15): qty 50

## 2013-11-24 MED ORDER — BISACODYL 10 MG RE SUPP: 10.0000 mg | Freq: Every day | RECTAL | Status: DC | PRN

## 2013-11-24 MED ORDER — INSULIN ASPART PROT & ASPART (70-30 MIX) 100 UNIT/ML ~~LOC~~ SUSP
40.0000 [IU] | Freq: Every day | SUBCUTANEOUS | Status: DC
  Administered 2013-11-25 – 2013-11-26 (×2): 40 [IU] via SUBCUTANEOUS
  Filled 2013-11-24: qty 10

## 2013-11-24 MED ORDER — OXYCODONE HCL ER 20 MG PO T12A
40.0000 mg | EXTENDED_RELEASE_TABLET | Freq: Two times a day (BID) | ORAL | Status: DC
  Administered 2013-11-24 – 2013-11-26 (×4): 40 mg via ORAL
  Filled 2013-11-24 (×4): qty 2

## 2013-11-24 MED ORDER — OXYCODONE HCL 5 MG PO TABS
15.0000 mg | ORAL_TABLET | ORAL | Status: DC | PRN
  Administered 2013-11-24: 15 mg via ORAL
  Filled 2013-11-24: qty 3

## 2013-11-24 MED ORDER — ONDANSETRON HCL 4 MG/2ML IJ SOLN: 4.0000 mg | Freq: Four times a day (QID) | INTRAMUSCULAR | Status: DC | PRN

## 2013-11-24 MED ORDER — SODIUM CHLORIDE 0.9 % IV SOLN
INTRAVENOUS | Status: DC
  Administered 2013-11-25: 18:00:00 via INTRAVENOUS

## 2013-11-24 MED ORDER — INSULIN GLARGINE 100 UNIT/ML ~~LOC~~ SOLN: 10.0000 [IU] | Freq: Every day | SUBCUTANEOUS | Status: DC

## 2013-11-24 MED ORDER — ALUM & MAG HYDROXIDE-SIMETH 200-200-20 MG/5ML PO SUSP: 30.0000 mL | Freq: Four times a day (QID) | ORAL | Status: DC | PRN

## 2013-11-24 MED ORDER — VITAMIN B-1 100 MG PO TABS
100.0000 mg | ORAL_TABLET | Freq: Every day | ORAL | Status: DC
  Administered 2013-11-25 – 2013-11-26 (×2): 100 mg via ORAL
  Filled 2013-11-24 (×2): qty 1

## 2013-11-24 MED ORDER — OXYCODONE HCL 5 MG PO TABS
15.0000 mg | ORAL_TABLET | ORAL | Status: DC | PRN
  Administered 2013-11-24 – 2013-11-26 (×8): 15 mg via ORAL
  Filled 2013-11-24 (×8): qty 3

## 2013-11-24 MED ORDER — SENNA 8.6 MG PO TABS
1.0000 | ORAL_TABLET | Freq: Two times a day (BID) | ORAL | Status: DC
Start: 2013-11-24 — End: 2013-11-26
  Administered 2013-11-24 – 2013-11-25 (×2): 8.6 mg via ORAL
  Filled 2013-11-24 (×4): qty 1

## 2013-11-24 MED ORDER — INSULIN GLARGINE 100 UNIT/ML ~~LOC~~ SOLN
10.0000 [IU] | Freq: Every day | SUBCUTANEOUS | Status: DC
  Administered 2013-11-24 – 2013-11-25 (×2): 10 [IU] via SUBCUTANEOUS
  Filled 2013-11-24 (×2): qty 0.1

## 2013-11-24 MED ORDER — VANCOMYCIN HCL 10 G IV SOLR
2000.0000 mg | Freq: Two times a day (BID) | INTRAVENOUS | Status: DC
Start: 2013-11-25 — End: 2013-11-26
  Administered 2013-11-25 – 2013-11-26 (×3): 2000 mg via INTRAVENOUS
  Filled 2013-11-24 (×9): qty 2000

## 2013-11-24 MED ORDER — VANCOMYCIN HCL IN DEXTROSE 1-5 GM/200ML-% IV SOLN
1000.0000 mg | Freq: Once | INTRAVENOUS | Status: DC
  Administered 2013-11-24: 1000 mg via INTRAVENOUS
  Filled 2013-11-24: qty 200

## 2013-11-24 MED ORDER — ONDANSETRON HCL 4 MG PO TABS: 4.0000 mg | ORAL_TABLET | Freq: Four times a day (QID) | ORAL | Status: DC | PRN

## 2013-11-24 MED ORDER — ESCITALOPRAM OXALATE 10 MG PO TABS
10.0000 mg | ORAL_TABLET | Freq: Every day | ORAL | Status: DC
  Administered 2013-11-25 – 2013-11-26 (×2): 10 mg via ORAL
  Filled 2013-11-24 (×2): qty 1

## 2013-11-24 MED ORDER — INSULIN ASPART 100 UNIT/ML ~~LOC~~ SOLN
0.0000 [IU] | SUBCUTANEOUS | Status: DC
  Administered 2013-11-24: 8 [IU] via SUBCUTANEOUS
  Administered 2013-11-25: 5 [IU] via SUBCUTANEOUS
  Administered 2013-11-25: 3 [IU] via SUBCUTANEOUS
  Administered 2013-11-25 (×2): 5 [IU] via SUBCUTANEOUS

## 2013-11-24 MED ORDER — FOLIC ACID 1 MG PO TABS
1.0000 mg | ORAL_TABLET | Freq: Every day | ORAL | Status: DC
  Administered 2013-11-25 – 2013-11-26 (×2): 1 mg via ORAL
  Filled 2013-11-24 (×2): qty 1

## 2013-11-24 MED ORDER — DOCUSATE SODIUM 100 MG PO CAPS
100.0000 mg | ORAL_CAPSULE | Freq: Two times a day (BID) | ORAL | Status: DC
  Administered 2013-11-25: 100 mg via ORAL
  Filled 2013-11-24 (×4): qty 1

## 2013-11-24 MED ORDER — CARVEDILOL 12.5 MG PO TABS
25.0000 mg | ORAL_TABLET | Freq: Two times a day (BID) | ORAL | Status: DC
  Administered 2013-11-25: 25 mg via ORAL
  Filled 2013-11-24 (×2): qty 2
  Filled 2013-11-24 (×3): qty 1

## 2013-11-24 MED ORDER — PIPERACILLIN-TAZOBACTAM 3.375 G IVPB
3.3750 g | Freq: Once | INTRAVENOUS | Status: AC
  Administered 2013-11-24: 3.375 g via INTRAVENOUS
  Filled 2013-11-24: qty 50

## 2013-11-24 MED ORDER — LISINOPRIL 10 MG PO TABS
20.0000 mg | ORAL_TABLET | Freq: Every day | ORAL | Status: DC
  Administered 2013-11-25 – 2013-11-26 (×2): 20 mg via ORAL
  Filled 2013-11-24 (×2): qty 2

## 2013-11-24 NOTE — ED Notes (Addendum)
Pt had surgery on lt foot then sent for rehab at [facility] for 54 days, just released on Friday. Pt states lt lower front leg started to swell with redness today. Pt also states glucose has been running high (300) x2 days. CBG 322 in triage.

## 2013-11-24 NOTE — ED Provider Notes (Signed)
CSN: [id number]     Arrival date & time 11/24/13  1234 History  This chart was scribed for Shaune Pollack, MD by Eston Mould, ED Scribe. This patient was seen in room APA11/APA11 and the patient's care was started at 2:54 PM.     Chief Complaint  Patient presents with  . Leg Swelling    lt   The history is provided by the patient. No language interpreter was used.   HPI Comments: Christopher Raymond is a 38 y.o. male with DM and a hx of diabetic ulcers who presents to the Emergency Department complaining of L leg swelling and redness that began this morning. He had L foot surgery due to diabetic ulcer in April 2015. Has been in [facility] since surgery;states he has not had a F/U but was seen by wound provider. States he was discharged a week ago without medications. States he has been getting up and around but states this is a new sx. States he was on a [place] while at [employer]; denies oral abx. States glucose levels were elevated this morning,300+. Wound dressing was last changed last night; was encouraged to change dressing once a day. Has allergies to Levaquin. Denies recent fevers, nausea, emesis, diarrhea, abd pain, alcohol use, and smoking.  PCP is Dr. Alphonzo Dublin in [place]  Past Medical History  Diagnosis Date  . GERD (gastroesophageal reflux disease)   . Diabetes mellitus   . HTN (hypertension)   . Depression   . Anxiety   . Chronic pain   . ED (erectile dysfunction)   . Hyperlipidemia   . Diabetic foot ulcer    Past Surgical History  Procedure Laterality Date  . Knee surgery    . Back surgery    . Cholecystectomy  2009  . Tonsillectomy    . Cholecystectomy open    . Wound debridement Left 10/05/2013    Procedure: INCISION AND DEBRIDEMENT LEFT FOOT;  Surgeon: Jamesetta So, MD;  Location: [facility];  Service: General;  Laterality: Left;   Family History  Problem Relation Age of Onset  . Coronary artery disease Father   . Heart attack Father   . Hypertension Mother     History  Substance Use Topics  . Smoking status: Never Smoker   . Smokeless tobacco: Current User    Types: Snuff     Comment: dip x 20  . Alcohol Use: Yes     Comment: very little 4-5 beers/yr    Review of Systems  Constitutional: Negative for fever.  Gastrointestinal: Negative for nausea, vomiting, abdominal pain and diarrhea.  Skin: Positive for color change and wound.  All other systems reviewed and are negative.  Allergies  Levaquin  Home Medications   Prior to Admission medications   Medication Sig Start Date End Date Taking? Authorizing Provider  carvedilol (COREG) 25 MG tablet Take 1 tablet (25 mg total) by mouth 2 (two) times daily with a meal. 10/08/13  Yes Orson Eva, MD  escitalopram (LEXAPRO) 10 MG tablet Take 1 tablet (10 mg total) by mouth daily. 10/23/12  Yes Mikey Kirschner, MD  esomeprazole (NEXIUM) 40 MG capsule Take 1 capsule (40 mg total) by mouth daily before breakfast. 03/30/13  Yes Julianne Rice, MD  insulin aspart protamine- aspart (NOVOLOG MIX 70/30) (70-30) 100 UNIT/ML injection Inject 40 Units into the skin daily with breakfast.   Yes Historical Provider, MD  insulin glargine (LANTUS) 100 UNIT/ML injection Inject 10 Units into the skin at bedtime. 10/08/13  Yes  Orson Eva, MD  lisinopril (PRINIVIL,ZESTRIL) 20 MG tablet Take 20 mg by mouth daily.   Yes Historical Provider, MD  OxyCODONE (OXYCONTIN) 40 mg T12A 12 hr tablet Take 1 tablet (40 mg total) by mouth every 12 (twelve) hours. 10/08/13  Yes Orson Eva, MD  oxyCODONE (ROXICODONE) 15 MG immediate release tablet Take 1 tablet (15 mg total) by mouth every 4 (four) hours as needed for pain (Break Through Justus Memory). 10/08/13  Yes Orson Eva, MD   BP 135/73  Pulse 87  Temp(Src) 98.3 F (36.8 C) (Oral)  Resp 16  Ht 6\' 1"  (1.854 m)  Wt 280 lb (127.007 kg)  BMI 36.95 kg/m2  SpO2 100%  Physical Exam  Nursing note and vitals reviewed. Constitutional: He is oriented to person, place, and time. He appears  well-developed and well-nourished. No distress.  HENT:  Head: Normocephalic and atraumatic.  Eyes: EOM are normal.  Neck: Neck supple. No tracheal deviation present.  Cardiovascular: Normal rate.   Pulmonary/Chest: Effort normal. No respiratory distress.  Musculoskeletal: Normal range of motion.  Neurological: He is alert and oriented to person, place, and time.  Skin: Skin is warm and dry.  Wound to dorsal aspect to the L foot that has purulent discharge with erythema to L ankle that runs 14 cm medially and laterally. Does not extend to posterior aspect. No tenderness above. Tender, redness and warmth.   Psychiatric: He has a normal mood and affect. His behavior is normal.   ED Course  Procedures DIAGNOSTIC STUDIES: Oxygen Saturation is 100% on RA, normal by my interpretation.    COORDINATION OF CARE: 3:00 PM-Discussed treatment plan which includes review pts chart, labs, X-rays and start pt on abx. Pt agreed to plan.   Labs Review Labs Reviewed  CBG MONITORING, ED - Abnormal; Notable for the following:    Glucose-Capillary 322 (*)    All other components within normal limits  CULTURE, BLOOD (ROUTINE X 2)  CULTURE, BLOOD (ROUTINE X 2)  CBC WITH DIFFERENTIAL  BASIC METABOLIC PANEL   Imaging Review Dg Tibia/fibula Left  11/24/2013   CLINICAL DATA:  soft tissue edema  EXAM: LEFT TIBIA AND FIBULA - 2 VIEW  COMPARISON:  None.  FINDINGS: Frontal and lateral views were obtained. No fracture or dislocation. No abnormal periosteal reaction. Joint spaces appear intact.  IMPRESSION: No abnormality noted.   Electronically Signed   By: Lowella Grip M.D.   On: 11/24/2013 15:38   Dg Foot Complete Left  11/24/2013   ADDENDUM REPORT: 11/24/2013 15:42  ADDENDUM: There is a subtle lucency in the distal fifth metatarsal on the oblique view. Potentially, a small area of osteomyelitis could present in this manner. If there is concern for osteomyelitis in this region, MR would be the imaging study  of choice to further assess.   Electronically Signed   By: Lowella Grip M.D.   On: 11/24/2013 15:42   11/24/2013   CLINICAL DATA:  Soft tissue swelling ; recent laceration  EXAM: LEFT FOOT - COMPLETE 3+ VIEW  COMPARISON:  None.  FINDINGS: Frontal, oblique, and lateral views were obtained. There is generalized soft tissue edema. There is no fracture or dislocation. Joint spaces appear intact. No radiopaque foreign body. No erosive change. There is a minimal spur arising from the inferior calcaneus.  IMPRESSION: Generalized soft tissue swelling. No fracture or radiopaque foreign body. No appreciable arthropathy. Minimal calcaneal spur.  Electronically Signed: By: Lowella Grip M.D. On: 11/24/2013 15:36     EKG Interpretation None  MDM   Final diagnoses:  Cellulitis  Osteomyelitis   38 year old male with history of insulin-dependent diabetes status post debridement of left foot ulcer presents today with worsening redness and pain of his left lower extremity. His x-rays have a question of osteomyelitis of the distal fifth metatarsal which is where the wound overlies his foot. Is to tear the emergency department with Zosyn and vancomycin. Plan admission for further treatment.  Discussed with Dr. Chancy Milroy and he will see and admit.   Shaune Pollack, MD 11/24/13 [phone]

## 2013-11-24 NOTE — ED Notes (Signed)
Pt just discharged from [facility] after undergoing post-op therapy on his L. Foot during which he had IV antibiotics via PICC line. Pt states he woke up this morning with tenderness to RLE. R foot wrapped but redness and warmth extending up shin area.

## 2013-11-24 NOTE — H&P (Signed)
[facility] History and Physical  Christopher Raymond HYW:[id number] DOB: January 26, 1976 DOA: 11/24/2013  Referring physician: Pattricia Boss, MD PCP: Pcp Not In System   Chief Complaint: Cellulitis  HPI: Christopher Raymond is a 38 y.o. male with history of uncontrolled diabetes and osteomyelitis presents to the ED from a SNF for evaluation of his left foot. Patient states that he has had foot surgery back in April. After the surgery has been residing at [facility]. Patient has been getting wound care and also has been on and off antibiotics for an osteomyelitis. He states that he has had recurring bouts of cellulitis and seems to come back every time he comes off of antibiotics. Patient states that he has been noting increased redness today with increased pain also. Patient states that he has had no fever and none is recorded presently. Patient states he has no cough no congestion denies having any chest pain he does have pain in the foot. Also he states his glucose has been out of control,.   Review of Systems:  Constitutional:  No weight loss, night sweats, Fevers, chills, fatigue.  HEENT:  No headaches, Sore throat,  No sneezing, itching, ear ache, nasal congestion, post nasal drip,  Cardio-vascular:  No chest pain, Orthopnea, PND, ++swelling in lower extremities, no anasarca, dizziness, palpitations  GI:  No heartburn, indigestion, abdominal pain, nausea, vomiting, diarrhea, change in bowel habits, loss of appetite  Resp:  No shortness of breath with exertion or at rest. No excess mucus, no productive cough, No non-productive cough, No coughing up of blood.No change in color of mucus.No wheezing.No chest wall deformity  Skin: ++edema noted and also has a rash on foot as well as the ankle on the left side with drainage  GU:  no dysuria, change in color of urine, no urgency or frequency. No flank pain.  Musculoskeletal:  No joint pain or swelling. No decreased range of motion. No back pain.    Psych:  No change in mood or affect. No depression or anxiety. No memory loss.   Past Medical History  Diagnosis Date  . GERD (gastroesophageal reflux disease)   . Diabetes mellitus   . HTN (hypertension)   . Depression   . Anxiety   . Chronic pain   . ED (erectile dysfunction)   . Hyperlipidemia   . Diabetic foot ulcer    Past Surgical History  Procedure Laterality Date  . Knee surgery    . Back surgery    . Cholecystectomy  2009  . Tonsillectomy    . Cholecystectomy open    . Wound debridement Left 10/05/2013    Procedure: INCISION AND DEBRIDEMENT LEFT FOOT;  Surgeon: Jamesetta So, MD;  Location: [facility];  Service: General;  Laterality: Left;   Social History:  reports that he has never smoked. His smokeless tobacco use includes Snuff. He reports that he drinks alcohol. He reports that he does not use illicit drugs.  Allergies  Allergen Reactions  . Levaquin [Levofloxacin] Rash    Family History  Problem Relation Age of Onset  . Coronary artery disease Father   . Heart attack Father   . Hypertension Mother      Prior to Admission medications   Medication Sig Start Date End Date Taking? Authorizing Provider  carvedilol (COREG) 25 MG tablet Take 1 tablet (25 mg total) by mouth 2 (two) times daily with a meal. 10/08/13  Yes Orson Eva, MD  escitalopram (LEXAPRO) 10 MG tablet Take 1 tablet (10  mg total) by mouth daily. 10/23/12  Yes Mikey Kirschner, MD  esomeprazole (NEXIUM) 40 MG capsule Take 1 capsule (40 mg total) by mouth daily before breakfast. 03/30/13  Yes Julianne Rice, MD  insulin aspart protamine- aspart (NOVOLOG MIX 70/30) (70-30) 100 UNIT/ML injection Inject 40 Units into the skin daily with breakfast.   Yes Historical Provider, MD  insulin glargine (LANTUS) 100 UNIT/ML injection Inject 10 Units into the skin at bedtime. 10/08/13  Yes Orson Eva, MD  lisinopril (PRINIVIL,ZESTRIL) 20 MG tablet Take 20 mg by mouth daily.   Yes Historical Provider, MD  OxyCODONE  (OXYCONTIN) 40 mg T12A 12 hr tablet Take 1 tablet (40 mg total) by mouth every 12 (twelve) hours. 10/08/13  Yes Orson Eva, MD  oxyCODONE (ROXICODONE) 15 MG immediate release tablet Take 1 tablet (15 mg total) by mouth every 4 (four) hours as needed for pain (Break Through [facility]). 10/08/13  Yes Orson Eva, MD   Physical Exam: Filed Vitals:   11/24/13 1554  BP: 132/70  Pulse: 80  Temp:   Resp: 18    BP 132/70  Pulse 80  Temp(Src) 98.3 F (36.8 C) (Oral)  Resp 18  Ht 6\' 1"  (1.854 m)  Wt 127.007 kg (280 lb)  BMI 36.95 kg/m2  SpO2 98%  General:  Appears calm and comfortable Eyes: PERRL, normal lids, irises & conjunctiva ENT: grossly normal hearing, lips & tongue Neck: no LAD, masses or thyromegaly Cardiovascular: RRR, no m/r/g. No LE edema. Respiratory: CTA bilaterally, no w/r/r. Normal respiratory effort. Abdomen: soft, ntnd Skin: ++rash noted dorsal aspect of foot. There is also drainage noted from the wound on the foot Musculoskeletal: grossly normal tone BUE/BLE Psychiatric: grossly normal mood and affect, speech fluent and appropriate Neurologic: grossly non-focal.          Labs on Admission:  Basic Metabolic Panel:  Recent Labs Lab 11/24/13 1556  NA 133*  K 4.2  CL 94*  CO2 28  GLUCOSE 255*  BUN 11  CREATININE 0.64  CALCIUM 8.5   Liver Function Tests: No results found for this basename: AST, ALT, ALKPHOS, BILITOT, PROT, ALBUMIN,  in the last 168 hours No results found for this basename: LIPASE, AMYLASE,  in the last 168 hours No results found for this basename: AMMONIA,  in the last 168 hours CBC:  Recent Labs Lab 11/24/13 1556  WBC 4.9  NEUTROABS 3.2  HGB 10.5*  HCT 30.9*  MCV 80.7  PLT 217   Cardiac Enzymes: No results found for this basename: CKTOTAL, CKMB, CKMBINDEX, TROPONINI,  in the last 168 hours  BNP (last 3 results) No results found for this basename: PROBNP,  in the last 8760 hours CBG:  Recent Labs Lab 11/24/13 1251  GLUCAP 322*     Radiological Exams on Admission: Dg Tibia/fibula Left  11/24/2013   CLINICAL DATA:  soft tissue edema  EXAM: LEFT TIBIA AND FIBULA - 2 VIEW  COMPARISON:  None.  FINDINGS: Frontal and lateral views were obtained. No fracture or dislocation. No abnormal periosteal reaction. Joint spaces appear intact.  IMPRESSION: No abnormality noted.   Electronically Signed   By: Lowella Grip M.D.   On: 11/24/2013 15:38   Dg Foot Complete Left  11/24/2013   ADDENDUM REPORT: 11/24/2013 15:42  ADDENDUM: There is a subtle lucency in the distal fifth metatarsal on the oblique view. Potentially, a small area of osteomyelitis could present in this manner. If there is concern for osteomyelitis in this region, MR would be the  imaging study of choice to further assess.   Electronically Signed   By: Lowella Grip M.D.   On: 11/24/2013 15:42   11/24/2013   CLINICAL DATA:  Soft tissue swelling ; recent laceration  EXAM: LEFT FOOT - COMPLETE 3+ VIEW  COMPARISON:  None.  FINDINGS: Frontal, oblique, and lateral views were obtained. There is generalized soft tissue edema. There is no fracture or dislocation. Joint spaces appear intact. No radiopaque foreign body. No erosive change. There is a minimal spur arising from the inferior calcaneus.  IMPRESSION: Generalized soft tissue swelling. No fracture or radiopaque foreign body. No appreciable arthropathy. Minimal calcaneal spur.  Electronically Signed: By: Lowella Grip M.D. On: 11/24/2013 15:36     Assessment/Plan Principal Problem:   Cellulitis Active Problems:   Diabetes   Diabetic foot infection   Diabetic foot ulcer   Diabetic osteomyelitis   1. Acute Cellulitis of the foot -will admit for IV antibiotics -start on zosyn and vancocin -will have pharmacy adjust dosing -wound cultures -will get wound care to see patient  2. Diabetes Mellitus Uncontrolled -continue with insulin therapy -will check A1C -diabetic diet -encouraged weight loss  discussion  3. Diabetic osteomyelitis -will be continued on antibiotics  4. Hyponatremia -will hydrate with IVF     Code Status: Full Code (must indicate code status--if unknown or must be presumed, indicate so) Family Communication: None (indicate person spoken with, if applicable, with phone number if by telephone) Disposition Plan: Home(indicate anticipated LOS)  Time spent: 37min  Mearle Drew A Shaun Zuccaro [facility] Pager [phone]  **Disclaimer: This note may have been dictated with voice recognition software. Similar sounding words can inadvertently be transcribed and this note may contain transcription errors which may not have been corrected upon publication of note.**

## 2013-11-24 NOTE — Progress Notes (Signed)
ANTIBIOTIC CONSULT NOTE - INITIAL  Pharmacy Consult for Vancomycin & Zosyn Indication: cellulitis  Allergies  Allergen Reactions  . Levaquin [Levofloxacin] Rash    Patient Measurements: Height: 6\' 1"  (185.4 cm) Weight: 296 lb 11.2 oz (134.582 kg) IBW/kg (Calculated) : 79.9  Vital Signs: Temp: 98 F (36.7 C) (06/03 1847) Temp src: Oral (06/03 1847) BP: 168/79 mmHg (06/03 1847) Pulse Rate: 79 (06/03 1847) Intake/Output from previous day:   Intake/Output from this shift:    Labs:  Recent Labs  11/24/13 1556  WBC 4.9  HGB 10.5*  PLT 217  CREATININE 0.64   Estimated Creatinine Clearance: 182 ml/min (by C-G formula based on Cr of 0.64). No results found for this basename: VANCOTROUGH, VANCOPEAK, VANCORANDOM, GENTTROUGH, GENTPEAK, GENTRANDOM, TOBRATROUGH, TOBRAPEAK, TOBRARND, AMIKACINPEAK, AMIKACINTROU, AMIKACIN,  in the last 72 hours   Microbiology: No results found for this or any previous visit (from the past 720 hour(s)).  Medical History: Past Medical History  Diagnosis Date  . GERD (gastroesophageal reflux disease)   . Diabetes mellitus   . HTN (hypertension)   . Depression   . Anxiety   . Chronic pain   . ED (erectile dysfunction)   . Hyperlipidemia   . Diabetic foot ulcer     Medications:  Scheduled:  . [START ON 11/25/2013] carvedilol  25 mg Oral BID WC  . docusate sodium  100 mg Oral BID  . [START ON 11/25/2013] escitalopram  10 mg Oral Daily  . folic acid  1 mg Oral Daily  . insulin aspart  0-15 Units Subcutaneous 6 times per day  . [START ON 11/25/2013] insulin aspart protamine- aspart  40 Units Subcutaneous Q breakfast  . insulin glargine  10 Units Subcutaneous QHS  . lisinopril  20 mg Oral Daily  . multivitamin with minerals  1 tablet Oral Daily  . OxyCODONE  40 mg Oral Q12H  . [START ON 11/25/2013] pantoprazole  80 mg Oral Q1200  . senna  1 tablet Oral BID  . thiamine  100 mg Oral Daily   Assessment: 38 yo M with hx osteomyelitis of left foot &  uncontrolled diabetes who presents from SNF for increased redness & pain of left foot.   He has completed multiple rounds of antibiotics- most recently discharged from [facility] in April on Vancomycin 2gm IV q12h.   Renal function at patient's baseline.  He had excellent Vancomycin clearance in past.  Vancomycin 6/3>> Zosyn 6/3>>   Goal of Therapy:  Vancomycin trough level 15-20 mcg/ml  Plan:  Zosyn 3.375gm IV Q8h to be infused over 4hrs Give additional Vancomycin 1500mg  IV x1 now, then Vancomycin 2gm IV q12h Check Vancomycin trough at steady state Monitor renal function and cx data   Lavonia Drafts Del Wiseman 11/24/2013,8:28 PM

## 2013-11-25 ENCOUNTER — Inpatient Hospital Stay (HOSPITAL_COMMUNITY): Payer: Medicare Other

## 2013-11-25 DIAGNOSIS — E119 Type 2 diabetes mellitus without complications: Secondary | ICD-10-CM

## 2013-11-25 DIAGNOSIS — I10 Essential (primary) hypertension: Secondary | ICD-10-CM

## 2013-11-25 DIAGNOSIS — L0390 Cellulitis, unspecified: Secondary | ICD-10-CM

## 2013-11-25 DIAGNOSIS — L0291 Cutaneous abscess, unspecified: Secondary | ICD-10-CM

## 2013-11-25 DIAGNOSIS — M86179 Other acute osteomyelitis, unspecified ankle and foot: Secondary | ICD-10-CM

## 2013-11-25 LAB — HEMOGLOBIN A1C
HEMOGLOBIN A1C: 10.1 % — AB (ref <5.7)
Mean Plasma Glucose: 243 mg/dL — ABNORMAL HIGH (ref <117)

## 2013-11-25 LAB — COMPREHENSIVE METABOLIC PANEL
ALT: 10 U/L (ref 0–53)
AST: 10 U/L (ref 0–37)
Albumin: 2.9 g/dL — ABNORMAL LOW (ref 3.5–5.2)
Alkaline Phosphatase: 72 U/L (ref 39–117)
BUN: 10 mg/dL (ref 6–23)
CALCIUM: 8.5 mg/dL (ref 8.4–10.5)
CO2: 31 mEq/L (ref 19–32)
CREATININE: 0.74 mg/dL (ref 0.50–1.35)
Chloride: 100 mEq/L (ref 96–112)
GFR calc Af Amer: >90 mL/min (ref >90)
GFR calc non Af Amer: >90 mL/min (ref >90)
Glucose, Bld: 232 mg/dL — ABNORMAL HIGH (ref 70–99)
Potassium: 4.5 mEq/L (ref 3.7–5.3)
Sodium: 140 mEq/L (ref 137–147)
Total Bilirubin: 0.3 mg/dL (ref 0.3–1.2)
Total Protein: 6.5 g/dL (ref 6.0–8.3)

## 2013-11-25 LAB — TSH: TSH: 2.41 u[IU]/mL (ref 0.350–4.500)

## 2013-11-25 LAB — GLUCOSE, CAPILLARY
GLUCOSE-CAPILLARY: 227 mg/dL — AB (ref 70–99)
GLUCOSE-CAPILLARY: 206 mg/dL — AB (ref 70–99)
GLUCOSE-CAPILLARY: 189 mg/dL — AB (ref 70–99)
Glucose-Capillary: 191 mg/dL — ABNORMAL HIGH (ref 70–99)
Glucose-Capillary: 247 mg/dL — ABNORMAL HIGH (ref 70–99)
Glucose-Capillary: 255 mg/dL — ABNORMAL HIGH (ref 70–99)

## 2013-11-25 LAB — CBC
HEMATOCRIT: 30.4 % — AB (ref 39.0–52.0)
Hemoglobin: 10 g/dL — ABNORMAL LOW (ref 13.0–17.0)
MCH: 26.5 pg (ref 26.0–34.0)
MCHC: 32.9 g/dL (ref 30.0–36.0)
MCV: 80.6 fL (ref 78.0–100.0)
PLATELETS: 227 10*3/uL (ref 150–400)
RBC: 3.77 MIL/uL — ABNORMAL LOW (ref 4.22–5.81)
RDW: 13.4 % (ref 11.5–15.5)
WBC: 4.8 10*3/uL (ref 4.0–10.5)

## 2013-11-25 MED ORDER — AMLODIPINE BESYLATE 5 MG PO TABS
5.0000 mg | ORAL_TABLET | Freq: Every day | ORAL | Status: DC
  Administered 2013-11-26: 5 mg via ORAL
  Filled 2013-11-25: qty 1

## 2013-11-25 MED ORDER — GADOBENATE DIMEGLUMINE 529 MG/ML IV SOLN
20.0000 mL | Freq: Once | INTRAVENOUS | Status: AC | PRN
  Administered 2013-11-25: 20 mL via INTRAVENOUS

## 2013-11-25 MED ORDER — INSULIN ASPART 100 UNIT/ML ~~LOC~~ SOLN
0.0000 [IU] | Freq: Three times a day (TID) | SUBCUTANEOUS | Status: DC
  Administered 2013-11-25: 3 [IU] via SUBCUTANEOUS
  Administered 2013-11-25 – 2013-11-26 (×2): 8 [IU] via SUBCUTANEOUS
  Administered 2013-11-26: 5 [IU] via SUBCUTANEOUS
  Administered 2013-11-26: 3 [IU] via SUBCUTANEOUS

## 2013-11-25 MED ORDER — ENOXAPARIN SODIUM 60 MG/0.6ML ~~LOC~~ SOLN
60.0000 mg | SUBCUTANEOUS | Status: DC
  Administered 2013-11-25: 60 mg via SUBCUTANEOUS
  Filled 2013-11-25 (×2): qty 0.6

## 2013-11-25 NOTE — Consult Note (Signed)
WOC wound consult note Reason for Consult: Chronic anterior left foot wound extending from midfoot (3rd-5th metatarsal) in a patient with diabetes. Visit made in conjunction with that of Dr. Arnoldo Morale. Wound type: s/p I&D of abscess: Infectious Pressure Ulcer POA: No Measurement: 0.4cm x 4.5cm x 0.2cm.  Pinpoint open area at lateral edge measuring 0.1cm round with indeterminate depth draining small amount of purulent exudate when expressed by Dr. Arnoldo Morale. Wound bed: Clean, contracting and granulating wound with moist pink base and minor maceration at wound periphery extending to 0.2cm. Drainage (amount, consistency, odor) As described above. Periwound: intact with erythema and edema.  Some pretibial edema without induration at site of previous wound. Dressing procedure/placement/frequency: Saline moistened gauze dressings with twice daily changes.  Elevation is a crucial element of this patient's POC and he is advised to elevate left foot on two pillows while in bed and/or up in chair. [place] nursing team will not follow, but will remain available to this patient, the nursing, surgical and medical teams.  Please re-consult if needed. Thanks, Maudie Flakes, MSN, RN, [place], [place], [place] [phone])

## 2013-11-25 NOTE — Care Management Note (Addendum)
    Page 1 of 2   11/26/2013     1:04:19 PM CARE MANAGEMENT NOTE 11/26/2013  Patient:  Christopher Raymond, Christopher Raymond   Account Number:  [id number]  Date Initiated:  11/25/2013  Documentation initiated by:  Theophilus Kinds  Subjective/Objective Assessment:   Pt admitted from home with cellulitis. Pt has been staying with his girlfriend after discharge from [facility] for IV AB therapy. Pt is independent with ADL's and stated that he had all DM supplies for glucose control.     Action/Plan:   Pt stated that if he needs IV AB at discharge will need placement again. CSW is aware. Will continue to follow for discharge planning needs.   Anticipated DC Date:  11/29/2013   Anticipated DC Plan:  [place]  In-house referral  Clinical Social Worker      DC Planning Services  CM consult      [facility] Choice  HOME HEALTH   Choice offered to / List presented to:  C-1 Patient        [place] arranged  HH-1 RN      [place].   Status of service:  Completed, signed off Medicare Important Message given?  YES (If response is "NO", the following Medicare IM given date fields will be blank) Date Medicare IM given:  11/26/2013 Date Additional Medicare IM given:    Discharge Disposition:  [place]  Per UR Regulation:    If discussed at Long Length of Stay Meetings, dates discussed:    Comments:  11/26/13 [place], RN BSN CM Pt dsicharged home today with [facility] Rn (per pts choice). Romualdo Bolk of [facility] is aware and will collect the pts information from the chart. [place] services to start within 48 hours of discharge. No DME needs noted. Pt and pts nurse aware of discharge arrangements.  11/25/13 [place], RN BSN CM

## 2013-11-25 NOTE — Consult Note (Signed)
Reason for Consult: Left fifth toe osteomyelitis Referring Physician: [facility]  Christopher Raymond is an 38 y.o. male.  HPI: Patient is a 38 year old white male well known to me, status post incision and drainage of left foot abscess with wound VAC placement in April of this year who was recently discharged from a nursing home after undergoing 6 weeks of IV antibiotics. He did have a PICC line, but was removed. He was discharged several days ago and began experiencing increasing left foot swelling. He denies any fevers. He has been doing local wound care to the left foot.  Past Medical History  Diagnosis Date  . GERD (gastroesophageal reflux disease)   . Diabetes mellitus   . HTN (hypertension)   . Depression   . Anxiety   . Chronic pain   . ED (erectile dysfunction)   . Hyperlipidemia   . Diabetic foot ulcer     Past Surgical History  Procedure Laterality Date  . Knee surgery    . Back surgery    . Cholecystectomy  2009  . Tonsillectomy    . Cholecystectomy open    . Wound debridement Left 10/05/2013    Procedure: INCISION AND DEBRIDEMENT LEFT FOOT;  Surgeon: Jamesetta So, MD;  Location: [facility];  Service: General;  Laterality: Left;    Family History  Problem Relation Age of Onset  . Coronary artery disease Father   . Heart attack Father   . Hypertension Mother     Social History:  reports that he has never smoked. His smokeless tobacco use includes Snuff. He reports that he drinks alcohol. He reports that he does not use illicit drugs.  Allergies:  Allergies  Allergen Reactions  . Levaquin [Levofloxacin] Rash    Medications: I have reviewed the patient's current medications.  Results for orders placed during the hospital encounter of 11/24/13 (from the past 48 hour(s))  CBG MONITORING, ED     Status: Abnormal   Collection Time    11/24/13 12:51 PM      Result Value Ref Range   Glucose-Capillary 322 (*) 70 - 99 mg/dL  CBC WITH DIFFERENTIAL     Status:  Abnormal   Collection Time    11/24/13  3:56 PM      Result Value Ref Range   WBC 4.9  4.0 - 10.5 K/uL   RBC 3.83 (*) 4.22 - 5.81 MIL/uL   Hemoglobin 10.5 (*) 13.0 - 17.0 g/dL   HCT 30.9 (*) 39.0 - 52.0 %   MCV 80.7  78.0 - 100.0 fL   MCH 27.4  26.0 - 34.0 pg   MCHC 34.0  30.0 - 36.0 g/dL   RDW 13.4  11.5 - 15.5 %   Platelets 217  150 - 400 K/uL   Neutrophils Relative % 66  43 - 77 %   Neutro Abs 3.2  1.7 - 7.7 K/uL   Lymphocytes Relative 22  12 - 46 %   Lymphs Abs 1.1  0.7 - 4.0 K/uL   Monocytes Relative 8  3 - 12 %   Monocytes Absolute 0.4  0.1 - 1.0 K/uL   Eosinophils Relative 4  0 - 5 %   Eosinophils Absolute 0.2  0.0 - 0.7 K/uL   Basophils Relative 0  0 - 1 %   Basophils Absolute 0.0  0.0 - 0.1 K/uL  BASIC METABOLIC PANEL     Status: Abnormal   Collection Time    11/24/13  3:56 PM  Result Value Ref Range   Sodium 133 (*) 137 - 147 mEq/L   Potassium 4.2  3.7 - 5.3 mEq/L   Chloride 94 (*) 96 - 112 mEq/L   CO2 28  19 - 32 mEq/L   Glucose, Bld 255 (*) 70 - 99 mg/dL   BUN 11  6 - 23 mg/dL   Creatinine, Ser 0.64  0.50 - 1.35 mg/dL   Calcium 8.5  8.4 - 10.5 mg/dL   GFR calc non Af Amer >90  >90 mL/min   GFR calc Af Amer >90  >90 mL/min   Comment: (NOTE)     The eGFR has been calculated using the CKD EPI equation.     This calculation has not been validated in all clinical situations.     eGFR's persistently <90 mL/min signify possible Chronic Kidney     Disease.  GLUCOSE, CAPILLARY     Status: Abnormal   Collection Time    11/24/13  8:02 PM      Result Value Ref Range   Glucose-Capillary 283 (*) 70 - 99 mg/dL   Comment 1 Notify RN     Comment 2 Documented in Chart    GLUCOSE, CAPILLARY     Status: Abnormal   Collection Time    11/25/13 12:12 AM      Result Value Ref Range   Glucose-Capillary 247 (*) 70 - 99 mg/dL   Comment 1 Documented in Chart     Comment 2 Notify RN    GLUCOSE, CAPILLARY     Status: Abnormal   Collection Time    11/25/13  4:28 AM       Result Value Ref Range   Glucose-Capillary 227 (*) 70 - 99 mg/dL  CBC     Status: Abnormal   Collection Time    11/25/13  6:14 AM      Result Value Ref Range   WBC 4.8  4.0 - 10.5 K/uL   RBC 3.77 (*) 4.22 - 5.81 MIL/uL   Hemoglobin 10.0 (*) 13.0 - 17.0 g/dL   HCT 30.4 (*) 39.0 - 52.0 %   MCV 80.6  78.0 - 100.0 fL   MCH 26.5  26.0 - 34.0 pg   MCHC 32.9  30.0 - 36.0 g/dL   RDW 13.4  11.5 - 15.5 %   Platelets 227  150 - 400 K/uL  COMPREHENSIVE METABOLIC PANEL     Status: Abnormal   Collection Time    11/25/13  6:14 AM      Result Value Ref Range   Sodium 140  137 - 147 mEq/L   Comment: DELTA CHECK NOTED   Potassium 4.5  3.7 - 5.3 mEq/L   Chloride 100  96 - 112 mEq/L   CO2 31  19 - 32 mEq/L   Glucose, Bld 232 (*) 70 - 99 mg/dL   BUN 10  6 - 23 mg/dL   Creatinine, Ser 0.74  0.50 - 1.35 mg/dL   Calcium 8.5  8.4 - 10.5 mg/dL   Total Protein 6.5  6.0 - 8.3 g/dL   Albumin 2.9 (*) 3.5 - 5.2 g/dL   AST 10  0 - 37 U/L   ALT 10  0 - 53 U/L   Alkaline Phosphatase 72  39 - 117 U/L   Total Bilirubin 0.3  0.3 - 1.2 mg/dL   GFR calc non Af Amer >90  >90 mL/min   GFR calc Af Amer >90  >90 mL/min   Comment: (NOTE)  The eGFR has been calculated using the CKD EPI equation.     This calculation has not been validated in all clinical situations.     eGFR's persistently <90 mL/min signify possible Chronic Kidney     Disease.  GLUCOSE, CAPILLARY     Status: Abnormal   Collection Time    11/25/13  7:55 AM      Result Value Ref Range   Glucose-Capillary 191 (*) 70 - 99 mg/dL   Comment 1 Notify RN      Dg Tibia/fibula Left  11/24/2013   CLINICAL DATA:  soft tissue edema  EXAM: LEFT TIBIA AND FIBULA - 2 VIEW  COMPARISON:  None.  FINDINGS: Frontal and lateral views were obtained. No fracture or dislocation. No abnormal periosteal reaction. Joint spaces appear intact.  IMPRESSION: No abnormality noted.   Electronically Signed   By: Lowella Grip M.D.   On: 11/24/2013 15:38   Dg Foot  Complete Left  11/24/2013   ADDENDUM REPORT: 11/24/2013 15:42  ADDENDUM: There is a subtle lucency in the distal fifth metatarsal on the oblique view. Potentially, a small area of osteomyelitis could present in this manner. If there is concern for osteomyelitis in this region, MR would be the imaging study of choice to further assess.   Electronically Signed   By: Lowella Grip M.D.   On: 11/24/2013 15:42   11/24/2013   CLINICAL DATA:  Soft tissue swelling ; recent laceration  EXAM: LEFT FOOT - COMPLETE 3+ VIEW  COMPARISON:  None.  FINDINGS: Frontal, oblique, and lateral views were obtained. There is generalized soft tissue edema. There is no fracture or dislocation. Joint spaces appear intact. No radiopaque foreign body. No erosive change. There is a minimal spur arising from the inferior calcaneus.  IMPRESSION: Generalized soft tissue swelling. No fracture or radiopaque foreign body. No appreciable arthropathy. Minimal calcaneal spur.  Electronically Signed: By: Lowella Grip M.D. On: 11/24/2013 15:36    ROS: See chart Blood pressure 163/84, pulse 87, temperature 98 F (36.7 C), temperature source Oral, resp. rate 20, height 6' 1"  (1.854 m), weight 134.582 kg (296 lb 11.2 oz), SpO2 98.00%. Physical Exam: Pleasant white male no acute distress. Extremity examination reveals a swollen and slightly erythematous left foot with an open wound with minimal cloudy drainage present along the base of the metatarsal heads. Cultures were then taken. This was seen with the wound care nurse. No ascending cellulitis is noted. Difficult to palpate pulses 2 to left foot swelling. He is able to wiggle his toes.  Assessment/Plan: Impression: Left foot cellulitis. Surprisingly, this wound looks better than I anticipated. I would do normal saline wet-to-dry dressings to wound. Cultures are pending. Would elevate left foot with 2 pillows. We'll follow with you.  Jamesetta So 11/25/2013, 8:45 AM

## 2013-11-25 NOTE — Progress Notes (Signed)
Patient admitted from [place] with cellulitis and is currently ordered Lantus 10 units QHS, 70/30 40 units QAM, and Novolog 0-15 units Q4H.  In reviewing the chart, noted patient was hospitalized from 09/29/13 to 10/08/13 and patient was discharged on Lantus 10 units QHS and Lantus 60 units QAM for glycemic control. Talked with the patient and he verifies that he has been receiving Lantus 10 units QHS and 70/30 40 units QAM at [facility] for glycemic control. Patient states that blood glucose has been running between mid 100's mg/dl - 200's mg/dl since he has been at [facility]. CBGs have ranged from 191-247 over the past 12 hours. Will continue to follow and make recommendations as more data is collected.  Thanks, Barnie Alderman, RN, MSN, CCRN Diabetes Coordinator Inpatient Diabetes Program [phone] (Team Pager) [phone] ([facility] office) [phone] [facility] office)

## 2013-11-25 NOTE — Progress Notes (Signed)
Dressing applied this morning at 0900 by wound nurse. Dressing sliding down foot. Changed as per orders. Patient tolerated well.

## 2013-11-25 NOTE — Progress Notes (Signed)
[facility] PROGRESS NOTE  Christopher Raymond GHW:[id number] DOB: [birth year] DOA: 11/24/2013 PCP: Pcp Not In System  Assessment/Plan: #1 left foot cellulitis/septic arthritis involving the fifth metatarsal phalangeal joint and osteomyelitis involving the distal half of the fifth metatarsal and fifth proximal phalanx Patient had presented with a left foot cellulitis. MRI of the left foot consistent with septic arthritis involving the fifth metatarsal phalangeal joint and osteomyelitis involving the distal half of the fifth metatarsal and fifth proximal phalanx. Patient status post IND of the left foot abscess with wound VAC placement in April 2015 status post 6 weeks of IV antibiotics present in the back with cellulitis of the left foot. MRI consistent with cellulitis, septic arthritis and osteomyelitis. Patient has been seen by general surgery and wound has been cultured. Continue empiric IV vancomycin IV Zosyn. General surgery following and appreciate input and recommendations.  #2 hypertension Patient concerned and Coreg has affected his libido. Will discontinue Coreg and place patient on Norvasc. Continue lisinopril.  #3 poorly controlled diabetes mellitus Hemoglobin A1c is 12.1. CBGs every from 191-247. Continue Lantus, NovoLog 7030, and sliding scale insulin.  #4 depression Continue Lexapro.  #5 gastroesophageal reflux disease PPI.  #6 prophylaxis PPI for GI prophylaxis. Lovenox for DVT prophylaxis.   Code Status: Full Family Communication: Updated patient no family at bedside. Disposition Plan: Home when medically stable.   Consultants:  General surgery: Dr. Arnoldo Morale 11/25/2013  Procedures:  Plain films of the left foot 11/24/2013  Plain films of the left tibia-fibula 11/24/2013  MRI of the left foot 11/25/2013  Antibiotics:  IV vancomycin 11/24/2013  IV Zosyn 11/24/2013  HPI/Subjective: Patient with no complaints.  Objective: Filed Vitals:   11/25/13 0852   BP: 173/83  Pulse: 82  Temp:   Resp:     Intake/Output Summary (Last 24 hours) at 11/25/13 1105 Last data filed at 11/25/13 0600  Gross per 24 hour  Intake   1725 ml  Output   1300 ml  Net    425 ml   Filed Weights   11/24/13 1249 11/24/13 1847  Weight: 127.007 kg (280 lb) 134.582 kg (296 lb 11.2 oz)    Exam:   General:  NAD  Cardiovascular: RRR  Respiratory: CTAB  Abdomen: Soft, nontender, nondistended, positive bowel sounds.  Musculoskeletal: No clubbing cyanosis. Left foot with swelling, slightly erythematous with open wound with minimal cloudy drainage present along the base of the metatarsal heads.  Data Reviewed: Basic Metabolic Panel:  Recent Labs Lab 11/24/13 1556 11/25/13 0614  NA 133* 140  K 4.2 4.5  CL 94* 100  CO2 28 31  GLUCOSE 255* 232*  BUN 11 10  CREATININE 0.64 0.74  CALCIUM 8.5 8.5   Liver Function Tests:  Recent Labs Lab 11/25/13 0614  AST 10  ALT 10  ALKPHOS 72  BILITOT 0.3  PROT 6.5  ALBUMIN 2.9*   No results found for this basename: LIPASE, AMYLASE,  in the last 168 hours No results found for this basename: AMMONIA,  in the last 168 hours CBC:  Recent Labs Lab 11/24/13 1556 11/25/13 0614  WBC 4.9 4.8  NEUTROABS 3.2  --   HGB 10.5* 10.0*  HCT 30.9* 30.4*  MCV 80.7 80.6  PLT 217 227   Cardiac Enzymes: No results found for this basename: CKTOTAL, CKMB, CKMBINDEX, TROPONINI,  in the last 168 hours BNP (last 3 results) No results found for this basename: PROBNP,  in the last 8760 hours CBG:  Recent Labs Lab 11/24/13 1251 11/24/13  2002 11/25/13 0012 11/25/13 0428 11/25/13 0755  GLUCAP 322* 283* 247* 227* 191*    No results found for this or any previous visit (from the past 240 hour(s)).   Studies: Dg Tibia/fibula Left  11/24/2013   CLINICAL DATA:  soft tissue edema  EXAM: LEFT TIBIA AND FIBULA - 2 VIEW  COMPARISON:  None.  FINDINGS: Frontal and lateral views were obtained. No fracture or dislocation. No  abnormal periosteal reaction. Joint spaces appear intact.  IMPRESSION: No abnormality noted.   Electronically Signed   By: Lowella Grip M.D.   On: 11/24/2013 15:38   Mr Foot Left W Wo Contrast  11/25/2013   CLINICAL DATA:  Diabetic foot ulcer. Pain, swelling and redness. History of foot surgery 10/05/2013.  EXAM: MRI OF THE LEFT FOREFOOT WITHOUT AND WITH CONTRAST  TECHNIQUE: Multiplanar, multisequence MR imaging was performed both before and after administration of intravenous contrast.  CONTRAST:  80mL MULTIHANCE GADOBENATE DIMEGLUMINE 529 MG/ML IV SOLN  COMPARISON:  06/28/2013 and 09/29/2013.  FINDINGS: There is diffuse abnormal marrow edema in the distal aspect of the fifth metatarsal and also in the fifth proximal phalanx with intervening widening of the metatarsal phalangeal joint. There is a surrounding soft tissue abscess extending to 1 open wound on the dorsum of the foot. Findings consistent with septic arthritis and osteomyelitis. There is also severe surrounding cellulitis and myofasciitis.  IMPRESSION: MR findings consistent with septic arthritis involving the fifth metatarsal phalangeal joint with osteomyelitis involving the distal half of the fifth metatarsal and the fifth proximal phalanx.  Soft tissue abscess surrounding the fifth metatarsal phalangeal joint and extending up to the open wound on the dorsum of the foot.  Significant and diffuse cellulitis and myofasciitis.   Electronically Signed   By: Kalman Jewels M.D.   On: 11/25/2013 10:59   Dg Foot Complete Left  11/24/2013   ADDENDUM REPORT: 11/24/2013 15:42  ADDENDUM: There is a subtle lucency in the distal fifth metatarsal on the oblique view. Potentially, a small area of osteomyelitis could present in this manner. If there is concern for osteomyelitis in this region, MR would be the imaging study of choice to further assess.   Electronically Signed   By: Lowella Grip M.D.   On: 11/24/2013 15:42   11/24/2013   CLINICAL DATA:   Soft tissue swelling ; recent laceration  EXAM: LEFT FOOT - COMPLETE 3+ VIEW  COMPARISON:  None.  FINDINGS: Frontal, oblique, and lateral views were obtained. There is generalized soft tissue edema. There is no fracture or dislocation. Joint spaces appear intact. No radiopaque foreign body. No erosive change. There is a minimal spur arising from the inferior calcaneus.  IMPRESSION: Generalized soft tissue swelling. No fracture or radiopaque foreign body. No appreciable arthropathy. Minimal calcaneal spur.  Electronically Signed: By: Lowella Grip M.D. On: 11/24/2013 15:36    Scheduled Meds: . carvedilol  25 mg Oral BID WC  . docusate sodium  100 mg Oral BID  . escitalopram  10 mg Oral Daily  . folic acid  1 mg Oral Daily  . insulin aspart  0-15 Units Subcutaneous 6 times per day  . insulin aspart protamine- aspart  40 Units Subcutaneous Q breakfast  . insulin glargine  10 Units Subcutaneous QHS  . lisinopril  20 mg Oral Daily  . multivitamin with minerals  1 tablet Oral Daily  . OxyCODONE  40 mg Oral Q12H  . pantoprazole  80 mg Oral Q1200  . piperacillin-tazobactam (ZOSYN)  IV  3.375 g Intravenous 3 times per day  . senna  1 tablet Oral BID  . thiamine  100 mg Oral Daily  . vancomycin  2,000 mg Intravenous Q12H   Continuous Infusions: . sodium chloride 100 mL/hr at 11/24/13 1845    Principal Problem:   Cellulitis Active Problems:   Diabetes   Diabetic foot infection   Diabetic foot ulcer   Diabetic osteomyelitis    Time spent: [facility] Pager [phone]. If 7PM-7AM, please contact night-coverage at [email or web address], password [facility] 11/25/2013, 11:05 AM  LOS: 1 day

## 2013-11-26 LAB — CBC WITH DIFFERENTIAL/PLATELET
Basophils Absolute: 0 10*3/uL (ref 0.0–0.1)
Basophils Relative: 1 % (ref 0–1)
EOS ABS: 0.3 10*3/uL (ref 0.0–0.7)
EOS PCT: 7 % — AB (ref 0–5)
HEMATOCRIT: 30.3 % — AB (ref 39.0–52.0)
HEMOGLOBIN: 10 g/dL — AB (ref 13.0–17.0)
Lymphocytes Relative: 29 % (ref 12–46)
Lymphs Abs: 1.1 10*3/uL (ref 0.7–4.0)
MCH: 26.7 pg (ref 26.0–34.0)
MCHC: 33 g/dL (ref 30.0–36.0)
MCV: 81 fL (ref 78.0–100.0)
MONO ABS: 0.3 10*3/uL (ref 0.1–1.0)
MONOS PCT: 9 % (ref 3–12)
Neutro Abs: 2 10*3/uL (ref 1.7–7.7)
Neutrophils Relative %: 54 % (ref 43–77)
Platelets: 220 10*3/uL (ref 150–400)
RBC: 3.74 MIL/uL — ABNORMAL LOW (ref 4.22–5.81)
RDW: 13.2 % (ref 11.5–15.5)
WBC: 3.8 10*3/uL — AB (ref 4.0–10.5)

## 2013-11-26 LAB — BASIC METABOLIC PANEL
BUN: 9 mg/dL (ref 6–23)
CALCIUM: 8.8 mg/dL (ref 8.4–10.5)
CHLORIDE: 100 meq/L (ref 96–112)
CO2: 30 mEq/L (ref 19–32)
Creatinine, Ser: 0.7 mg/dL (ref 0.50–1.35)
GFR calc Af Amer: >90 mL/min (ref >90)
GFR calc non Af Amer: >90 mL/min (ref >90)
GLUCOSE: 253 mg/dL — AB (ref 70–99)
POTASSIUM: 5.1 meq/L (ref 3.7–5.3)
SODIUM: 139 meq/L (ref 137–147)

## 2013-11-26 LAB — GLUCOSE, CAPILLARY
GLUCOSE-CAPILLARY: 280 mg/dL — AB (ref 70–99)
Glucose-Capillary: 219 mg/dL — ABNORMAL HIGH (ref 70–99)
Glucose-Capillary: 165 mg/dL — ABNORMAL HIGH (ref 70–99)

## 2013-11-26 LAB — SEDIMENTATION RATE: SED RATE: 40 mm/h — AB (ref 0–16)

## 2013-11-26 MED ORDER — AMOXICILLIN-POT CLAVULANATE 875-125 MG PO TABS: 1.0000 | ORAL_TABLET | Freq: Two times a day (BID) | ORAL | Status: DC

## 2013-11-26 MED ORDER — FOLIC ACID 1 MG PO TABS: 1.0000 mg | ORAL_TABLET | Freq: Every day | ORAL | Status: DC

## 2013-11-26 MED ORDER — THIAMINE HCL 100 MG PO TABS: 100.0000 mg | ORAL_TABLET | Freq: Every day | ORAL | Status: DC

## 2013-11-26 MED ORDER — INSULIN GLARGINE 100 UNIT/ML ~~LOC~~ SOLN: 13.0000 [IU] | Freq: Every day | SUBCUTANEOUS | Status: DC

## 2013-11-26 MED ORDER — INSULIN GLARGINE 100 UNIT/ML ~~LOC~~ SOLN
13.0000 [IU] | Freq: Every day | SUBCUTANEOUS | Status: DC
  Filled 2013-11-26 (×3): qty 0.13

## 2013-11-26 MED ORDER — DSS 100 MG PO CAPS: 100.0000 mg | ORAL_CAPSULE | Freq: Two times a day (BID) | ORAL | Status: DC

## 2013-11-26 MED ORDER — INSULIN ASPART PROT & ASPART (70-30 MIX) 100 UNIT/ML ~~LOC~~ SUSP: 45.0000 [IU] | Freq: Every day | SUBCUTANEOUS | Status: DC

## 2013-11-26 MED ORDER — SENNA 8.6 MG PO TABS: 1.0000 | ORAL_TABLET | Freq: Two times a day (BID) | ORAL | Status: DC

## 2013-11-26 MED ORDER — AMLODIPINE BESYLATE 5 MG PO TABS: 5.0000 mg | ORAL_TABLET | Freq: Every day | ORAL | Status: DC

## 2013-11-26 MED ORDER — INSULIN ASPART PROT & ASPART (70-30 MIX) 100 UNIT/ML ~~LOC~~ SUSP
45.0000 [IU] | Freq: Every day | SUBCUTANEOUS | Status: DC
Start: 2013-11-27 — End: 2013-11-26

## 2013-11-26 NOTE — Discharge Summary (Signed)
Physician Discharge Summary  Christopher Raymond MPN:[id number] DOB: Nov 01, 1975 DOA: 11/24/2013  PCP: Jacqualine Code, DO  Admit date: 11/24/2013 Discharge date: 11/26/2013  Time spent: 65 minutes  Recommendations for Outpatient Follow-up:  1. Followup with Dr. Arnoldo Morale of general surgery on 12/07/2013. 2. Followup with FAVERO,JOHN PATRICK, DO in 1 week. On followup patient's diabetes and high blood pressure and may to be assessed per PCP. Patient needs a basic metabolic profile done to followup on electrolytes and renal function.  Discharge Diagnoses:  Principal Problem:   Cellulitis Active Problems:   Acute osteomyelitis of foot   GERD (gastroesophageal reflux disease)   Essential hypertension, benign   Diabetes   Diabetic foot infection   Diabetic foot ulcer   Diabetic osteomyelitis   Discharge Condition: Stable and improved  Diet recommendation: Carb modified diet  Filed Weights   11/24/13 1249 11/24/13 1847  Weight: 127.007 kg (280 lb) 134.582 kg (296 lb 11.2 oz)    History of present illness:  Christopher Raymond is a 38 y.o. male with history of uncontrolled diabetes and osteomyelitis presents to the ED from a SNF for evaluation of his left foot. Patient states that he has had foot surgery back in April. After the surgery has been residing at [facility]. Patient has been getting wound care and also has been on and off antibiotics for an osteomyelitis. He states that he has had recurring bouts of cellulitis and seems to come back every time he comes off of antibiotics. Patient states that he has been noting increased redness today with increased pain also. Patient states that he has had no fever and none is recorded presently. Patient states he has no cough no congestion denies having any chest pain he does have pain in the foot. Also he states his glucose has been out of control,.      Hospital Course:  #1 left foot cellulitis/septic arthritis involving the fifth metatarsal phalangeal  joint and osteomyelitis involving the distal half of the fifth metatarsal and fifth proximal phalanx  Patient had presented with a left foot cellulitis. MRI of the left foot consistent with septic arthritis involving the fifth metatarsal phalangeal joint and osteomyelitis involving the distal half of the fifth metatarsal and fifth proximal phalanx. Patient status post Iand D of the left foot abscess with wound VAC placement in April 2015 status post 6 weeks of IV antibiotics, presenting back with cellulitis of the left foot. General surgical consultation was obtained and patient was followed in consultation by Dr. Arnoldo Morale of general surgery.  MRI which was obtained during this hospitalization was consistent with cellulitis, septic arthritis and osteomyelitis. Patient has been seen by general surgery and wound has been cultured. Patient was initially placed empirically on IV vancomycin and IV Zosyn. Per general surgery MRI findings were similar to findings in April. Patient remained afebrile. It was recommended in discussions with general surgery, that patient be discharged home on oral Augmentin for 2 more weeks. Patient will followup with Dr. Arnoldo Morale of general surgery as outpatient.   #2 hypertension  Patient concerned that Coreg has affected his libido. Coreg was subsequently discontinued and patient was started on Norvasc. Patient was also maintained on his home regimen of lisinopril. Outpatient followup.   #3 poorly controlled diabetes mellitus  Hemoglobin A1c is 12.1. CBGs every from 191-247. Patient was on NovoLog 70/30 in the mornings as well as Lantus in the evenings. Patient's blood sugars were monitored and patient is NovoLog 70/30 dose was increased to 45 units  in the morning and his Lantus dose was increased to 13 units at bedtime. Patient is to followup with PCP as outpatient. #4 depression  Continued on home regimen of Lexapro.  #5 gastroesophageal reflux disease  Patient was maintained on a  PPI.   Procedures: Plain films of the left foot 11/24/2013  Plain films of the left tibia-fibula 11/24/2013  MRI of the left foot 11/25/2013   Consultations: General surgery: Dr. Arnoldo Morale 11/25/2013   Discharge Exam: Filed Vitals:   11/26/13 1530  BP: 132/68  Pulse: 82  Temp: 97.8 F (36.6 C)  Resp: 20    General: NAD Cardiovascular: RRR Respiratory: CTAB  Discharge Instructions You were cared for by a hospitalist during your hospital stay. If you have any questions about your discharge medications or the care you received while you were in the hospital after you are discharged, you can call the unit and asked to speak with the hospitalist on call if the hospitalist that took care of you is not available. Once you are discharged, your primary care physician will handle any further medical issues. Please note that NO REFILLS for any discharge medications will be authorized once you are discharged, as it is imperative that you return to your primary care physician (or establish a relationship with a primary care physician if you do not have one) for your aftercare needs so that they can reassess your need for medications and monitor your lab values.      Discharge Instructions   Diet Carb Modified    Complete by:  As directed      Discharge instructions    Complete by:  As directed   Follow up with Dr Arnoldo Morale in 2 weeks.     Increase activity slowly    Complete by:  As directed             Medication List    STOP taking these medications       carvedilol 25 MG tablet  Commonly known as:  COREG      TAKE these medications       amLODipine 5 MG tablet  Commonly known as:  NORVASC  Take 1 tablet (5 mg total) by mouth daily.     amoxicillin-clavulanate 875-125 MG per tablet  Commonly known as:  AUGMENTIN  Take 1 tablet by mouth 2 (two) times daily. Take for 2 weeks.     DSS 100 MG Caps  Take 100 mg by mouth 2 (two) times daily.     escitalopram 10 MG tablet   Commonly known as:  LEXAPRO  Take 1 tablet (10 mg total) by mouth daily.     esomeprazole 40 MG capsule  Commonly known as:  NEXIUM  Take 1 capsule (40 mg total) by mouth daily before breakfast.     folic acid 1 MG tablet  Commonly known as:  FOLVITE  Take 1 tablet (1 mg total) by mouth daily.     insulin aspart protamine- aspart (70-30) 100 UNIT/ML injection  Commonly known as:  NOVOLOG MIX 70/30  Inject 0.45 mLs (45 Units total) into the skin daily with breakfast.     insulin glargine 100 UNIT/ML injection  Commonly known as:  LANTUS  Inject 0.13 mLs (13 Units total) into the skin at bedtime.     lisinopril 20 MG tablet  Commonly known as:  PRINIVIL,ZESTRIL  Take 20 mg by mouth daily.     OxyCODONE 40 mg T12a 12 hr tablet  Commonly known as:  OXYCONTIN  Take 1 tablet (40 mg total) by mouth every 12 (twelve) hours.     oxyCODONE 15 MG immediate release tablet  Commonly known as:  ROXICODONE  Take 1 tablet (15 mg total) by mouth every 4 (four) hours as needed for pain (Break Through [facility]).     senna 8.6 MG Tabs tablet  Commonly known as:  SENOKOT  Take 1 tablet (8.6 mg total) by mouth 2 (two) times daily.     thiamine 100 MG tablet  Take 1 tablet (100 mg total) by mouth daily.       Allergies  Allergen Reactions  . Levaquin [Levofloxacin] Rash   Follow-up Information   Follow up with Jamesetta So, MD. Schedule an appointment as soon as possible for a visit on 12/07/2013.   Specialty:  General Surgery   Contact information:   [address] [address] [phone]       Follow up with [place].   Contact information:   [address] [place] [address] [phone]       Follow up with [facility], DO. Schedule an appointment as soon as possible for a visit in 1 week.   Specialty:  Family Medicine   Contact information:   [address] [place] [address] [phone]         The results of significant diagnostics from this hospitalization (including imaging, microbiology, ancillary and laboratory) are listed below for reference.    Significant Diagnostic Studies: Dg Tibia/fibula Left  11/24/2013   CLINICAL DATA:  soft tissue edema  EXAM: LEFT TIBIA AND FIBULA - 2 VIEW  COMPARISON:  None.  FINDINGS: Frontal and lateral views were obtained. No fracture or dislocation. No abnormal periosteal reaction. Joint spaces appear intact.  IMPRESSION: No abnormality noted.   Electronically Signed   By: Lowella Grip M.D.   On: 11/24/2013 15:38   Mr Foot Left W Wo Contrast  11/25/2013   CLINICAL DATA:  Diabetic foot ulcer. Pain, swelling and redness. History of foot surgery 10/05/2013.  EXAM: MRI OF THE LEFT FOREFOOT WITHOUT AND WITH CONTRAST  TECHNIQUE: Multiplanar, multisequence MR imaging was performed both before and after administration of intravenous contrast.  CONTRAST:  66mL MULTIHANCE GADOBENATE DIMEGLUMINE 529 MG/ML IV SOLN  COMPARISON:  06/28/2013 and 09/29/2013.  FINDINGS: There is diffuse abnormal marrow edema in the distal aspect of the fifth metatarsal and also in the fifth proximal phalanx with intervening widening of the metatarsal phalangeal joint. There is a surrounding soft tissue abscess extending to 1 open wound on the dorsum of the foot. Findings consistent with septic arthritis and osteomyelitis. There is also severe surrounding cellulitis and myofasciitis.  IMPRESSION: MR findings consistent with septic arthritis involving the fifth metatarsal phalangeal joint with osteomyelitis involving the distal half of the fifth metatarsal and the fifth proximal phalanx.  Soft tissue abscess surrounding the fifth metatarsal phalangeal joint and extending up to the open wound on the dorsum of the foot.  Significant and diffuse cellulitis and myofasciitis.   Electronically Signed   By: Kalman Jewels M.D.   On: 11/25/2013 10:59   Dg Foot Complete Left  11/24/2013    ADDENDUM REPORT: 11/24/2013 15:42  ADDENDUM: There is a subtle lucency in the distal fifth metatarsal on the oblique view. Potentially, a small area of osteomyelitis could present in this manner. If there is concern for osteomyelitis in this region, MR would be the imaging study of choice to further assess.  Electronically Signed   By: Lowella Grip M.D.   On: 11/24/2013 15:42   11/24/2013   CLINICAL DATA:  Soft tissue swelling ; recent laceration  EXAM: LEFT FOOT - COMPLETE 3+ VIEW  COMPARISON:  None.  FINDINGS: Frontal, oblique, and lateral views were obtained. There is generalized soft tissue edema. There is no fracture or dislocation. Joint spaces appear intact. No radiopaque foreign body. No erosive change. There is a minimal spur arising from the inferior calcaneus.  IMPRESSION: Generalized soft tissue swelling. No fracture or radiopaque foreign body. No appreciable arthropathy. Minimal calcaneal spur.  Electronically Signed: By: Lowella Grip M.D. On: 11/24/2013 15:36    Microbiology: Recent Results (from the past 240 hour(s))  CULTURE, BLOOD (ROUTINE X 2)     Status: None   Collection Time    11/24/13  3:56 PM      Result Value Ref Range Status   Specimen Description BLOOD RIGHT ANTECUBITAL   Final   Special Requests BOTTLES DRAWN AEROBIC AND ANAEROBIC 6CC   Final   Culture NO GROWTH 1 DAY   Final   Report Status PENDING   Incomplete  CULTURE, BLOOD (ROUTINE X 2)     Status: None   Collection Time    11/24/13  4:00 PM      Result Value Ref Range Status   Specimen Description BLOOD RIGHT HAND   Final   Special Requests     Final   Value: BOTTLES DRAWN AEROBIC AND ANAEROBIC AEB=8CC ANA=6CC   Culture NO GROWTH 1 DAY   Final   Report Status PENDING   Incomplete  WOUND CULTURE     Status: None   Collection Time    11/25/13  3:24 PM      Result Value Ref Range Status   Specimen Description WOUND LEFT FOOT   Final   Special Requests NONE   Final   Gram Stain     Final   Value:  FEW WBC PRESENT, PREDOMINANTLY PMN     RARE SQUAMOUS EPITHELIAL CELLS PRESENT     ABUNDANT GRAM VARIABLE ROD     RARE GRAM POSITIVE COCCI     IN CHAINS   Culture PENDING   Incomplete   Report Status PENDING   Incomplete     Labs: Basic Metabolic Panel:  Recent Labs Lab 11/24/13 1556 11/25/13 0614 11/26/13 0541  NA 133* 140 139  K 4.2 4.5 5.1  CL 94* 100 100  CO2 28 31 30   GLUCOSE 255* 232* 253*  BUN 11 10 9   CREATININE 0.64 0.74 0.70  CALCIUM 8.5 8.5 8.8   Liver Function Tests:  Recent Labs Lab 11/25/13 0614  AST 10  ALT 10  ALKPHOS 72  BILITOT 0.3  PROT 6.5  ALBUMIN 2.9*   No results found for this basename: LIPASE, AMYLASE,  in the last 168 hours No results found for this basename: AMMONIA,  in the last 168 hours CBC:  Recent Labs Lab 11/24/13 1556 11/25/13 0614 11/26/13 0541  WBC 4.9 4.8 3.8*  NEUTROABS 3.2  --  2.0  HGB 10.5* 10.0* 10.0*  HCT 30.9* 30.4* 30.3*  MCV 80.7 80.6 81.0  PLT 217 227 220   Cardiac Enzymes: No results found for this basename: CKTOTAL, CKMB, CKMBINDEX, TROPONINI,  in the last 168 hours BNP: BNP (last 3 results) No results found for this basename: PROBNP,  in the last 8760 hours CBG:  Recent Labs Lab 11/25/13 1622 11/25/13 2023 11/26/13 0710 11/26/13 1145 11/26/13 1630  GLUCAP 189* 255* 219* 165* 280*       Signed:  Eugenie Filler MD [facility] 11/26/2013, 4:55 PM

## 2013-11-26 NOTE — Progress Notes (Signed)
Inpatient Diabetes Program Recommendations  AACE/ADA: New Consensus Statement on Inpatient Glycemic Control (2013)  Target Ranges:  Prepandial:   less than 140 mg/dL      Peak postprandial:   less than 180 mg/dL (1-2 hours)      Critically ill patients:  140 - 180 mg/dL   Results for TRICE, ASPINALL (MRN [id number]) as of 11/26/2013 07:45  Ref. Range 11/25/2013 07:55 11/25/2013 11:27 11/25/2013 16:22 11/25/2013 20:23 11/26/2013 07:10  Glucose-Capillary Latest Range: 70-99 mg/dL 191 (H) 206 (H) 189 (H) 255 (H) 219 (H)   Diabetes history: DM2 Outpatient Diabetes medications: Lantus 10 units QHS and 70/30 40 units QAM  Current orders for Inpatient glycemic control: Lantus 10 units QHS, 70/30 40 units QAM, and Novolog 0-15 units Q4H   Inpatient Diabetes Program Recommendations Insulin - Basal: Please consider increasing 70/30 to 45 units QAM and increasing Lantus to 13 units QHS. Correction (SSI): Please consider increasing Novolog correction to resistant scale.  Thanks, Barnie Alderman, RN, MSN, CCRN Diabetes Coordinator Inpatient Diabetes Program [phone] (Team Pager) [phone] ([facility] office) [phone] [facility] office)

## 2013-11-26 NOTE — Progress Notes (Signed)
Patient with orders to be discharge home. Discharge instructions given, patient verbalized understanding. PAtient stable. Patient left in private vehicle with girlfriend.

## 2013-11-26 NOTE — Progress Notes (Signed)
Patient is being discharged from a surgical standpoint. He already has wound care instructions at home. Agree with treatment with Augmentin for 2 weeks. Will see patient in followup in my office in 2 weeks.

## 2013-11-28 LAB — WOUND CULTURE

## 2013-11-29 LAB — CULTURE, BLOOD (ROUTINE X 2)
CULTURE: NO GROWTH
CULTURE: NO GROWTH

## 2013-12-02 ENCOUNTER — Encounter (HOSPITAL_COMMUNITY): Payer: Self-pay | Admitting: Emergency Medicine

## 2013-12-02 ENCOUNTER — Inpatient Hospital Stay (HOSPITAL_COMMUNITY)
Admission: EM | Admit: 2013-12-02 | Discharge: 2013-12-05 | DRG: 638 | Disposition: A | Discharge status: Skilled Nursing Facility | Payer: Medicare Other | Attending: Family Medicine | Admitting: Family Medicine

## 2013-12-02 DIAGNOSIS — R739 Hyperglycemia, unspecified: Secondary | ICD-10-CM

## 2013-12-02 DIAGNOSIS — Z794 Long term (current) use of insulin: Secondary | ICD-10-CM

## 2013-12-02 DIAGNOSIS — I10 Essential (primary) hypertension: Secondary | ICD-10-CM | POA: Diagnosis present

## 2013-12-02 DIAGNOSIS — F3289 Other specified depressive episodes: Secondary | ICD-10-CM | POA: Diagnosis present

## 2013-12-02 DIAGNOSIS — Z79899 Other long term (current) drug therapy: Secondary | ICD-10-CM

## 2013-12-02 DIAGNOSIS — L089 Local infection of the skin and subcutaneous tissue, unspecified: Secondary | ICD-10-CM

## 2013-12-02 DIAGNOSIS — L03119 Cellulitis of unspecified part of limb: Secondary | ICD-10-CM

## 2013-12-02 DIAGNOSIS — L02619 Cutaneous abscess of unspecified foot: Secondary | ICD-10-CM | POA: Diagnosis present

## 2013-12-02 DIAGNOSIS — K625 Hemorrhage of anus and rectum: Secondary | ICD-10-CM

## 2013-12-02 DIAGNOSIS — M86679 Other chronic osteomyelitis, unspecified ankle and foot: Secondary | ICD-10-CM | POA: Diagnosis present

## 2013-12-02 DIAGNOSIS — E11628 Type 2 diabetes mellitus with other skin complications: Secondary | ICD-10-CM

## 2013-12-02 DIAGNOSIS — G8929 Other chronic pain: Secondary | ICD-10-CM | POA: Diagnosis present

## 2013-12-02 DIAGNOSIS — F17200 Nicotine dependence, unspecified, uncomplicated: Secondary | ICD-10-CM | POA: Diagnosis present

## 2013-12-02 DIAGNOSIS — M9080 Osteopathy in diseases classified elsewhere, unspecified site: Secondary | ICD-10-CM | POA: Diagnosis present

## 2013-12-02 DIAGNOSIS — E11621 Type 2 diabetes mellitus with foot ulcer: Secondary | ICD-10-CM

## 2013-12-02 DIAGNOSIS — M86179 Other acute osteomyelitis, unspecified ankle and foot: Secondary | ICD-10-CM

## 2013-12-02 DIAGNOSIS — E119 Type 2 diabetes mellitus without complications: Secondary | ICD-10-CM

## 2013-12-02 DIAGNOSIS — L0390 Cellulitis, unspecified: Secondary | ICD-10-CM

## 2013-12-02 DIAGNOSIS — E871 Hypo-osmolality and hyponatremia: Secondary | ICD-10-CM

## 2013-12-02 DIAGNOSIS — M009 Pyogenic arthritis, unspecified: Secondary | ICD-10-CM | POA: Diagnosis present

## 2013-12-02 DIAGNOSIS — L98499 Non-pressure chronic ulcer of skin of other sites with unspecified severity: Secondary | ICD-10-CM

## 2013-12-02 DIAGNOSIS — R1110 Vomiting, unspecified: Secondary | ICD-10-CM

## 2013-12-02 DIAGNOSIS — L97509 Non-pressure chronic ulcer of other part of unspecified foot with unspecified severity: Secondary | ICD-10-CM

## 2013-12-02 DIAGNOSIS — E1169 Type 2 diabetes mellitus with other specified complication: Principal | ICD-10-CM | POA: Diagnosis present

## 2013-12-02 DIAGNOSIS — Z8249 Family history of ischemic heart disease and other diseases of the circulatory system: Secondary | ICD-10-CM

## 2013-12-02 DIAGNOSIS — E785 Hyperlipidemia, unspecified: Secondary | ICD-10-CM | POA: Diagnosis present

## 2013-12-02 DIAGNOSIS — M869 Osteomyelitis, unspecified: Secondary | ICD-10-CM | POA: Diagnosis present

## 2013-12-02 DIAGNOSIS — M545 Low back pain, unspecified: Secondary | ICD-10-CM | POA: Diagnosis present

## 2013-12-02 DIAGNOSIS — K219 Gastro-esophageal reflux disease without esophagitis: Secondary | ICD-10-CM | POA: Diagnosis present

## 2013-12-02 DIAGNOSIS — F329 Major depressive disorder, single episode, unspecified: Secondary | ICD-10-CM | POA: Diagnosis present

## 2013-12-02 MED ORDER — SODIUM CHLORIDE 0.9 % IV SOLN
INTRAVENOUS | Status: DC
  Administered 2013-12-03: 125 mL via INTRAVENOUS
  Administered 2013-12-03: via INTRAVENOUS

## 2013-12-02 MED ORDER — HYDROMORPHONE HCL PF 1 MG/ML IJ SOLN
1.0000 mg | Freq: Once | INTRAMUSCULAR | Status: AC
  Administered 2013-12-03: 1 mg via INTRAVENOUS
  Filled 2013-12-02: qty 1

## 2013-12-02 MED ORDER — ONDANSETRON HCL 4 MG/2ML IJ SOLN
4.0000 mg | Freq: Once | INTRAMUSCULAR | Status: DC
  Filled 2013-12-02: qty 2

## 2013-12-02 NOTE — ED Notes (Signed)
Patient states he had left foot surgery to remove infection in April and continues to have open wound.  Patient c/o increased pain and redness that extends to left knee.

## 2013-12-02 NOTE — ED Provider Notes (Signed)
CSN: [id number]     Arrival date & time 12/02/13  2301 History   First MD Initiated Contact with Patient 12/02/13 2312     Chief Complaint  Patient presents with  . Wound Check     (Consider location/radiation/quality/duration/timing/severity/associated sxs/prior Treatment) HPI Christopher Raymond is a 38 y.o. male with history of diabetes, HTN, chronic pain, and mental health problems who presents to the ED with left foot pain and redness. He had surgery on the foot to remove infection in April and continues to have an open wound. He was admitted here on 6/3 with septic arthritis and osteomyelitis and an abscess. Dr. Arnoldo Morale did the initial surgery in April and saw him when he was here in the hospital on the third. He states that while he was getting IV antibiotics the redness seems to improve but once he was discharge and started PO medication symptoms got worse again. Now the redness and pain has increased and extends to the left knee.   Past Medical History  Diagnosis Date  . GERD (gastroesophageal reflux disease)   . Diabetes mellitus   . HTN (hypertension)   . Depression   . Anxiety   . Chronic pain   . ED (erectile dysfunction)   . Hyperlipidemia   . Diabetic foot ulcer    Past Surgical History  Procedure Laterality Date  . Knee surgery    . Back surgery    . Cholecystectomy  2009  . Tonsillectomy    . Cholecystectomy open    . Wound debridement Left 10/05/2013    Procedure: INCISION AND DEBRIDEMENT LEFT FOOT;  Surgeon: Jamesetta So, MD;  Location: [facility];  Service: General;  Laterality: Left;   Family History  Problem Relation Age of Onset  . Coronary artery disease Father   . Heart attack Father   . Hypertension Mother    History  Substance Use Topics  . Smoking status: Never Smoker   . Smokeless tobacco: Current User    Types: Snuff     Comment: dip x 20  . Alcohol Use: Yes     Comment: very little 4-5 beers/yr    Review of Systems  Constitutional: Positive  for chills. Negative for fever.  HENT: Negative.   Eyes: Negative for visual disturbance.  Respiratory: Negative for cough and shortness of breath.   Cardiovascular: Negative for chest pain.  Gastrointestinal: Negative for nausea, vomiting and abdominal pain.  Genitourinary: Negative for dysuria and frequency.  Skin: Positive for wound.  Neurological: Negative for syncope and headaches.  Psychiatric/Behavioral: Negative for confusion.      Allergies  Levaquin  Home Medications   Prior to Admission medications   Medication Sig Start Date End Date Taking? Authorizing Provider  amLODipine (NORVASC) 5 MG tablet Take 1 tablet (5 mg total) by mouth daily. 11/26/13  Yes Eugenie Filler, MD  amoxicillin-clavulanate (AUGMENTIN) 875-125 MG per tablet Take 1 tablet by mouth 2 (two) times daily. Take for 2 weeks. 11/26/13 12/10/13 Yes Eugenie Filler, MD  docusate sodium 100 MG CAPS Take 100 mg by mouth 2 (two) times daily. 11/26/13  Yes Eugenie Filler, MD  escitalopram (LEXAPRO) 10 MG tablet Take 1 tablet (10 mg total) by mouth daily. 10/23/12  Yes Mikey Kirschner, MD  esomeprazole (NEXIUM) 40 MG capsule Take 1 capsule (40 mg total) by mouth daily before breakfast. 03/30/13  Yes Julianne Rice, MD  folic acid (FOLVITE) 1 MG tablet Take 1 tablet (1 mg total) by mouth  daily. 11/26/13  Yes Eugenie Filler, MD  insulin aspart protamine- aspart (NOVOLOG MIX 70/30) (70-30) 100 UNIT/ML injection Inject 0.45 mLs (45 Units total) into the skin daily with breakfast. 11/26/13  Yes Eugenie Filler, MD  insulin glargine (LANTUS) 100 UNIT/ML injection Inject 0.13 mLs (13 Units total) into the skin at bedtime. 11/26/13  Yes Eugenie Filler, MD  lisinopril (PRINIVIL,ZESTRIL) 20 MG tablet Take 20 mg by mouth daily.   Yes Historical Provider, MD  OxyCODONE (OXYCONTIN) 40 mg T12A 12 hr tablet Take 1 tablet (40 mg total) by mouth every 12 (twelve) hours. 10/08/13  Yes Orson Eva, MD  oxyCODONE (ROXICODONE) 15 MG  immediate release tablet Take 1 tablet (15 mg total) by mouth every 4 (four) hours as needed for pain (Break Through [facility]). 10/08/13  Yes Orson Eva, MD  senna (SENOKOT) 8.6 MG TABS tablet Take 1 tablet (8.6 mg total) by mouth 2 (two) times daily. 11/26/13  Yes Eugenie Filler, MD  thiamine 100 MG tablet Take 1 tablet (100 mg total) by mouth daily. 11/26/13  Yes Eugenie Filler, MD   BP 158/81  Pulse 118  Temp(Src) 98.5 F (36.9 C) (Oral)  Resp 18  Ht 6\' 1"  (1.854 m)  Wt 290 lb (131.543 kg)  BMI 38.27 kg/m2  SpO2 95% Physical Exam  Nursing note and vitals reviewed. Constitutional: He is oriented to person, place, and time. He appears well-developed and well-nourished. No distress.  HENT:  Head: Normocephalic.  Eyes: EOM are normal.  Neck: Neck supple.  Cardiovascular: Tachycardia present.   Pulmonary/Chest: Effort normal.  Musculoskeletal:       Left foot: He exhibits tenderness and swelling.       Feet:  Open wound to left foot dorsal aspect. Erythema and swelling to the left foot, ankle and extending up the leg. Pedal pulse present, adequate circulation, warm to touch.   Neurological: He is alert and oriented to person, place, and time. No cranial nerve deficit.  Skin: Skin is warm and dry.  Psychiatric: He has a normal mood and affect. His behavior is normal.    ED Course: Dr. Kathrynn Humble in to examine the patient.   Procedures  Results for orders placed during the hospital encounter of 12/02/13 (from the past 24 hour(s))  CBC WITH DIFFERENTIAL     Status: Abnormal   Collection Time    12/02/13 11:33 PM      Result Value Ref Range   WBC 9.6  4.0 - 10.5 K/uL   RBC 4.03 (*) 4.22 - 5.81 MIL/uL   Hemoglobin 10.8 (*) 13.0 - 17.0 g/dL   HCT 31.6 (*) 39.0 - 52.0 %   MCV 78.4  78.0 - 100.0 fL   MCH 26.8  26.0 - 34.0 pg   MCHC 34.2  30.0 - 36.0 g/dL   RDW 13.1  11.5 - 15.5 %   Platelets 287  150 - 400 K/uL   Neutrophils Relative % 83 (*) 43 - 77 %   Neutro Abs 7.9 (*) 1.7 - 7.7  K/uL   Lymphocytes Relative 11 (*) 12 - 46 %   Lymphs Abs 1.1  0.7 - 4.0 K/uL   Monocytes Relative 5  3 - 12 %   Monocytes Absolute 0.5  0.1 - 1.0 K/uL   Eosinophils Relative 1  0 - 5 %   Eosinophils Absolute 0.1  0.0 - 0.7 K/uL   Basophils Relative 0  0 - 1 %   Basophils Absolute 0.0  0.0 - 0.1 K/uL  COMPREHENSIVE METABOLIC PANEL     Status: Abnormal   Collection Time    12/03/13 12:52 AM      Result Value Ref Range   Sodium 131 (*) 137 - 147 mEq/L   Potassium 4.0  3.7 - 5.3 mEq/L   Chloride 93 (*) 96 - 112 mEq/L   CO2 25  19 - 32 mEq/L   Glucose, Bld 377 (*) 70 - 99 mg/dL   BUN 9  6 - 23 mg/dL   Creatinine, Ser 0.79  0.50 - 1.35 mg/dL   Calcium 8.9  8.4 - 10.5 mg/dL   Total Protein 7.1  6.0 - 8.3 g/dL   Albumin 3.2 (*) 3.5 - 5.2 g/dL   AST 10  0 - 37 U/L   ALT 9  0 - 53 U/L   Alkaline Phosphatase 80  39 - 117 U/L   Total Bilirubin 0.3  0.3 - 1.2 mg/dL   GFR calc non Af Amer >90  >90 mL/min   GFR calc Af Amer >90  >90 mL/min    MDM  38 y.o. male with hx of osteomyelitis of the left foot. Poorly controlled diabetes.  Will admit for IV antibiotics and pain management. Discussed with Dr. Marin Comment and he will admit.       [place], NP 12/03/13 [place], NP 12/03/13 [phone]

## 2013-12-03 ENCOUNTER — Encounter (HOSPITAL_COMMUNITY): Payer: Medicare Other

## 2013-12-03 ENCOUNTER — Emergency Department (HOSPITAL_COMMUNITY): Payer: Medicare Other

## 2013-12-03 ENCOUNTER — Encounter (HOSPITAL_COMMUNITY): Payer: Self-pay | Admitting: Internal Medicine

## 2013-12-03 DIAGNOSIS — E119 Type 2 diabetes mellitus without complications: Secondary | ICD-10-CM

## 2013-12-03 DIAGNOSIS — M86179 Other acute osteomyelitis, unspecified ankle and foot: Secondary | ICD-10-CM

## 2013-12-03 DIAGNOSIS — F17200 Nicotine dependence, unspecified, uncomplicated: Secondary | ICD-10-CM | POA: Diagnosis present

## 2013-12-03 DIAGNOSIS — E1169 Type 2 diabetes mellitus with other specified complication: Secondary | ICD-10-CM | POA: Diagnosis present

## 2013-12-03 DIAGNOSIS — E785 Hyperlipidemia, unspecified: Secondary | ICD-10-CM | POA: Diagnosis present

## 2013-12-03 DIAGNOSIS — Z8249 Family history of ischemic heart disease and other diseases of the circulatory system: Secondary | ICD-10-CM | POA: Diagnosis not present

## 2013-12-03 DIAGNOSIS — I10 Essential (primary) hypertension: Secondary | ICD-10-CM | POA: Diagnosis present

## 2013-12-03 DIAGNOSIS — M545 Low back pain, unspecified: Secondary | ICD-10-CM | POA: Diagnosis present

## 2013-12-03 DIAGNOSIS — F3289 Other specified depressive episodes: Secondary | ICD-10-CM | POA: Diagnosis present

## 2013-12-03 DIAGNOSIS — L0291 Cutaneous abscess, unspecified: Secondary | ICD-10-CM

## 2013-12-03 DIAGNOSIS — K219 Gastro-esophageal reflux disease without esophagitis: Secondary | ICD-10-CM | POA: Diagnosis present

## 2013-12-03 DIAGNOSIS — L0390 Cellulitis, unspecified: Secondary | ICD-10-CM

## 2013-12-03 DIAGNOSIS — M009 Pyogenic arthritis, unspecified: Secondary | ICD-10-CM | POA: Diagnosis present

## 2013-12-03 DIAGNOSIS — M79609 Pain in unspecified limb: Secondary | ICD-10-CM | POA: Diagnosis present

## 2013-12-03 DIAGNOSIS — L03119 Cellulitis of unspecified part of limb: Secondary | ICD-10-CM | POA: Diagnosis present

## 2013-12-03 DIAGNOSIS — Z79899 Other long term (current) drug therapy: Secondary | ICD-10-CM | POA: Diagnosis not present

## 2013-12-03 DIAGNOSIS — G8929 Other chronic pain: Secondary | ICD-10-CM | POA: Diagnosis present

## 2013-12-03 DIAGNOSIS — F329 Major depressive disorder, single episode, unspecified: Secondary | ICD-10-CM | POA: Diagnosis present

## 2013-12-03 DIAGNOSIS — Z794 Long term (current) use of insulin: Secondary | ICD-10-CM | POA: Diagnosis not present

## 2013-12-03 DIAGNOSIS — M869 Osteomyelitis, unspecified: Secondary | ICD-10-CM

## 2013-12-03 DIAGNOSIS — M9080 Osteopathy in diseases classified elsewhere, unspecified site: Secondary | ICD-10-CM | POA: Diagnosis present

## 2013-12-03 DIAGNOSIS — M86679 Other chronic osteomyelitis, unspecified ankle and foot: Secondary | ICD-10-CM | POA: Diagnosis present

## 2013-12-03 LAB — CBC WITH DIFFERENTIAL/PLATELET
BASOS ABS: 0 10*3/uL (ref 0.0–0.1)
Basophils Relative: 0 % (ref 0–1)
EOS ABS: 0.1 10*3/uL (ref 0.0–0.7)
EOS PCT: 1 % (ref 0–5)
HCT: 31.6 % — ABNORMAL LOW (ref 39.0–52.0)
Hemoglobin: 10.8 g/dL — ABNORMAL LOW (ref 13.0–17.0)
Lymphocytes Relative: 11 % — ABNORMAL LOW (ref 12–46)
Lymphs Abs: 1.1 10*3/uL (ref 0.7–4.0)
MCH: 26.8 pg (ref 26.0–34.0)
MCHC: 34.2 g/dL (ref 30.0–36.0)
MCV: 78.4 fL (ref 78.0–100.0)
Monocytes Absolute: 0.5 10*3/uL (ref 0.1–1.0)
Monocytes Relative: 5 % (ref 3–12)
NEUTROS PCT: 83 % — AB (ref 43–77)
Neutro Abs: 7.9 10*3/uL — ABNORMAL HIGH (ref 1.7–7.7)
Platelets: 287 10*3/uL (ref 150–400)
RBC: 4.03 MIL/uL — ABNORMAL LOW (ref 4.22–5.81)
RDW: 13.1 % (ref 11.5–15.5)
WBC: 9.6 10*3/uL (ref 4.0–10.5)

## 2013-12-03 LAB — COMPREHENSIVE METABOLIC PANEL
ALT: 9 U/L (ref 0–53)
AST: 10 U/L (ref 0–37)
Albumin: 3.2 g/dL — ABNORMAL LOW (ref 3.5–5.2)
Alkaline Phosphatase: 80 U/L (ref 39–117)
BUN: 9 mg/dL (ref 6–23)
CALCIUM: 8.9 mg/dL (ref 8.4–10.5)
CO2: 25 mEq/L (ref 19–32)
Chloride: 93 mEq/L — ABNORMAL LOW (ref 96–112)
Creatinine, Ser: 0.79 mg/dL (ref 0.50–1.35)
GFR calc Af Amer: >90 mL/min (ref >90)
GFR calc non Af Amer: >90 mL/min (ref >90)
Glucose, Bld: 377 mg/dL — ABNORMAL HIGH (ref 70–99)
POTASSIUM: 4 meq/L (ref 3.7–5.3)
Sodium: 131 mEq/L — ABNORMAL LOW (ref 137–147)
TOTAL PROTEIN: 7.1 g/dL (ref 6.0–8.3)
Total Bilirubin: 0.3 mg/dL (ref 0.3–1.2)

## 2013-12-03 LAB — GLUCOSE, CAPILLARY
GLUCOSE-CAPILLARY: 331 mg/dL — AB (ref 70–99)
Glucose-Capillary: 255 mg/dL — ABNORMAL HIGH (ref 70–99)
Glucose-Capillary: 181 mg/dL — ABNORMAL HIGH (ref 70–99)
Glucose-Capillary: 300 mg/dL — ABNORMAL HIGH (ref 70–99)

## 2013-12-03 MED ORDER — ONDANSETRON HCL 4 MG/2ML IJ SOLN
4.0000 mg | Freq: Once | INTRAMUSCULAR | Status: AC
  Administered 2013-12-03: 4 mg via INTRAVENOUS

## 2013-12-03 MED ORDER — ONDANSETRON HCL 4 MG/2ML IJ SOLN: 4.0000 mg | Freq: Four times a day (QID) | INTRAMUSCULAR | Status: DC | PRN

## 2013-12-03 MED ORDER — INSULIN ASPART 100 UNIT/ML ~~LOC~~ SOLN
0.0000 [IU] | Freq: Three times a day (TID) | SUBCUTANEOUS | Status: DC
  Administered 2013-12-03: 8 [IU] via SUBCUTANEOUS
  Administered 2013-12-03: 3 [IU] via SUBCUTANEOUS
  Administered 2013-12-03: 11 [IU] via SUBCUTANEOUS
  Administered 2013-12-04 (×2): 5 [IU] via SUBCUTANEOUS
  Administered 2013-12-04: 8 [IU] via SUBCUTANEOUS
  Administered 2013-12-05: 5 [IU] via SUBCUTANEOUS
  Administered 2013-12-05: 8 [IU] via SUBCUTANEOUS

## 2013-12-03 MED ORDER — OXYCODONE HCL 5 MG PO TABS
15.0000 mg | ORAL_TABLET | ORAL | Status: DC | PRN
  Administered 2013-12-03 – 2013-12-05 (×11): 15 mg via ORAL
  Filled 2013-12-03 (×11): qty 3

## 2013-12-03 MED ORDER — SODIUM CHLORIDE 0.9 % IJ SOLN: 10.0000 mL | INTRAMUSCULAR | Status: DC | PRN

## 2013-12-03 MED ORDER — LISINOPRIL 10 MG PO TABS
20.0000 mg | ORAL_TABLET | Freq: Every day | ORAL | Status: DC
  Administered 2013-12-03 – 2013-12-05 (×3): 20 mg via ORAL
  Filled 2013-12-03 (×3): qty 2

## 2013-12-03 MED ORDER — HEPARIN SODIUM (PORCINE) 5000 UNIT/ML IJ SOLN
5000.0000 [IU] | Freq: Three times a day (TID) | INTRAMUSCULAR | Status: DC
  Administered 2013-12-03 – 2013-12-04 (×2): 5000 [IU] via SUBCUTANEOUS
  Filled 2013-12-03 (×3): qty 1

## 2013-12-03 MED ORDER — VANCOMYCIN HCL IN DEXTROSE 1-5 GM/200ML-% IV SOLN
1000.0000 mg | Freq: Once | INTRAVENOUS | Status: AC
  Administered 2013-12-03: 1000 mg via INTRAVENOUS
  Filled 2013-12-03: qty 200

## 2013-12-03 MED ORDER — AMLODIPINE BESYLATE 5 MG PO TABS
5.0000 mg | ORAL_TABLET | Freq: Every day | ORAL | Status: DC
  Administered 2013-12-03 – 2013-12-04 (×2): 5 mg via ORAL
  Filled 2013-12-03 (×2): qty 1

## 2013-12-03 MED ORDER — FOLIC ACID 1 MG PO TABS
1.0000 mg | ORAL_TABLET | Freq: Every day | ORAL | Status: DC
  Administered 2013-12-03 – 2013-12-05 (×3): 1 mg via ORAL
  Filled 2013-12-03 (×3): qty 1

## 2013-12-03 MED ORDER — INSULIN ASPART 100 UNIT/ML ~~LOC~~ SOLN
0.0000 [IU] | Freq: Every day | SUBCUTANEOUS | Status: DC
  Administered 2013-12-03: 3 [IU] via SUBCUTANEOUS
  Administered 2013-12-04: 2 [IU] via SUBCUTANEOUS

## 2013-12-03 MED ORDER — ONDANSETRON HCL 4 MG PO TABS: 4.0000 mg | ORAL_TABLET | Freq: Four times a day (QID) | ORAL | Status: DC | PRN

## 2013-12-03 MED ORDER — PANTOPRAZOLE SODIUM 40 MG PO TBEC
40.0000 mg | DELAYED_RELEASE_TABLET | Freq: Every day | ORAL | Status: DC
  Administered 2013-12-03 – 2013-12-05 (×3): 40 mg via ORAL
  Filled 2013-12-03 (×3): qty 1

## 2013-12-03 MED ORDER — COLLAGENASE 250 UNIT/GM EX OINT
TOPICAL_OINTMENT | Freq: Two times a day (BID) | CUTANEOUS | Status: DC
  Administered 2013-12-03: 1 via TOPICAL
  Administered 2013-12-04 (×3): via TOPICAL
  Administered 2013-12-05: 1 via TOPICAL
  Filled 2013-12-03: qty 30

## 2013-12-03 MED ORDER — INSULIN ASPART PROT & ASPART (70-30 MIX) 100 UNIT/ML ~~LOC~~ SUSP
45.0000 [IU] | Freq: Every day | SUBCUTANEOUS | Status: DC
Start: 2013-12-03 — End: 2013-12-05
  Administered 2013-12-03 – 2013-12-05 (×3): 45 [IU] via SUBCUTANEOUS
  Filled 2013-12-03 (×2): qty 10

## 2013-12-03 MED ORDER — PIPERACILLIN-TAZOBACTAM 3.375 G IVPB 30 MIN: 3.3750 g | Freq: Three times a day (TID) | INTRAVENOUS | Status: DC

## 2013-12-03 MED ORDER — SODIUM CHLORIDE 0.9 % IJ SOLN
10.0000 mL | Freq: Two times a day (BID) | INTRAMUSCULAR | Status: DC
  Administered 2013-12-04: 10 mL
  Administered 2013-12-05: 20 mL

## 2013-12-03 MED ORDER — HYDROMORPHONE HCL PF 1 MG/ML IJ SOLN
1.0000 mg | INTRAMUSCULAR | Status: DC | PRN
  Administered 2013-12-03 (×2): 1 mg via INTRAVENOUS
  Filled 2013-12-03 (×2): qty 1

## 2013-12-03 MED ORDER — PIPERACILLIN-TAZOBACTAM 3.375 G IVPB
3.3750 g | Freq: Three times a day (TID) | INTRAVENOUS | Status: DC
  Administered 2013-12-03 – 2013-12-05 (×8): 3.375 g via INTRAVENOUS
  Filled 2013-12-03 (×11): qty 50

## 2013-12-03 MED ORDER — DOCUSATE SODIUM 100 MG PO CAPS
100.0000 mg | ORAL_CAPSULE | Freq: Two times a day (BID) | ORAL | Status: DC
  Administered 2013-12-03 – 2013-12-05 (×4): 100 mg via ORAL
  Filled 2013-12-03 (×4): qty 1

## 2013-12-03 MED ORDER — PIPERACILLIN-TAZOBACTAM 3.375 G IVPB
INTRAVENOUS | Status: AC
  Filled 2013-12-03: qty 50

## 2013-12-03 MED ORDER — ACETAMINOPHEN 325 MG PO TABS
650.0000 mg | ORAL_TABLET | Freq: Once | ORAL | Status: AC
  Administered 2013-12-03: 650 mg via ORAL
  Filled 2013-12-03: qty 2

## 2013-12-03 MED ORDER — VANCOMYCIN HCL 10 G IV SOLR
2000.0000 mg | Freq: Two times a day (BID) | INTRAVENOUS | Status: DC
  Administered 2013-12-03 – 2013-12-05 (×4): 2000 mg via INTRAVENOUS
  Filled 2013-12-03 (×6): qty 2000

## 2013-12-03 MED ORDER — OXYCODONE HCL ER 20 MG PO T12A
40.0000 mg | EXTENDED_RELEASE_TABLET | Freq: Two times a day (BID) | ORAL | Status: DC
  Administered 2013-12-03 – 2013-12-05 (×6): 40 mg via ORAL
  Filled 2013-12-03 (×6): qty 2

## 2013-12-03 MED ORDER — ESCITALOPRAM OXALATE 10 MG PO TABS
10.0000 mg | ORAL_TABLET | Freq: Every day | ORAL | Status: DC
  Administered 2013-12-03 – 2013-12-05 (×3): 10 mg via ORAL
  Filled 2013-12-03 (×3): qty 1

## 2013-12-03 MED ORDER — VITAMIN B-1 100 MG PO TABS
100.0000 mg | ORAL_TABLET | Freq: Every day | ORAL | Status: DC
  Administered 2013-12-03 – 2013-12-05 (×3): 100 mg via ORAL
  Filled 2013-12-03 (×3): qty 1

## 2013-12-03 MED ORDER — VANCOMYCIN HCL 10 G IV SOLR
1500.0000 mg | Freq: Once | INTRAVENOUS | Status: AC
  Administered 2013-12-03: 1500 mg via INTRAVENOUS
  Filled 2013-12-03: qty 1500

## 2013-12-03 NOTE — ED Provider Notes (Signed)
Medical screening examination/treatment/procedure(s) were conducted as a shared visit with non-physician practitioner(s) and myself.  I personally evaluated the patient during the encounter.   EKG Interpretation None     Pt has foot infection. He is diabetic, and has some purulent discharge. Osteo per last MRI. Outpatient antibiotics failed - so will admit, start ivab. Might need picc line and possible ortho consultation for osteo.  Varney Biles, MD 12/03/13 [phone]

## 2013-12-03 NOTE — Progress Notes (Signed)
ANTIBIOTIC CONSULT NOTE - INITIAL  Pharmacy Consult for Zosyn & Vancomycin  Indication: cellulitis / osteomyelitis of left foot and extremity  Allergies  Allergen Reactions  . Levaquin [Levofloxacin] Rash    Patient Measurements: Height: 6\' 1"  (185.4 cm) Weight: 290 lb (131.543 kg) IBW/kg (Calculated) : 79.9 Adjusted Body Weight: 97 kg  Vital Signs: Temp: 99.6 F (37.6 C) (06/12 0347) Temp src: Oral (06/12 0347) BP: 134/61 mmHg (06/12 0347) Pulse Rate: 106 (06/12 0347) Intake/Output from previous day:   Intake/Output from this shift:    Labs:  Recent Labs  12/02/13 2333 12/03/13 0052  WBC 9.6  --   HGB 10.8*  --   PLT 287  --   CREATININE  --  0.79    No results found for this basename: VANCOTROUGH, VANCOPEAK, VANCORANDOM, GENTTROUGH, GENTPEAK, GENTRANDOM, TOBRATROUGH, TOBRAPEAK, TOBRARND, AMIKACINPEAK, AMIKACINTROU, AMIKACIN,  in the last 72 hours   Microbiology: Recent Results (from the past 720 hour(s))  CULTURE, BLOOD (ROUTINE X 2)     Status: None   Collection Time    11/24/13  3:56 PM      Result Value Ref Range Status   Specimen Description BLOOD RIGHT ANTECUBITAL   Final   Special Requests BOTTLES DRAWN AEROBIC AND ANAEROBIC Severance   Final   Culture NO GROWTH 5 DAYS   Final   Report Status 11/29/2013 FINAL   Final  CULTURE, BLOOD (ROUTINE X 2)     Status: None   Collection Time    11/24/13  4:00 PM      Result Value Ref Range Status   Specimen Description BLOOD RIGHT HAND   Final   Special Requests     Final   Value: BOTTLES DRAWN AEROBIC AND ANAEROBIC AEB=8CC ANA=6CC   Culture NO GROWTH 5 DAYS   Final   Report Status 11/29/2013 FINAL   Final  WOUND CULTURE     Status: None   Collection Time    11/25/13  3:24 PM      Result Value Ref Range Status   Specimen Description WOUND LEFT FOOT   Final   Special Requests NONE   Final   Gram Stain     Final   Value: FEW WBC PRESENT, PREDOMINANTLY PMN     RARE SQUAMOUS EPITHELIAL CELLS PRESENT      ABUNDANT GRAM VARIABLE ROD     RARE GRAM POSITIVE COCCI     IN CHAINS   Culture     Final   Value: FEW GROUP B STREP(S.AGALACTIAE)ISOLATED     Note: TESTING AGAINST S. AGALACTIAE NOT ROUTINELY PERFORMED DUE TO PREDICTABILITY OF AMP/PEN/VAN SUSCEPTIBILITY.     Performed at [facility]   Report Status 11/28/2013 FINAL   Final    Medical History: Past Medical History  Diagnosis Date  . GERD (gastroesophageal reflux disease)   . Diabetes mellitus   . HTN (hypertension)   . Depression   . Anxiety   . Chronic pain   . ED (erectile dysfunction)   . Hyperlipidemia   . Diabetic foot ulcer     Medications:  Scheduled:  . amLODipine  5 mg Oral Daily  . docusate sodium  100 mg Oral BID  . escitalopram  10 mg Oral Daily  . folic acid  1 mg Oral Daily  . heparin  5,000 Units Subcutaneous 3 times per day  . insulin aspart  0-15 Units Subcutaneous TID WC  . insulin aspart  0-5 Units Subcutaneous QHS  . insulin aspart protamine-  aspart  45 Units Subcutaneous Q breakfast  . lisinopril  20 mg Oral Daily  . OxyCODONE  40 mg Oral Q12H  . pantoprazole  40 mg Oral Daily  . piperacillin-tazobactam (ZOSYN)  IV  3.375 g Intravenous Q8H  . thiamine  100 mg Oral Daily  . vancomycin  1,500 mg Intravenous Once   Infusions:  . 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  Medical hx includes DM, GERD, HTN,chronic low back pain.  Patient has been on multiple courses previously of vancomycin/Zosyn.  No hx however of present tx (treated by Dr Hassell Done in [place] and Dr Arnoldo Morale locally).  Calculated CrCl of 161ml/min is definitely in error (anticipate CrCl 45-29ml/min for age and DM).  Goal of Therapy:  Will go with standard Zosyn regimen since CrCl>18ml/min.  Desire vancomycin trough level 15-50mcg/ml for osteo.  Plan:  1.  Zosyn 3.375gm IV  q8h (4hr infusions) 2. Vancomycin 1500mg  LD IV x 1, with clinical pharmacist to follow-up later in am for maintenance regimen, depending on previous hx of vancomycin courses  3.  Monitor renal function, follow indices of infection. 4.  Check steady state vancomycin trough level  Penni Homans 12/03/2013,4:28 AM

## 2013-12-03 NOTE — Clinical Social Work Placement (Signed)
Clinical Social Work Department CLINICAL SOCIAL WORK PLACEMENT NOTE 12/03/2013  Patient:  Christopher Raymond, Christopher Raymond  Account Number:  [id number] Admit date:  12/02/2013  Clinical Social Worker:  Benay Pike, LCSW  Date/time:  12/03/2013 12:39 PM  Clinical Social Work is seeking post-discharge placement for this patient at the following level of care:   [place]   (*CSW will update this form in Epic as items are completed)   12/03/2013  Patient/family provided with [place] Department of Clinical Social Work's list of facilities offering this level of care within the geographic area requested by the patient (or if unable, by the patient's family).  12/03/2013  Patient/family informed of their freedom to choose among providers that offer the needed level of care, that participate in Medicare, Medicaid or managed care program needed by the patient, have an available bed and are willing to accept the patient.  12/03/2013  Patient/family informed of [facility]' ownership interest in [facility], as well as of the fact that they are under no obligation to receive care at this facility.  PASARR submitted to EDS on  PASARR number received on   FL2 transmitted to all facilities in geographic area requested by pt/family on  12/03/2013 FL2 transmitted to all facilities within larger geographic area on   Patient informed that his/her managed care company has contracts with or will negotiate with  certain facilities, including the following:     Patient/family informed of bed offers received:   Patient chooses bed at  Physician recommends and patient chooses bed at    Patient to be transferred to  on   Patient to be transferred to facility by  Patient and family notified of transfer on  Name of family member notified:    The following physician request were entered in Epic:   Additional Comments: Pt has existing pasarr number.  Benay Pike, [place]

## 2013-12-03 NOTE — Progress Notes (Signed)
Inpatient Diabetes Program Recommendations  AACE/ADA: New Consensus Statement on Inpatient Glycemic Control (2013)  Target Ranges:  Prepandial:   less than 140 mg/dL      Peak postprandial:   less than 180 mg/dL (1-2 hours)      Critically ill patients:  140 - 180 mg/dL  Results for [name], [name] (MRN [id number]) as of 12/03/2013 08:50  Ref. Range 12/03/2013 07:21  Glucose-Capillary Latest Range: 70-99 mg/dL 331 (H)   Inpatient Diabetes Program Recommendations Insulin - Basal: please add patient's home dose Lantus 13 units at HS Thank you  Raoul Pitch BSN, RN,CDE Inpatient Diabetes Coordinator [phone] (team pager)

## 2013-12-03 NOTE — Clinical Social Work Psychosocial (Signed)
Clinical Social Work Department BRIEF PSYCHOSOCIAL ASSESSMENT 12/03/2013  Patient:  Christopher Raymond, Christopher Raymond     Account Number:  [id number]     Admit date:  12/02/2013  Clinical Social Worker:  Wyatt Haste  Date/Time:  12/03/2013 12:41 PM  Referred by:  CSW  Date Referred:  12/03/2013 Referred for  SNF Placement   Other Referral:   Interview type:  Patient Other interview type:    PSYCHOSOCIAL DATA Living Status:  SIGNIFICANT OTHER Admitted from facility:   Level of care:   Primary support name:  [place] Primary support relationship to patient:  PARTNER Degree of support available:   supportive per pt    CURRENT CONCERNS Current Concerns  Post-Acute Placement   Other Concerns:    SOCIAL WORK ASSESSMENT / PLAN CSW met with pt at bedside. Pt alert and oriented and known to CSW from previous admission. He has chronic osteomyelitis of left foot. Pt d/c to [facility] for long term antibiotics at that time. He recently d/c home and was in hospital last week, d/c home on PO antibiotics. Pt lives with his girlfriend whom he describes as his best support. Yesterday, pt states his foot was swollen and painful. He reports he has a high pain tolerance but was unable to tolerate the pain. Pt has PICC line again as he will require 4-6 weeks IV antibiotics. He is requesting to go to [facility] as [facility] was too hot for him. CSW discussed placement process and provided SNF list. He reports if he does not get [facility], pt will likely return home and feels he can manage antibiotics on his own. CSW asked pt how he was coping with infection and frequent admissions at hospital and SNF. He reports it is "getting old" as he has been dealing with this since December. CSW provided brief support.   Assessment/plan status:  Psychosocial Support/Ongoing Assessment of Needs Other assessment/ plan:   Information/referral to community resources:   SNF list    PATIENT'S/FAMILY'S RESPONSE TO PLAN OF CARE: Pt requesting [facility]  only at this point, with back up plan to return home with home health. CSW will initiate referral and follow up.       Benay Pike, [place]

## 2013-12-03 NOTE — Clinical Social Work Placement (Signed)
Clinical Social Work Department CLINICAL SOCIAL WORK PLACEMENT NOTE 12/03/2013  Patient:  Christopher Raymond, Christopher Raymond  Account Number:  [id number] Admit date:  12/02/2013  Clinical Social Worker:  Benay Pike, LCSW  Date/time:  12/03/2013 12:39 PM  Clinical Social Work is seeking post-discharge placement for this patient at the following level of care:   [place]   (*CSW will update this form in Epic as items are completed)   12/03/2013  Patient/family provided with [place] Department of Clinical Social Work's list of facilities offering this level of care within the geographic area requested by the patient (or if unable, by the patient's family).  12/03/2013  Patient/family informed of their freedom to choose among providers that offer the needed level of care, that participate in Medicare, Medicaid or managed care program needed by the patient, have an available bed and are willing to accept the patient.  12/03/2013  Patient/family informed of [facility]' ownership interest in [facility], as well as of the fact that they are under no obligation to receive care at this facility.  PASARR submitted to EDS on  PASARR number received on   FL2 transmitted to all facilities in geographic area requested by pt/family on  12/03/2013 FL2 transmitted to all facilities within larger geographic area on   Patient informed that his/her managed care company has contracts with or will negotiate with  certain facilities, including the following:     Patient/family informed of bed offers received:  12/03/2013 Patient chooses bed at [facility] Physician recommends and patient chooses bed at  [facility]  Patient to be transferred to  on   Patient to be transferred to facility by  Patient and family notified of transfer on  Name of family member notified:    The following physician request were entered in Epic:   Additional Comments: Pt has existing pasarr  number.  Benay Pike, [place]

## 2013-12-03 NOTE — Progress Notes (Signed)
Patient seen examined data base reviewed Patient known to me from prior admission to hospital Continue IV antibiotics today, monitor redness erythema and look wound in the morning. If remains stable may be able to be discharged For 4-6 weeks of IV vancomycin. Likely discharge in am   Verneita Griffes, MD [facility] [phone]

## 2013-12-03 NOTE — Consult Note (Signed)
Patient well known to me. He was supposed to see me in my office yesterday as an outpatient. He presented with increasing swelling and pain left foot. His left foot wound has not significantly changed. Some drainage is noted. A PICC line has been placed. I would suggest another 4-6 week course of IV vancomycin. This can be done at home. At this point, further surgical debridement is not needed. I would like to see how his foot progresses while on IV antibiotic. He has refused amputation in the past. He does not seem to have an ascending cellulitis of the left lower extremity at this time.

## 2013-12-03 NOTE — Clinical Social Work Note (Signed)
Pt received bed offer at [facility] which he accepts. Facility notified and can accept pt over weekend if ready.   Christopher Raymond, [place]

## 2013-12-03 NOTE — Care Management Utilization Note (Signed)
UR completed 

## 2013-12-03 NOTE — H&P (Signed)
[facility] History and Physical  Christopher Raymond ENI:[id number] DOB: 29-Feb-1976    PCP:   Jacqualine Code, DO   Chief Complaint: increase purulent discharge and pain on the left foot.  HPI: Christopher Raymond is an 38 y.o. male with hx of DM, chronic back pain on chronic narcotic, hx of HTN, chronic osteomyelitis of his left foot, originally cared by Dr Hassell Done in [place] and curently by Dr Arnoldo Morale,  hx of hyperlipidemia, anxiety, depression, preesents to the ER as he has increase pain and purulent discharge from his left foot.  He had prior antibiotic use through a PICC line and had one and a half month of VAN/ZOSYN.  Evaluation in the ER Included a normal renal fx, no leukocytosis, and unremarkable electrolytes.  His plain film today showed evidence of osteomyelitis and septic arthritis.  Hospitalist was asked to admit him for further evaluation and Tx of persisitent cellulitis, osteomyelitis, and septic arthritis in a diabetic patient.  Rewiew of Systems:  Constitutional: Negative for malaise, fever and chills. No significant weight loss or weight gain Eyes: Negative for eye pain, redness and discharge, diplopia, visual changes, or flashes of light. ENMT: Negative for ear pain, hoarseness, nasal congestion, sinus pressure and sore throat. No headaches; tinnitus, drooling, or problem swallowing. Cardiovascular: Negative for chest pain, palpitations, diaphoresis, dyspnea and peripheral edema. ; No orthopnea, PND Respiratory: Negative for cough, hemoptysis, wheezing and stridor. No pleuritic chestpain. Gastrointestinal: Negative for nausea, vomiting, diarrhea, constipation, abdominal pain, melena, blood in stool, hematemesis, jaundice and rectal bleeding.    Genitourinary: Negative for frequency, dysuria, incontinence,flank pain and hematuria; Musculoskeletal: Negative for  neck pain. Negative for trauma.;  Skin: . Negative for pruritus, rash, abrasions, bruising and skin lesion.;  ulcerations Neuro: Negative for headache, lightheadedness and neck stiffness. Negative for weakness, altered level of consciousness , altered mental status, extremity weakness, burning feet, involuntary movement, seizure and syncope.  Psych: negative for anxiety, depression, insomnia, tearfulness, panic attacks, hallucinations, paranoia, suicidal or homicidal ideation    Past Medical History  Diagnosis Date  . GERD (gastroesophageal reflux disease)   . Diabetes mellitus   . HTN (hypertension)   . Depression   . Anxiety   . Chronic pain   . ED (erectile dysfunction)   . Hyperlipidemia   . Diabetic foot ulcer     Past Surgical History  Procedure Laterality Date  . Knee surgery    . Back surgery    . Cholecystectomy  2009  . Tonsillectomy    . Cholecystectomy open    . Wound debridement Left 10/05/2013    Procedure: INCISION AND DEBRIDEMENT LEFT FOOT;  Surgeon: Jamesetta So, MD;  Location: [facility];  Service: General;  Laterality: Left;    Medications:  HOME MEDS: Prior to Admission medications   Medication Sig Start Date End Date Taking? Authorizing Provider  amLODipine (NORVASC) 5 MG tablet Take 1 tablet (5 mg total) by mouth daily. 11/26/13  Yes Eugenie Filler, MD  amoxicillin-clavulanate (AUGMENTIN) 875-125 MG per tablet Take 1 tablet by mouth 2 (two) times daily. Take for 2 weeks. 11/26/13 12/10/13 Yes Eugenie Filler, MD  docusate sodium 100 MG CAPS Take 100 mg by mouth 2 (two) times daily. 11/26/13  Yes Eugenie Filler, MD  escitalopram (LEXAPRO) 10 MG tablet Take 1 tablet (10 mg total) by mouth daily. 10/23/12  Yes Mikey Kirschner, MD  esomeprazole (NEXIUM) 40 MG capsule Take 1 capsule (40 mg total) by mouth daily before breakfast. 03/30/13  Yes Julianne Rice, MD  folic acid (FOLVITE) 1 MG tablet Take 1 tablet (1 mg total) by mouth daily. 11/26/13  Yes Eugenie Filler, MD  insulin aspart protamine- aspart (NOVOLOG MIX 70/30) (70-30) 100 UNIT/ML injection Inject 0.45 mLs  (45 Units total) into the skin daily with breakfast. 11/26/13  Yes Eugenie Filler, MD  insulin glargine (LANTUS) 100 UNIT/ML injection Inject 0.13 mLs (13 Units total) into the skin at bedtime. 11/26/13  Yes Eugenie Filler, MD  lisinopril (PRINIVIL,ZESTRIL) 20 MG tablet Take 20 mg by mouth daily.   Yes Historical Provider, MD  OxyCODONE (OXYCONTIN) 40 mg T12A 12 hr tablet Take 1 tablet (40 mg total) by mouth every 12 (twelve) hours. 10/08/13  Yes Orson Eva, MD  oxyCODONE (ROXICODONE) 15 MG immediate release tablet Take 1 tablet (15 mg total) by mouth every 4 (four) hours as needed for pain (Break Through [facility]). 10/08/13  Yes Orson Eva, MD  senna (SENOKOT) 8.6 MG TABS tablet Take 1 tablet (8.6 mg total) by mouth 2 (two) times daily. 11/26/13  Yes Eugenie Filler, MD  thiamine 100 MG tablet Take 1 tablet (100 mg total) by mouth daily. 11/26/13  Yes Eugenie Filler, MD     Allergies:  Allergies  Allergen Reactions  . Levaquin [Levofloxacin] Rash    Social History:   reports that he has never smoked. His smokeless tobacco use includes Snuff. He reports that he drinks alcohol. He reports that he does not use illicit drugs.  Family History: Family History  Problem Relation Age of Onset  . Coronary artery disease Father   . Heart attack Father   . Hypertension Mother      Physical Exam: Filed Vitals:   12/02/13 2309  BP: 158/81  Pulse: 118  Temp: 98.5 F (36.9 C)  TempSrc: Oral  Resp: 18  Height: 6\' 1"  (1.854 m)  Weight: 131.543 kg (290 lb)  SpO2: 95%   Blood pressure 158/81, pulse 118, temperature 98.5 F (36.9 C), temperature source Oral, resp. rate 18, height 6\' 1"  (1.854 m), weight 131.543 kg (290 lb), SpO2 95.00%.  GEN:  Pleasant  patient lying in the stretcher in no acute distress; cooperative with exam. PSYCH:  alert and oriented x4; does not appear anxious or depressed; affect is appropriate. HEENT: Mucous membranes pink and anicteric; PERRLA; EOM intact; no cervical  lymphadenopathy nor thyromegaly or carotid bruit; no JVD; There were no stridor. Neck is very supple. Breasts:: Not examined CHEST WALL: No tenderness CHEST: Normal respiration, clear to auscultation bilaterally.  HEART: Regular rate and rhythm.  There are no murmur, rub, or gallops.   BACK: No kyphosis or scoliosis; no CVA tenderness ABDOMEN: soft and non-tender; no masses, no organomegaly, normal abdominal bowel sounds; no pannus; no intertriginous candida. There is no rebound and no distention. Rectal Exam: Not done EXTREMITIES: No bone or joint deformity; age-appropriate arthropathy of the hands and knees; no edema;    There is no calf tenderness. He has purulent and foul smelling discharge from the wound on left foot. Genitalia: not examined PULSES: 2+ and symmetric SKIN: Normal hydration no rash or ulceration CNS: Cranial nerves 2-12 grossly intact no focal lateralizing neurologic deficit.  Speech is fluent; uvula elevated with phonation, facial symmetry and tongue midline. DTR are normal bilaterally, cerebella exam is intact, barbinski is negative and strengths are equaled bilaterally.  No sensory loss.   Labs on Admission:  Basic Metabolic Panel:  Recent Labs Lab 11/26/13 0541 12/03/13 0052  NA 139 131*  K 5.1 4.0  CL 100 93*  CO2 30 25  GLUCOSE 253* 377*  BUN 9 9  CREATININE 0.70 0.79  CALCIUM 8.8 8.9   Liver Function Tests:  Recent Labs Lab 12/03/13 0052  AST 10  ALT 9  ALKPHOS 80  BILITOT 0.3  PROT 7.1  ALBUMIN 3.2*   No results found for this basename: LIPASE, AMYLASE,  in the last 168 hours No results found for this basename: AMMONIA,  in the last 168 hours CBC:  Recent Labs Lab 11/26/13 0541 12/02/13 2333  WBC 3.8* 9.6  NEUTROABS 2.0 7.9*  HGB 10.0* 10.8*  HCT 30.3* 31.6*  MCV 81.0 78.4  PLT 220 287   Cardiac Enzymes: No results found for this basename: CKTOTAL, CKMB, CKMBINDEX, TROPONINI,  in the last 168 hours  CBG:  Recent Labs Lab  11/26/13 0710 11/26/13 1145 11/26/13 1630  GLUCAP 219* 165* 280*     Radiological Exams on Admission: Dg Foot Complete Left  12/03/2013   CLINICAL DATA:  Increased edema and pain in the foot. History of diabetes.  EXAM: LEFT FOOT - COMPLETE 3+ VIEW  COMPARISON:  MRI of the foot 11/25/2013. Plain films of the foot 11/24/2013.  FINDINGS: There is osteomyelitis of the fifth metatarsal and septic arthritis of the fifth MTP joint. Soft tissue swelling is present. Marked worsening of osseous destruction and joint space widening when compared with previous plain films.  IMPRESSION: Septic arthritis and osteomyelitis involving the fifth MTP joint and regional bones.   Electronically Signed   By: Rolla Flatten M.D.   On: 12/03/2013 01:58    Assessment/Plan Present on Admission:  . Diabetic osteomyelitis . GERD (gastroesophageal reflux disease) . Essential hypertension, benign . Chronic low back pain . Cellulitis . Osteomyelitis  PLAN:  Chronic osteomyelitis of left foot.  Will need IV antibiotic again, I think.  Will continue with IV VAN/ZOSYN.  He will need a PICC line, so I ordered it for tomorrow.  Please consult Dr Arnoldo Morale for further recommendation.  For his DM, will continue basal Lantus, and use SSI of moderate scale.  For his pain, will continue long acting morphine, d/c quick acting one and use PRN IV Dilaudid.  He is stable, full code, and will be admitted to hospitalist service.  Thank you for asking me to participate in his care.  Other plans as per orders.  Code Status: FULL Haskel Khan, MD. [facility] Pager [phone] 7pm to 7am.  12/03/2013, 2:34 AM

## 2013-12-03 NOTE — Care Management Note (Signed)
    Page 1 of 1   12/03/2013     2:48:44 PM CARE MANAGEMENT NOTE 12/03/2013  Patient:  EUTIMIO, GHARIBIAN   Account Number:  [id number]  Date Initiated:  12/03/2013  Documentation initiated by:  Claretha Cooper  Subjective/Objective Assessment:   Pt admitted for recurrent osteo of foot. Pt would like to go to [place] at [facility] for IV antibiotics.     Action/Plan:   Anticipated DC Date:  12/05/2013   Anticipated DC Plan:  SKILLED NURSING FACILITY  In-house referral  Clinical Social Worker      DC Planning Services  CM consult      Choice offered to / List presented to:             Status of service:  Completed, signed off Medicare Important Message given?   (If response is "NO", the following Medicare IM given date fields will be blank) Date Medicare IM given:   Date Additional Medicare IM given:    Discharge Disposition:    Per UR Regulation:    If discussed at Long Length of Stay Meetings, dates discussed:    Comments:  12/03/13 Claretha Cooper RN CM CSW assisting on placement for [facility].

## 2013-12-03 NOTE — Progress Notes (Signed)
ANTIBIOTIC CONSULT NOTE - INITIAL  Pharmacy Consult for Vancomycin & Zosyn Indication: cellulitis  Allergies  Allergen Reactions  . Levaquin [Levofloxacin] Rash    Patient Measurements: Height: 6\' 1"  (185.4 cm) Weight: 290 lb (131.543 kg) IBW/kg (Calculated) : 79.9  Vital Signs: Temp: 99.6 F (37.6 C) (06/12 0347) Temp src: Oral (06/12 0347) BP: 134/61 mmHg (06/12 0347) Pulse Rate: 106 (06/12 0347) Intake/Output from previous day: 06/11 0701 - 06/12 0700 In: 1318.8 [I.V.:768.8; IV Piggyback:550] Out: -  Intake/Output from this shift:    Labs:  Recent Labs  12/02/13 2333 12/03/13 0052  WBC 9.6  --   HGB 10.8*  --   PLT 287  --   CREATININE  --  0.79   Estimated Creatinine Clearance: 179.7 ml/min (by C-G formula based on Cr of 0.79). No results found for this basename: VANCOTROUGH, Corlis Leak, VANCORANDOM, GENTTROUGH, GENTPEAK, GENTRANDOM, TOBRATROUGH, TOBRAPEAK, TOBRARND, AMIKACINPEAK, AMIKACINTROU, AMIKACIN,  in the last 72 hours   Microbiology: Recent Results (from the past 720 hour(s))  CULTURE, BLOOD (ROUTINE X 2)     Status: None   Collection Time    11/24/13  3:56 PM      Result Value Ref Range Status   Specimen Description BLOOD RIGHT ANTECUBITAL   Final   Special Requests BOTTLES DRAWN AEROBIC AND ANAEROBIC [place]   Final   Culture NO GROWTH 5 DAYS   Final   Report Status 11/29/2013 FINAL   Final  CULTURE, BLOOD (ROUTINE X 2)     Status: None   Collection Time    11/24/13  4:00 PM      Result Value Ref Range Status   Specimen Description BLOOD RIGHT HAND   Final   Special Requests     Final   Value: BOTTLES DRAWN AEROBIC AND ANAEROBIC AEB=8CC ANA=6CC   Culture NO GROWTH 5 DAYS   Final   Report Status 11/29/2013 FINAL   Final  WOUND CULTURE     Status: None   Collection Time    11/25/13  3:24 PM      Result Value Ref Range Status   Specimen Description WOUND LEFT FOOT   Final   Special Requests NONE   Final   Gram Stain     Final   Value: FEW WBC  PRESENT, PREDOMINANTLY PMN     RARE SQUAMOUS EPITHELIAL CELLS PRESENT     ABUNDANT GRAM VARIABLE ROD     RARE GRAM POSITIVE COCCI     IN CHAINS   Culture     Final   Value: FEW GROUP B STREP(S.AGALACTIAE)ISOLATED     Note: TESTING AGAINST S. AGALACTIAE NOT ROUTINELY PERFORMED DUE TO PREDICTABILITY OF AMP/PEN/VAN SUSCEPTIBILITY.     Performed at [facility]   Report Status 11/28/2013 FINAL   Final    Medical History: Past Medical History  Diagnosis Date  . GERD (gastroesophageal reflux disease)   . Diabetes mellitus   . HTN (hypertension)   . Depression   . Anxiety   . Chronic pain   . ED (erectile dysfunction)   . Hyperlipidemia   . Diabetic foot ulcer     Medications:  Scheduled:  . amLODipine  5 mg Oral Daily  . docusate sodium  100 mg Oral BID  . escitalopram  10 mg Oral Daily  . folic acid  1 mg Oral Daily  . heparin  5,000 Units Subcutaneous 3 times per day  . insulin aspart  0-15 Units Subcutaneous TID WC  . insulin aspart  0-5 Units Subcutaneous QHS  . insulin aspart protamine- aspart  45 Units Subcutaneous Q breakfast  . lisinopril  20 mg Oral Daily  . OxyCODONE  40 mg Oral Q12H  . pantoprazole  40 mg Oral Daily  . piperacillin-tazobactam (ZOSYN)  IV  3.375 g Intravenous Q8H  . thiamine  100 mg Oral Daily   Assessment: 38 yo M with hx osteomyelitis of left foot & uncontrolled diabetes who presents cellulitis / osteomyelitis.  He has completed multiple rounds of antibiotics- most recently discharged [facility] in June 5th after receiving Vancomycin and Zosyn in hospital (6/3-6/5) and sent home on Augmentin (6/5-6/11).   Renal function at patient's baseline.  He had excellent Vancomycin clearance in past. Patient received initial does of Vancomycin 1500mg  this AM.  Vancomycin 6/12>> Zosyn 6/12>>   Goal of Therapy:  Vancomycin trough level 15-20 mcg/ml  Plan:  Zosyn 3.375gm IV Q8h to be infused over 4hrs Give additional Vancomycin 1000mg  IV x1 now,  then Vancomycin 2gm IV q12h Check Vancomycin trough at steady state Monitor renal function and cx data   Pricilla Larsson 12/03/2013,8:20 AM

## 2013-12-03 NOTE — ED Notes (Signed)
X-ray at bedside

## 2013-12-04 LAB — BASIC METABOLIC PANEL
BUN: 10 mg/dL (ref 6–23)
CHLORIDE: 97 meq/L (ref 96–112)
CO2: 27 mEq/L (ref 19–32)
Calcium: 8.2 mg/dL — ABNORMAL LOW (ref 8.4–10.5)
Creatinine, Ser: 0.84 mg/dL (ref 0.50–1.35)
GFR calc Af Amer: >90 mL/min (ref >90)
GLUCOSE: 295 mg/dL — AB (ref 70–99)
POTASSIUM: 3.7 meq/L (ref 3.7–5.3)
SODIUM: 136 meq/L — AB (ref 137–147)

## 2013-12-04 LAB — GLUCOSE, CAPILLARY
GLUCOSE-CAPILLARY: 242 mg/dL — AB (ref 70–99)
GLUCOSE-CAPILLARY: 227 mg/dL — AB (ref 70–99)

## 2013-12-04 MED ORDER — AMLODIPINE BESYLATE 5 MG PO TABS
10.0000 mg | ORAL_TABLET | Freq: Every day | ORAL | Status: DC
  Administered 2013-12-05: 10 mg via ORAL
  Filled 2013-12-04: qty 2

## 2013-12-04 NOTE — Progress Notes (Signed)
Note: This document was prepared with digital dictation and possible smart phrase technology. Any transcriptional errors that result from this process are unintentional.   Christopher Raymond XBD:[id number] DOB: 1976-02-27 DOA: 12/02/2013 PCP: Jacqualine Code, DO  Brief narrative: 38 year old with chronic osteomyelitis and at least 4-5 admissions since December 2014 readmitted with osteomyelitis once again  Past medical history-As per Problem list Chart reviewed as below- Reviewed  Consultants:  General surgery  Procedures:  None  Antibiotics:  Vancomycin/12  Zosyn 6/12   Subjective  Doing fair. Tolerating diet. 30 minutes come down. Some mild oozing from wound see picture below   Objective    Interim History: None  Telemetry: None   Objective: Filed Vitals:   12/03/13 0347 12/03/13 1500 12/03/13 2042 12/04/13 0427  BP: 134/61 147/78 153/89 162/100  Pulse: 106 86 94 93  Temp: 99.6 F (37.6 C) 98.2 F (36.8 C) 99.2 F (37.3 C) 98.4 F (36.9 C)  TempSrc: Oral Oral Oral Oral  Resp:  18 18 18   Height: 6\' 1"  (1.854 m)     Weight: 131.543 kg (290 lb)     SpO2: 98% 99% 99% 100%    Intake/Output Summary (Last 24 hours) at 12/04/13 1433 Last data filed at 12/03/13 2000  Gross per 24 hour  Intake    720 ml  Output      0 ml  Net    720 ml    Exam:  General: Alert pleasant oriented no apparent distress Cardiovascular: S1-S2 no murmur rub or gallop Respiratory: Clear Abdomen: Soft nontender nondistended the Skin   Neuro intact  Data Reviewed: Basic Metabolic Panel:  Recent Labs Lab 12/03/13 0052 12/04/13 0500  NA 131* 136*  K 4.0 3.7  CL 93* 97  CO2 25 27  GLUCOSE 377* 295*  BUN 9 10  CREATININE 0.79 0.84  CALCIUM 8.9 8.2*   Liver Function Tests:  Recent Labs Lab 12/03/13 0052  AST 10  ALT 9  ALKPHOS 80  BILITOT 0.3  PROT 7.1  ALBUMIN 3.2*   No results found for this basename: LIPASE, AMYLASE,  in the last 168 hours No  results found for this basename: AMMONIA,  in the last 168 hours CBC:  Recent Labs Lab 12/02/13 2333  WBC 9.6  NEUTROABS 7.9*  HGB 10.8*  HCT 31.6*  MCV 78.4  PLT 287   Cardiac Enzymes: No results found for this basename: CKTOTAL, CKMB, CKMBINDEX, TROPONINI,  in the last 168 hours BNP: No components found with this basename: POCBNP,  CBG:  Recent Labs Lab 12/03/13 1137 12/03/13 1630 12/03/13 2137 12/04/13 0712 12/04/13 1208  GLUCAP 255* 181* 300* 242* 227*    Recent Results (from the past 240 hour(s))  CULTURE, BLOOD (ROUTINE X 2)     Status: None   Collection Time    11/24/13  3:56 PM      Result Value Ref Range Status   Specimen Description BLOOD RIGHT ANTECUBITAL   Final   Special Requests BOTTLES DRAWN AEROBIC AND ANAEROBIC 6CC   Final   Culture NO GROWTH 5 DAYS   Final   Report Status 11/29/2013 FINAL   Final  CULTURE, BLOOD (ROUTINE X 2)     Status: None   Collection Time    11/24/13  4:00 PM      Result Value Ref Range Status   Specimen Description BLOOD RIGHT HAND   Final   Special Requests     Final   Value: BOTTLES DRAWN AEROBIC AND  ANAEROBIC AEB=8CC ANA=6CC   Culture NO GROWTH 5 DAYS   Final   Report Status 11/29/2013 FINAL   Final  WOUND CULTURE     Status: None   Collection Time    11/25/13  3:24 PM      Result Value Ref Range Status   Specimen Description WOUND LEFT FOOT   Final   Special Requests NONE   Final   Gram Stain     Final   Value: FEW WBC PRESENT, PREDOMINANTLY PMN     RARE SQUAMOUS EPITHELIAL CELLS PRESENT     ABUNDANT GRAM VARIABLE ROD     RARE GRAM POSITIVE COCCI     IN CHAINS   Culture     Final   Value: FEW GROUP B STREP(S.AGALACTIAE)ISOLATED     Note: TESTING AGAINST S. AGALACTIAE NOT ROUTINELY PERFORMED DUE TO PREDICTABILITY OF AMP/PEN/VAN SUSCEPTIBILITY.     Performed at [facility]   Report Status 11/28/2013 FINAL   Final     Studies:              All Imaging reviewed and is as per above notation    Scheduled Meds: . amLODipine  5 mg Oral Daily  . collagenase   Topical BID  . docusate sodium  100 mg Oral BID  . escitalopram  10 mg Oral Daily  . folic acid  1 mg Oral Daily  . heparin  5,000 Units Subcutaneous 3 times per day  . insulin aspart  0-15 Units Subcutaneous TID WC  . insulin aspart  0-5 Units Subcutaneous QHS  . insulin aspart protamine- aspart  45 Units Subcutaneous Q breakfast  . lisinopril  20 mg Oral Daily  . OxyCODONE  40 mg Oral Q12H  . pantoprazole  40 mg Oral Daily  . piperacillin-tazobactam (ZOSYN)  IV  3.375 g Intravenous Q8H  . sodium chloride  10-40 mL Intracatheter Q12H  . thiamine  100 mg Oral Daily  . vancomycin  2,000 mg Intravenous Q12H   Continuous Infusions: . sodium chloride 125 mL (12/03/13 1404)     Assessment/Plan: 1. Chronic Osteomyelitis -continue vancomycin/Zosyn for now.  Continue IV fluids 100 cc per hour overnight. Will be reviewed over the next 24 hours. If stable will discharge to nursing home with a PICC line. Continue oxycodone 40 every 12 as well as when necessary coverage 2. Hypertension-amlodipine increased to 10 mg daily. Continue lisinopril 10 3. Depression continue Lexapro 10 mg daily 4. Diabetes mellitus-continue NPH insulin 45 units every morning and sliding scale coverage  Code Status: Full Family Communication: No bedside Disposition Plan: Inpatient   Verneita Griffes, MD  [facility] Pager [phone] 12/04/2013, 2:33 PM    LOS: 2 days

## 2013-12-05 ENCOUNTER — Inpatient Hospital Stay
Admission: RE | Admit: 2013-12-05 | Discharge: 2014-01-07 | Disposition: A | Discharge status: Home / Self Care | Payer: Medicare Other | Source: Ambulatory Visit | Attending: Internal Medicine | Admitting: Internal Medicine

## 2013-12-05 LAB — GLUCOSE, CAPILLARY
Glucose-Capillary: 273 mg/dL — ABNORMAL HIGH (ref 70–99)
Glucose-Capillary: 271 mg/dL — ABNORMAL HIGH (ref 70–99)
Glucose-Capillary: 219 mg/dL — ABNORMAL HIGH (ref 70–99)

## 2013-12-05 MED ORDER — OXYCODONE HCL 15 MG PO TABS: 15.0000 mg | ORAL_TABLET | ORAL | Status: DC | PRN

## 2013-12-05 MED ORDER — COLLAGENASE 250 UNIT/GM EX OINT
TOPICAL_OINTMENT | Freq: Two times a day (BID) | CUTANEOUS | Status: DC
Start: 2013-12-05 — End: 2014-02-28

## 2013-12-05 MED ORDER — INSULIN ASPART PROT & ASPART (70-30 MIX) 100 UNIT/ML ~~LOC~~ SUSP: 55.0000 [IU] | Freq: Every day | SUBCUTANEOUS | Status: DC

## 2013-12-05 MED ORDER — AMLODIPINE BESYLATE 10 MG PO TABS: 5.0000 mg | ORAL_TABLET | Freq: Every day | ORAL | Status: DC

## 2013-12-05 MED ORDER — PIPERACILLIN-TAZOBACTAM 3.375 G IVPB: 3.3750 g | Freq: Three times a day (TID) | INTRAVENOUS | Status: DC

## 2013-12-05 MED ORDER — SODIUM CHLORIDE 0.9 % IV SOLN: 2000.0000 mg | Freq: Two times a day (BID) | INTRAVENOUS | Status: DC

## 2013-12-05 MED ORDER — OXYCODONE HCL ER 40 MG PO T12A: 40.0000 mg | EXTENDED_RELEASE_TABLET | Freq: Two times a day (BID) | ORAL | Status: DC

## 2013-12-05 NOTE — Progress Notes (Signed)
Report called to [facility]. Patient being transferred with PICC line.

## 2013-12-05 NOTE — Discharge Summary (Signed)
Physician Discharge Summary  Christopher Raymond CWC:[id number] DOB: 16-Oct-1975 DOA: 12/02/2013  PCP: Jacqualine Code, DO  Admit date: 12/02/2013 Discharge date: 12/05/2013  Time spent: 20 minutes  Recommendations for Outpatient Follow-up:  1. Patient to continue IV vancomycin/IV Zosyn at low doses until 01/06/2014 which would complete 6 weeks of treatment for chronic osteomyelitis.  Prior to that please have patient be seen by Dr. Creig Hines of Gen Surg 2. Patient to be discharged to [facility] center 3. Get CBC/ESR/vancomycin level weekly 4. Needs a sentinel twice a day over the wound  Discharge Diagnoses:  Principal Problem:   Diabetic osteomyelitis Active Problems:   GERD (gastroesophageal reflux disease)   Essential hypertension, benign   Chronic low back pain   Cellulitis   Osteomyelitis   Discharge Condition: Fair  Diet recommendation: Diabetic  Filed Weights   12/02/13 2309 12/03/13 0347  Weight: 131.543 kg (290 lb) 131.543 kg (290 lb)    History of present illness:  38 year old white male with history of chronic osteomyelitis left foot, diabetes mellitus, readmitted to [facility] with LE swelling and signs of recurrent infection See below  Hospital Course:   1. Chronic Osteomyelitis -continue vancomycin/Zosyn until 01/06/14.  Found to be stable ? to nursing home with a PICC line. Continue oxycodone 40 every 12 as well as when necessary coverage with oral pain medications-Gen. surgery saw the patient and patient was not candidate for further repeat debridement as nothing else was noted to be able to be debrided-recommended to continue Santyl dressings twice a day.  Wound cultures grew nonspecific findings 2. Hypertension-amlodipine increased to 10 mg daily. Continue lisinopril 10 3. Depression continue Lexapro 10 mg daily 4. Diabetes mellitus-hold dose NPH insulin 45 units every morning was increased to 55 units on discharge-might need sliding scale coverage as an  outpatient and needs diabetic control counseling-blood sugars range between 250s and 300s during this hospital stay  Consultations:  General surgery  Discharge Exam: Filed Vitals:   12/05/13 0412  BP: 144/69  Pulse: 87  Temp: 98.1 F (36.7 C)  Resp: 18    General: Alert pleasant oriented Cardiovascular: S1-S2 normal no gallop Respiratory: Clear Erythema across front of foot with some tenderness however states to be localized now and has not spread  Discharge Instructions You were cared for by a hospitalist during your hospital stay. If you have any questions about your discharge medications or the care you received while you were in the hospital after you are discharged, you can call the unit and asked to speak with the hospitalist on call if the hospitalist that took care of you is not available. Once you are discharged, your primary care physician will handle any further medical issues. Please note that NO REFILLS for any discharge medications will be authorized once you are discharged, as it is imperative that you return to your primary care physician (or establish a relationship with a primary care physician if you do not have one) for your aftercare needs so that they can reassess your need for medications and monitor your lab values.  Discharge Instructions   Diet - low sodium heart healthy    Complete by:  As directed      Increase activity slowly    Complete by:  As directed             Medication List    STOP taking these medications       amoxicillin-clavulanate 875-125 MG per tablet  Commonly known as:  AUGMENTIN  TAKE these medications       amLODipine 10 MG tablet  Commonly known as:  NORVASC  Take 0.5 tablets (5 mg total) by mouth daily.     collagenase ointment  Commonly known as:  SANTYL  Apply topically 2 (two) times daily at 10 AM and 5 PM.     escitalopram 10 MG tablet  Commonly known as:  LEXAPRO  Take 1 tablet (10 mg total) by mouth daily.      esomeprazole 40 MG capsule  Commonly known as:  NEXIUM  Take 1 capsule (40 mg total) by mouth daily before breakfast.     folic acid 1 MG tablet  Commonly known as:  FOLVITE  Take 1 tablet (1 mg total) by mouth daily.     insulin aspart protamine- aspart (70-30) 100 UNIT/ML injection  Commonly known as:  NOVOLOG MIX 70/30  Inject 0.55 mLs (55 Units total) into the skin daily with breakfast.     insulin glargine 100 UNIT/ML injection  Commonly known as:  LANTUS  Inject 0.13 mLs (13 Units total) into the skin at bedtime.     lisinopril 20 MG tablet  Commonly known as:  PRINIVIL,ZESTRIL  Take 20 mg by mouth daily.     oxyCODONE 15 MG immediate release tablet  Commonly known as:  ROXICODONE  Take 1 tablet (15 mg total) by mouth every 4 (four) hours as needed for breakthrough pain.     OxyCODONE 40 mg T12a 12 hr tablet  Commonly known as:  OXYCONTIN  Take 1 tablet (40 mg total) by mouth every 12 (twelve) hours.     piperacillin-tazobactam 3.375 GM/50ML IVPB  Commonly known as:  ZOSYN  Inject 50 mLs (3.375 g total) into the vein every 8 (eight) hours.     vancomycin 2,000 mg in sodium chloride 0.9 % 500 mL  Inject 2,000 mg into the vein every 12 (twelve) hours.       Allergies  Allergen Reactions  . Levaquin [Levofloxacin] Rash      The results of significant diagnostics from this hospitalization (including imaging, microbiology, ancillary and laboratory) are listed below for reference.    Significant Diagnostic Studies: Dg Tibia/fibula Left  11/24/2013   CLINICAL DATA:  soft tissue edema  EXAM: LEFT TIBIA AND FIBULA - 2 VIEW  COMPARISON:  None.  FINDINGS: Frontal and lateral views were obtained. No fracture or dislocation. No abnormal periosteal reaction. Joint spaces appear intact.  IMPRESSION: No abnormality noted.   Electronically Signed   By: Lowella Grip M.D.   On: 11/24/2013 15:38   Mr Foot Left W Wo Contrast  11/25/2013   CLINICAL DATA:  Diabetic foot  ulcer. Pain, swelling and redness. History of foot surgery 10/05/2013.  EXAM: MRI OF THE LEFT FOREFOOT WITHOUT AND WITH CONTRAST  TECHNIQUE: Multiplanar, multisequence MR imaging was performed both before and after administration of intravenous contrast.  CONTRAST:  39m MULTIHANCE GADOBENATE DIMEGLUMINE 529 MG/ML IV SOLN  COMPARISON:  06/28/2013 and 09/29/2013.  FINDINGS: There is diffuse abnormal marrow edema in the distal aspect of the fifth metatarsal and also in the fifth proximal phalanx with intervening widening of the metatarsal phalangeal joint. There is a surrounding soft tissue abscess extending to 1 open wound on the dorsum of the foot. Findings consistent with septic arthritis and osteomyelitis. There is also severe surrounding cellulitis and myofasciitis.  IMPRESSION: MR findings consistent with septic arthritis involving the fifth metatarsal phalangeal joint with osteomyelitis involving the distal half of the fifth metatarsal  and the fifth proximal phalanx.  Soft tissue abscess surrounding the fifth metatarsal phalangeal joint and extending up to the open wound on the dorsum of the foot.  Significant and diffuse cellulitis and myofasciitis.   Electronically Signed   By: Kalman Jewels M.D.   On: 11/25/2013 10:59   Dg Foot Complete Left  12/03/2013   CLINICAL DATA:  Increased edema and pain in the foot. History of diabetes.  EXAM: LEFT FOOT - COMPLETE 3+ VIEW  COMPARISON:  MRI of the foot 11/25/2013. Plain films of the foot 11/24/2013.  FINDINGS: There is osteomyelitis of the fifth metatarsal and septic arthritis of the fifth MTP joint. Soft tissue swelling is present. Marked worsening of osseous destruction and joint space widening when compared with previous plain films.  IMPRESSION: Septic arthritis and osteomyelitis involving the fifth MTP joint and regional bones.   Electronically Signed   By: Rolla Flatten M.D.   On: 12/03/2013 01:58   Dg Foot Complete Left  11/24/2013   ADDENDUM REPORT:  11/24/2013 15:42  ADDENDUM: There is a subtle lucency in the distal fifth metatarsal on the oblique view. Potentially, a small area of osteomyelitis could present in this manner. If there is concern for osteomyelitis in this region, MR would be the imaging study of choice to further assess.   Electronically Signed   By: Lowella Grip M.D.   On: 11/24/2013 15:42   11/24/2013   CLINICAL DATA:  Soft tissue swelling ; recent laceration  EXAM: LEFT FOOT - COMPLETE 3+ VIEW  COMPARISON:  None.  FINDINGS: Frontal, oblique, and lateral views were obtained. There is generalized soft tissue edema. There is no fracture or dislocation. Joint spaces appear intact. No radiopaque foreign body. No erosive change. There is a minimal spur arising from the inferior calcaneus.  IMPRESSION: Generalized soft tissue swelling. No fracture or radiopaque foreign body. No appreciable arthropathy. Minimal calcaneal spur.  Electronically Signed: By: Lowella Grip M.D. On: 11/24/2013 15:36    Microbiology: Recent Results (from the past 240 hour(s))  WOUND CULTURE     Status: None   Collection Time    11/25/13  3:24 PM      Result Value Ref Range Status   Specimen Description WOUND LEFT FOOT   Final   Special Requests NONE   Final   Gram Stain     Final   Value: FEW WBC PRESENT, PREDOMINANTLY PMN     RARE SQUAMOUS EPITHELIAL CELLS PRESENT     ABUNDANT GRAM VARIABLE ROD     RARE GRAM POSITIVE COCCI     IN CHAINS   Culture     Final   Value: FEW GROUP B STREP(S.AGALACTIAE)ISOLATED     Note: TESTING AGAINST S. AGALACTIAE NOT ROUTINELY PERFORMED DUE TO PREDICTABILITY OF AMP/PEN/VAN SUSCEPTIBILITY.     Performed at [facility]   Report Status 11/28/2013 FINAL   Final     Labs: Basic Metabolic Panel:  Recent Labs Lab 12/03/13 0052 12/04/13 0500  NA 131* 136*  K 4.0 3.7  CL 93* 97  CO2 25 27  GLUCOSE 377* 295*  BUN 9 10  CREATININE 0.79 0.84  CALCIUM 8.9 8.2*   Liver Function Tests:  Recent  Labs Lab 12/03/13 0052  AST 10  ALT 9  ALKPHOS 80  BILITOT 0.3  PROT 7.1  ALBUMIN 3.2*   No results found for this basename: LIPASE, AMYLASE,  in the last 168 hours No results found for this basename: AMMONIA,  in the  last 168 hours CBC:  Recent Labs Lab 12/02/13 2333  WBC 9.6  NEUTROABS 7.9*  HGB 10.8*  HCT 31.6*  MCV 78.4  PLT 287   Cardiac Enzymes: No results found for this basename: CKTOTAL, CKMB, CKMBINDEX, TROPONINI,  in the last 168 hours BNP: BNP (last 3 results) No results found for this basename: PROBNP,  in the last 8760 hours CBG:  Recent Labs Lab 12/03/13 2137 12/04/13 0712 12/04/13 1208 12/04/13 2126 12/05/13 0716  GLUCAP 300* 242* 227* 273* 271*       Signed:  Nita Sells  [facility] 12/05/2013, 10:10 AM

## 2013-12-06 ENCOUNTER — Non-Acute Institutional Stay (SKILLED_NURSING_FACILITY): Payer: Medicare Other | Admitting: Internal Medicine

## 2013-12-06 DIAGNOSIS — I10 Essential (primary) hypertension: Secondary | ICD-10-CM

## 2013-12-06 DIAGNOSIS — E1169 Type 2 diabetes mellitus with other specified complication: Secondary | ICD-10-CM

## 2013-12-06 DIAGNOSIS — M869 Osteomyelitis, unspecified: Secondary | ICD-10-CM

## 2013-12-06 DIAGNOSIS — M9080 Osteopathy in diseases classified elsewhere, unspecified site: Secondary | ICD-10-CM

## 2013-12-06 DIAGNOSIS — M009 Pyogenic arthritis, unspecified: Secondary | ICD-10-CM

## 2013-12-06 LAB — GLUCOSE, CAPILLARY
GLUCOSE-CAPILLARY: 294 mg/dL — AB (ref 70–99)
Glucose-Capillary: 139 mg/dL — ABNORMAL HIGH (ref 70–99)
Glucose-Capillary: 237 mg/dL — ABNORMAL HIGH (ref 70–99)

## 2013-12-06 NOTE — Progress Notes (Signed)
Patient ID: Christopher Raymond, male   DOB: 01-28-1976, 38 y.o.   MRN: [id number]  Facility; [facility] Chief complaint; admission to SNF post admit to [facility] from 6/11 to 12/05/2013  History; this is a 38 year old man with a history of diabetes at this point I'm not completely certain whether this is type I or type II. He said he has been a diabetic for 13 years and was on insulin and pills when he initially started treatment. In any case he has had quite a bit of difficulty now with a diabetic foot infection involving the left foot. He has been followed by Dr. Arnoldo Morale for this and I believe had an I&D done in April. Cultures of this area on 4/8 grew a few staph aureus and group B strep, on 4/14 where group B strep, on 6/4 once again a few group B strep. 2 blood cultures have been negative.  His MRI will be listed below shows extensive infection including osteomyelitis and septic arthritis. The patient tells me he received 6 weeks of IV vancomycin and Zosyn while an inpatient at [facility]. He then was at home for a short period of time and then was readmitted to hospital on this occasion with increasing signs of recurrent infection including pain swelling. He was then placed back on vancomycin and Zosyn with instructions to run this until 01/06/14. The patient states he is in some pain but mostly from chronic lumbar pain from work injury some years ago from for which he is on disability . EXAM: MRI OF THE LEFT FOREFOOT WITHOUT AND WITH CONTRAST   TECHNIQUE: Multiplanar, multisequence MR imaging was performed both before and after administration of intravenous contrast.   CONTRAST:  32mL MULTIHANCE GADOBENATE DIMEGLUMINE 529 MG/ML IV SOLN   COMPARISON:  06/28/2013 and 09/29/2013.   FINDINGS: There is diffuse abnormal marrow edema in the distal aspect of the fifth metatarsal and also in the fifth proximal phalanx with intervening widening of the metatarsal phalangeal joint. There is a surrounding soft  tissue abscess extending to 1 open wound on the dorsum of the foot. Findings consistent with septic arthritis and osteomyelitis. There is also severe surrounding cellulitis and myofasciitis.   IMPRESSION: MR findings consistent with septic arthritis involving the fifth metatarsal phalangeal joint with osteomyelitis involving the distal half of the fifth metatarsal and the fifth proximal phalanx.   Soft tissue abscess surrounding the fifth metatarsal phalangeal joint and extending up to the open wound on the dorsum of the foot.   Significant and diffuse cellulitis and myofasciitis.     Electronically Signed   By: Kalman Jewels M.D.  Past Medical History  Diagnosis Date  . GERD (gastroesophageal reflux disease)   . Diabetes mellitus   . HTN (hypertension)   . Depression   . Anxiety   . Chronic pain   . ED (erectile dysfunction)   . Hyperlipidemia   . Diabetic foot ulcer    Past Surgical History  Procedure Laterality Date  . Knee surgery    . Back surgery    . Cholecystectomy  2009  . Tonsillectomy    . Cholecystectomy open    . Wound debridement Left 10/05/2013    Procedure: INCISION AND DEBRIDEMENT LEFT FOOT;  Surgeon: Jamesetta So, MD;  Location: [facility];  Service: General;  Laterality: Left;   Current Outpatient Prescriptions on File Prior to Visit  Medication Sig Dispense Refill  . amLODipine (NORVASC) 10 MG tablet Take 0.5 tablets (5 mg total) by  mouth daily.  31 tablet  0  . collagenase (SANTYL) ointment Apply topically 2 (two) times daily at 10 AM and 5 PM.  15 g  0  . escitalopram (LEXAPRO) 10 MG tablet Take 1 tablet (10 mg total) by mouth daily.  30 tablet  5  . esomeprazole (NEXIUM) 40 patient iMG capsule Take 1 capsule (40 mg total) by mouth daily before breakfast.  30 capsule  0  . folic acid (FOLVITE) 1 MG tablet Take 1 tablet (1 mg total) by mouth daily.  30 tablet  0  . insulin aspart protamine- aspart (NOVOLOG MIX 70/30) (70-30) 100 UNIT/ML injection  Inject 0.55 mLs (55 Units total) into the skin daily with breakfast.  10 mL  0  . insulin glargine (LANTUS) 100 UNIT/ML injection Inject 0.13 mLs (13 Units total) into the skin at bedtime.  10 mL  0  . lisinopril (PRINIVIL,ZESTRIL) 20 MG tablet Take 20 mg by mouth daily.      . OxyCODONE (OXYCONTIN) 40 mg T12A 12 hr tablet Take 1 tablet (40 mg total) by mouth every 12 (twelve) hours.  30 tablet  0  . oxyCODONE (ROXICODONE) 15 MG immediate release tablet Take 1 tablet (15 mg total) by mouth every 4 (four) hours as needed for breakthrough pain.  30 tablet  0  . piperacillin-tazobactam (ZOSYN) 3.375 GM/50ML IVPB Inject 50 mLs (3.375 g total) into the vein every 8 (eight) hours.  50 mL  0  . vancomycin 2,000 mg in sodium chloride 0.9 % 500 mL Inject 2,000 mg into the vein every 12 (twelve) hours.  2 g  0   No current facility-administered medications on file prior to visit.    Social; patient is on chronic disability secondary to low back pain from an injury suffered at work some years ago  reports that he has never smoked. His smokeless tobacco use includes Snuff. He reports that he drinks alcohol. He reports that he does not use illicit drugs.  indicated that his mother is alive. He indicated that his father is deceased.   Review of systems Gen. no fever no chills Respiratory no cough no sputum Cardiac no chest pain Musculoskeletal chronic low back pain however no history of any infections or other issues as far as he is aware Endocrine; hemoglobin A1c was 10.1 on 6/3  Physical examination Gen. patient is in no distress. Afebrile Cardiac heart sounds are normal no murmurs no gallops Abdomen no liver no spleen Lymph none palpable in the inguinal or popliteal areas Vascular peripheral pulses are briskly felt at the dorsalis pedis and popliteal Neurologic; reflexes are normal at the knee jerks Left foot; there is extensive infection here involving the left forefoot. Although the MRI makes  reference to most of the abnormalities being in the lateral foot, it would appear that the infection has extended across his foot there is also tenderness involving the second and third metatarsal phalangeal joints. More worrisome than this is there is warmth extending up the foot to above the ankle. The original surgical site is a very clean incision has some depth towards the lateral aspect to approximately 1 cm but the rest of this looks very healthy  Impression/plan #1 diabetic osteomyelitis and septic arthritis. I find this situation to be concerning. This is a limb threatening situation the patient seems aware of this. I will look at this again in 48 hours. Cultures of this are listed above and initially included staph aureus and group B strep.  Both  of which should be well covered by existing antibiotics. If he does not improve or worsens I would wonder about infectious disease and/or orthopedic consultation #2 likely type 1 diabetes currently on Lantus 13 units at bedtime and 70/30 55 units with breakfast. His hemoglobin A1c is high a little this could be driven by his underlying infection. #3 hypertension will monitor currently on Norvasc 10 mg a day and lisinopril #4 Wagner's 3 diabetic foot infection prednisone and no need for santyl right now, the wound  is clean better served by an alginate silver alginate. #5 gastroesophageal reflux disease on Nexium #6 depression seems stable at the moment on Lexapro   Results for Christopher Raymond, Christopher Raymond (MRN [id number]) as of 12/06/2013 08:25  Ref. Range 11/25/2013 15:24 11/26/2013 05:41 12/02/2013 23:33 12/03/2013 00:52 12/04/2013 05:00  Sodium Latest Range: 137-147 mEq/L  139  131 (L) 136 (L)  Potassium Latest Range: 3.7-5.3 mEq/L  5.1  4.0 3.7  Chloride Latest Range: 96-112 mEq/L  100  93 (L) 97  CO2 Latest Range: 19-32 mEq/L  30  25 27   BUN Latest Range: 6-23 mg/dL  9  9 10   Creatinine Latest Range: 0.50-1.35 mg/dL  0.70  0.79 0.84  Calcium Latest Range:  8.4-10.5 mg/dL  8.8  8.9 8.2 (L)  GFR calc non Af Amer Latest Range: >90 mL/min  >90  >90 >90  GFR calc Af Amer Latest Range: >90 mL/min  >90  >90 >90  Glucose Latest Range: 70-99 mg/dL  253 (H)  377 (H) 295 (H)  Alkaline Phosphatase Latest Range: 39-117 U/L    80   Albumin Latest Range: 3.5-5.2 g/dL    3.2 (L)   AST Latest Range: 0-37 U/L    10   ALT Latest Range: 0-53 U/L    9   Total Protein Latest Range: 6.0-8.3 g/dL    7.1   Total Bilirubin Latest Range: 0.3-1.2 mg/dL    0.3   WBC Latest Range: 4.0-10.5 K/uL  3.8 (L) 9.6    RBC Latest Range: 4.22-5.81 MIL/uL  3.74 (L) 4.03 (L)    Hemoglobin Latest Range: 13.0-17.0 g/dL  10.0 (L) 10.8 (L)    HCT Latest Range: 39.0-52.0 %  30.3 (L) 31.6 (L)    MCV Latest Range: 78.0-100.0 fL  81.0 78.4    MCH Latest Range: 26.0-34.0 pg  26.7 26.8    MCHC Latest Range: 30.0-36.0 g/dL  33.0 34.2    RDW Latest Range: 11.5-15.5 %  13.2 13.1    Platelets Latest Range: 150-400 K/uL  220 287    Neutrophils Relative % Latest Range: 43-77 %  54 83 (H)    Lymphocytes Relative Latest Range: 12-46 %  29 11 (L)    Monocytes Relative Latest Range: 3-12 %  9 5    Eosinophils Relative Latest Range: 0-5 %  7 (H) 1    Basophils Relative Latest Range: 0-1 %  1 0    NEUT# Latest Range: 1.7-7.7 K/uL  2.0 7.9 (H)    Lymphocytes Absolute Latest Range: 0.7-4.0 K/uL  1.1 1.1    Monocytes Absolute Latest Range: 0.1-1.0 K/uL  0.3 0.5    Eosinophils Absolute Latest Range: 0.0-0.7 K/uL  0.3 0.1    Basophils Absolute Latest Range: 0.0-0.1 K/uL  0.0 0.0    Sed Rate Latest Range: 0-16 mm/hr  40 (H)

## 2013-12-07 LAB — GLUCOSE, CAPILLARY
GLUCOSE-CAPILLARY: 223 mg/dL — AB (ref 70–99)
GLUCOSE-CAPILLARY: 272 mg/dL — AB (ref 70–99)
Glucose-Capillary: 219 mg/dL — ABNORMAL HIGH (ref 70–99)
Glucose-Capillary: 170 mg/dL — ABNORMAL HIGH (ref 70–99)

## 2013-12-08 ENCOUNTER — Non-Acute Institutional Stay (SKILLED_NURSING_FACILITY): Payer: Medicare Other | Admitting: Internal Medicine

## 2013-12-08 DIAGNOSIS — M869 Osteomyelitis, unspecified: Secondary | ICD-10-CM

## 2013-12-08 DIAGNOSIS — E1169 Type 2 diabetes mellitus with other specified complication: Secondary | ICD-10-CM

## 2013-12-08 DIAGNOSIS — M009 Pyogenic arthritis, unspecified: Secondary | ICD-10-CM

## 2013-12-08 DIAGNOSIS — M9080 Osteopathy in diseases classified elsewhere, unspecified site: Secondary | ICD-10-CM

## 2013-12-08 LAB — GLUCOSE, CAPILLARY
GLUCOSE-CAPILLARY: 246 mg/dL — AB (ref 70–99)
GLUCOSE-CAPILLARY: 276 mg/dL — AB (ref 70–99)
Glucose-Capillary: 340 mg/dL — ABNORMAL HIGH (ref 70–99)
Glucose-Capillary: 349 mg/dL — ABNORMAL HIGH (ref 70–99)
Glucose-Capillary: 198 mg/dL — ABNORMAL HIGH (ref 70–99)
Glucose-Capillary: 258 mg/dL — ABNORMAL HIGH (ref 70–99)

## 2013-12-09 LAB — GLUCOSE, CAPILLARY
GLUCOSE-CAPILLARY: 230 mg/dL — AB (ref 70–99)
Glucose-Capillary: 224 mg/dL — ABNORMAL HIGH (ref 70–99)
Glucose-Capillary: 292 mg/dL — ABNORMAL HIGH (ref 70–99)

## 2013-12-10 ENCOUNTER — Other Ambulatory Visit: Payer: Self-pay | Admitting: *Deleted

## 2013-12-10 LAB — GLUCOSE, CAPILLARY
GLUCOSE-CAPILLARY: 286 mg/dL — AB (ref 70–99)
GLUCOSE-CAPILLARY: 340 mg/dL — AB (ref 70–99)
Glucose-Capillary: 290 mg/dL — ABNORMAL HIGH (ref 70–99)
Glucose-Capillary: 298 mg/dL — ABNORMAL HIGH (ref 70–99)

## 2013-12-10 MED ORDER — OXYCODONE HCL 15 MG PO TABS: ORAL_TABLET | ORAL | Status: DC

## 2013-12-10 NOTE — Telephone Encounter (Signed)
[facility] 

## 2013-12-11 LAB — GLUCOSE, CAPILLARY
GLUCOSE-CAPILLARY: 284 mg/dL — AB (ref 70–99)
GLUCOSE-CAPILLARY: 361 mg/dL — AB (ref 70–99)

## 2013-12-12 LAB — GLUCOSE, CAPILLARY
GLUCOSE-CAPILLARY: 159 mg/dL — AB (ref 70–99)
Glucose-Capillary: 198 mg/dL — ABNORMAL HIGH (ref 70–99)
Glucose-Capillary: 175 mg/dL — ABNORMAL HIGH (ref 70–99)

## 2013-12-13 LAB — GLUCOSE, CAPILLARY
GLUCOSE-CAPILLARY: 257 mg/dL — AB (ref 70–99)
GLUCOSE-CAPILLARY: 313 mg/dL — AB (ref 70–99)
Glucose-Capillary: 295 mg/dL — ABNORMAL HIGH (ref 70–99)
Glucose-Capillary: 358 mg/dL — ABNORMAL HIGH (ref 70–99)

## 2013-12-14 LAB — GLUCOSE, CAPILLARY: Glucose-Capillary: 240 mg/dL — ABNORMAL HIGH (ref 70–99)

## 2013-12-15 LAB — GLUCOSE, CAPILLARY
GLUCOSE-CAPILLARY: 369 mg/dL — AB (ref 70–99)
GLUCOSE-CAPILLARY: 352 mg/dL — AB (ref 70–99)
Glucose-Capillary: 350 mg/dL — ABNORMAL HIGH (ref 70–99)
Glucose-Capillary: 371 mg/dL — ABNORMAL HIGH (ref 70–99)
Glucose-Capillary: 263 mg/dL — ABNORMAL HIGH (ref 70–99)
Glucose-Capillary: 304 mg/dL — ABNORMAL HIGH (ref 70–99)

## 2013-12-16 ENCOUNTER — Other Ambulatory Visit: Payer: Self-pay | Admitting: *Deleted

## 2013-12-16 LAB — GLUCOSE, CAPILLARY
GLUCOSE-CAPILLARY: 323 mg/dL — AB (ref 70–99)
GLUCOSE-CAPILLARY: 238 mg/dL — AB (ref 70–99)
Glucose-Capillary: 239 mg/dL — ABNORMAL HIGH (ref 70–99)
Glucose-Capillary: 314 mg/dL — ABNORMAL HIGH (ref 70–99)

## 2013-12-16 MED ORDER — OXYCODONE HCL ER 40 MG PO T12A: EXTENDED_RELEASE_TABLET | ORAL | Status: DC

## 2013-12-16 NOTE — Progress Notes (Addendum)
Patient ID: Christopher Raymond, male   DOB: 11-30-1975, 38 y.o.   MRN: [id number]                 PROGRESS NOTE  DATE:  12/08/2013    FACILITY: [facility]    LEVEL OF CARE:   SNF   Acute Visit   HISTORY OF PRESENT ILLNESS:  This is a gentleman whom I admitted to the building two days ago.    He is a diabetic.   I am not completely certain which type.  However, he has developed a diabetic foot infection involving the left foot.  He had an I&D done in April.  Cultures then grew a few Staph aureus and group B strep.  He had a full course of vancomycin and Zosyn, I believe.  However, he ultimately went home from another nursing home and then readmitted with increasing infection.  MRI showed findings consistent with septic arthritis involving the fifth metatarsophalangeal joint and osteomyelitis involving the distal half of the fifth metatarsal and the fifth proximal phalanx with soft tissue involvement, as well.     I was concerned about him the other day due to intense erythema in the left forefoot and some warmth extending above the ankle.  He does not experience pain.  I suspect he is neuropathic.  Although his knee jerk is maintained on the left, I do not have a tuning fork or a filament test available.  In any case, I am seeing him today in follow-up.    LABORATORY DATA:  Lab work shows a white count of 4.7.  His hemoglobin is 10.7.  Differential count is normal.    Sedimentation rate is 55.  This is up from 40 on 11/26/2013 and 45 on 10/08/2013.  This is somewhat concerning.    PHYSICAL EXAMINATION:   SKIN:  INSPECTION:  Left foot:  In spite of the sedimentation rate, the degree of erythema here is considerably better.  His original surgical wound on the dorsal aspect of his foot looks clean and is well on its way to improving.  There is less warmth in the foot, ankle, and lower leg.   I am really gratified to see the improvement, albeit the slight increase in his sedimentation  rate.    ASSESSMENT/PLAN:  Diabetic osteomyelitis.  He appears to be improving, which is gratifying.  The foot looks better.  The degree of warmth is better.  I suspect he probably has diabetic neuropathy.    Type 2 diabetes with neuropathy.  His blood sugars are consistently above 200 and even above 300 later in the day.   His hemoglobin A1c was over 10 when last checked in the hospital.  He is on, I believe, his dose of insulin at home which is Lantus 13 U at bedtime and 70/30 insulin 55 U in the morning.  I think he is going to need more insulin, specifically the Lantus at night.  I will probably increase him to 20 U.  This could be driven by his underlying foot infection, although the hemoglobin A1c would suggest otherwise.

## 2013-12-16 NOTE — Telephone Encounter (Signed)
[facility] 

## 2013-12-17 LAB — GLUCOSE, CAPILLARY
GLUCOSE-CAPILLARY: 122 mg/dL — AB (ref 70–99)
GLUCOSE-CAPILLARY: 272 mg/dL — AB (ref 70–99)
Glucose-Capillary: 301 mg/dL — ABNORMAL HIGH (ref 70–99)

## 2013-12-18 LAB — GLUCOSE, CAPILLARY
GLUCOSE-CAPILLARY: 302 mg/dL — AB (ref 70–99)
Glucose-Capillary: 275 mg/dL — ABNORMAL HIGH (ref 70–99)
Glucose-Capillary: 314 mg/dL — ABNORMAL HIGH (ref 70–99)

## 2013-12-19 LAB — GLUCOSE, CAPILLARY
GLUCOSE-CAPILLARY: 221 mg/dL — AB (ref 70–99)
Glucose-Capillary: 366 mg/dL — ABNORMAL HIGH (ref 70–99)
Glucose-Capillary: 289 mg/dL — ABNORMAL HIGH (ref 70–99)
Glucose-Capillary: 296 mg/dL — ABNORMAL HIGH (ref 70–99)

## 2013-12-20 LAB — GLUCOSE, CAPILLARY
GLUCOSE-CAPILLARY: 262 mg/dL — AB (ref 70–99)
GLUCOSE-CAPILLARY: 263 mg/dL — AB (ref 70–99)
GLUCOSE-CAPILLARY: 362 mg/dL — AB (ref 70–99)
Glucose-Capillary: 230 mg/dL — ABNORMAL HIGH (ref 70–99)

## 2013-12-21 LAB — GLUCOSE, CAPILLARY
GLUCOSE-CAPILLARY: 174 mg/dL — AB (ref 70–99)
Glucose-Capillary: 269 mg/dL — ABNORMAL HIGH (ref 70–99)
Glucose-Capillary: 305 mg/dL — ABNORMAL HIGH (ref 70–99)

## 2013-12-22 ENCOUNTER — Non-Acute Institutional Stay (SKILLED_NURSING_FACILITY): Payer: Medicare Other | Admitting: Internal Medicine

## 2013-12-22 DIAGNOSIS — M869 Osteomyelitis, unspecified: Secondary | ICD-10-CM

## 2013-12-22 DIAGNOSIS — E1169 Type 2 diabetes mellitus with other specified complication: Secondary | ICD-10-CM

## 2013-12-22 DIAGNOSIS — M9080 Osteopathy in diseases classified elsewhere, unspecified site: Secondary | ICD-10-CM

## 2013-12-22 LAB — GLUCOSE, CAPILLARY
GLUCOSE-CAPILLARY: 215 mg/dL — AB (ref 70–99)
Glucose-Capillary: 208 mg/dL — ABNORMAL HIGH (ref 70–99)
Glucose-Capillary: 111 mg/dL — ABNORMAL HIGH (ref 70–99)
Glucose-Capillary: 360 mg/dL — ABNORMAL HIGH (ref 70–99)

## 2013-12-23 LAB — GLUCOSE, CAPILLARY
GLUCOSE-CAPILLARY: 356 mg/dL — AB (ref 70–99)
Glucose-Capillary: 304 mg/dL — ABNORMAL HIGH (ref 70–99)
Glucose-Capillary: 115 mg/dL — ABNORMAL HIGH (ref 70–99)

## 2013-12-24 LAB — GLUCOSE, CAPILLARY
GLUCOSE-CAPILLARY: 288 mg/dL — AB (ref 70–99)
GLUCOSE-CAPILLARY: 94 mg/dL (ref 70–99)
GLUCOSE-CAPILLARY: 341 mg/dL — AB (ref 70–99)

## 2013-12-25 LAB — GLUCOSE, CAPILLARY
Glucose-Capillary: 264 mg/dL — ABNORMAL HIGH (ref 70–99)
Glucose-Capillary: 208 mg/dL — ABNORMAL HIGH (ref 70–99)
Glucose-Capillary: 432 mg/dL — ABNORMAL HIGH (ref 70–99)

## 2013-12-26 LAB — GLUCOSE, CAPILLARY
GLUCOSE-CAPILLARY: 202 mg/dL — AB (ref 70–99)
GLUCOSE-CAPILLARY: 387 mg/dL — AB (ref 70–99)
GLUCOSE-CAPILLARY: 345 mg/dL — AB (ref 70–99)
Glucose-Capillary: 250 mg/dL — ABNORMAL HIGH (ref 70–99)
Glucose-Capillary: 314 mg/dL — ABNORMAL HIGH (ref 70–99)
Glucose-Capillary: 310 mg/dL — ABNORMAL HIGH (ref 70–99)

## 2013-12-27 LAB — GLUCOSE, CAPILLARY
GLUCOSE-CAPILLARY: 213 mg/dL — AB (ref 70–99)
GLUCOSE-CAPILLARY: 237 mg/dL — AB (ref 70–99)
Glucose-Capillary: 117 mg/dL — ABNORMAL HIGH (ref 70–99)
Glucose-Capillary: 234 mg/dL — ABNORMAL HIGH (ref 70–99)
Glucose-Capillary: 184 mg/dL — ABNORMAL HIGH (ref 70–99)

## 2013-12-28 LAB — GLUCOSE, CAPILLARY
GLUCOSE-CAPILLARY: 116 mg/dL — AB (ref 70–99)
Glucose-Capillary: 247 mg/dL — ABNORMAL HIGH (ref 70–99)
Glucose-Capillary: 107 mg/dL — ABNORMAL HIGH (ref 70–99)
Glucose-Capillary: 217 mg/dL — ABNORMAL HIGH (ref 70–99)

## 2013-12-29 LAB — GLUCOSE, CAPILLARY
GLUCOSE-CAPILLARY: 290 mg/dL — AB (ref 70–99)
Glucose-Capillary: 314 mg/dL — ABNORMAL HIGH (ref 70–99)
Glucose-Capillary: 195 mg/dL — ABNORMAL HIGH (ref 70–99)
Glucose-Capillary: 126 mg/dL — ABNORMAL HIGH (ref 70–99)
Glucose-Capillary: 297 mg/dL — ABNORMAL HIGH (ref 70–99)
Glucose-Capillary: 417 mg/dL — ABNORMAL HIGH (ref 70–99)

## 2013-12-29 NOTE — Progress Notes (Addendum)
Patient ID: Christopher Raymond, male   DOB: 02-12-1976, 38 y.o.   MRN: [id number]                  PROGRESS NOTE  DATE:  12/22/2013    FACILITY: [facility]    LEVEL OF CARE:   SNF   Acute Visit    CHIEF COMPLAINT:  Review of left foot infection.    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  He had an I&D done by Dr. Arnoldo Morale in April that grew a few Staph aureus and group B strep.  He was treated with vanc and Zosyn.  He went to a nursing home, went home, and then was readmitted with the same extensive infection including osteomyelitis and septic arthritis of the fifth metatarsophalangeal joint, osteomyelitis involving the distal half of the fifth metatarsal and the fifth proximal phalanx.    When he arrived in the nursing home here, he actually had an extensive infection up the anterior aspect of his foot to above the ankle.  I was very concerned about limb loss here.  Fortunately for the patient, this has gotten dramatically better.  The surgical incision site is closed.  He still has a small open area on the medial aspect of the surgical incision.  However, I do not think we need Aquacel AG to this, in any case.  There may be some small erythema around the wound.  However, there is no overt infection here any longer.  His last ESR was 18.    With regards to his diabetes, he is on 70/30 insulin 55 U with breakfast.  At one point, he appears to have been on Lantus 13 U at bedtime although I do not see this on his current medication list.  His blood sugars have been consistently elevated all day.  Yesterday:  239/174/218/305.    PHYSICAL EXAMINATION:   SKIN:  INSPECTION:  Left foot:  As mentioned, the surgical incision here has almost completely resolved.  There is a very tiny open area in the medial aspect of it that looks like it is closing.  There may be some scant erythema around the  wound, although all of this is considerably better.   There is no pain, no reproducible tenderness over the fifth metatarsal joint.  There is no effusion that is obvious.    ASSESSMENT/PLAN:  Diabetic foot infection.  I think he probably has diabetic neuropathy.  Nevertheless, this is considerably better.  He remains on vanc and Zosyn probably for another three weeks or so.    Poorly controlled diabetes.  I am not completely certain of what type this is.  He is on Lantus 22 U at bedtime and 70/30, 55 U once a day.  The 70/30 insulin is keeping his blood sugars in the 200s.  I am going to increase the Lantus to 30 U at night.

## 2013-12-30 LAB — GLUCOSE, CAPILLARY
GLUCOSE-CAPILLARY: 285 mg/dL — AB (ref 70–99)
Glucose-Capillary: 164 mg/dL — ABNORMAL HIGH (ref 70–99)
Glucose-Capillary: 372 mg/dL — ABNORMAL HIGH (ref 70–99)

## 2013-12-31 LAB — GLUCOSE, CAPILLARY
Glucose-Capillary: 160 mg/dL — ABNORMAL HIGH (ref 70–99)
Glucose-Capillary: 137 mg/dL — ABNORMAL HIGH (ref 70–99)
Glucose-Capillary: 347 mg/dL — ABNORMAL HIGH (ref 70–99)

## 2014-01-01 LAB — GLUCOSE, CAPILLARY
GLUCOSE-CAPILLARY: 222 mg/dL — AB (ref 70–99)
GLUCOSE-CAPILLARY: 161 mg/dL — AB (ref 70–99)
GLUCOSE-CAPILLARY: 303 mg/dL — AB (ref 70–99)
GLUCOSE-CAPILLARY: 318 mg/dL — AB (ref 70–99)
GLUCOSE-CAPILLARY: 261 mg/dL — AB (ref 70–99)

## 2014-01-02 LAB — GLUCOSE, CAPILLARY
Glucose-Capillary: 218 mg/dL — ABNORMAL HIGH (ref 70–99)
Glucose-Capillary: 71 mg/dL (ref 70–99)
Glucose-Capillary: 229 mg/dL — ABNORMAL HIGH (ref 70–99)
Glucose-Capillary: 240 mg/dL — ABNORMAL HIGH (ref 70–99)

## 2014-01-03 ENCOUNTER — Other Ambulatory Visit: Payer: Self-pay | Admitting: *Deleted

## 2014-01-03 LAB — GLUCOSE, CAPILLARY
GLUCOSE-CAPILLARY: 133 mg/dL — AB (ref 70–99)
Glucose-Capillary: 152 mg/dL — ABNORMAL HIGH (ref 70–99)
Glucose-Capillary: 234 mg/dL — ABNORMAL HIGH (ref 70–99)
Glucose-Capillary: 314 mg/dL — ABNORMAL HIGH (ref 70–99)

## 2014-01-03 MED ORDER — OXYCODONE HCL ER 40 MG PO T12A: EXTENDED_RELEASE_TABLET | ORAL | Status: DC

## 2014-01-03 NOTE — Telephone Encounter (Signed)
[facility] 

## 2014-01-04 LAB — GLUCOSE, CAPILLARY
GLUCOSE-CAPILLARY: 156 mg/dL — AB (ref 70–99)
GLUCOSE-CAPILLARY: 274 mg/dL — AB (ref 70–99)
Glucose-Capillary: 193 mg/dL — ABNORMAL HIGH (ref 70–99)
Glucose-Capillary: 247 mg/dL — ABNORMAL HIGH (ref 70–99)

## 2014-01-05 LAB — GLUCOSE, CAPILLARY
GLUCOSE-CAPILLARY: 261 mg/dL — AB (ref 70–99)
Glucose-Capillary: 205 mg/dL — ABNORMAL HIGH (ref 70–99)
Glucose-Capillary: 125 mg/dL — ABNORMAL HIGH (ref 70–99)
Glucose-Capillary: 186 mg/dL — ABNORMAL HIGH (ref 70–99)

## 2014-01-06 LAB — GLUCOSE, CAPILLARY
Glucose-Capillary: 316 mg/dL — ABNORMAL HIGH (ref 70–99)
Glucose-Capillary: 211 mg/dL — ABNORMAL HIGH (ref 70–99)
Glucose-Capillary: 380 mg/dL — ABNORMAL HIGH (ref 70–99)

## 2014-01-07 LAB — GLUCOSE, CAPILLARY: Glucose-Capillary: 271 mg/dL — ABNORMAL HIGH (ref 70–99)

## 2014-01-09 LAB — GLUCOSE, CAPILLARY: Glucose-Capillary: 122 mg/dL — ABNORMAL HIGH (ref 70–99)

## 2014-01-10 LAB — GLUCOSE, CAPILLARY: Glucose-Capillary: 175 mg/dL — ABNORMAL HIGH (ref 70–99)

## 2014-01-26 ENCOUNTER — Encounter (HOSPITAL_COMMUNITY): Payer: Self-pay | Admitting: Emergency Medicine

## 2014-01-26 ENCOUNTER — Emergency Department (HOSPITAL_COMMUNITY)
Admission: EM | Admit: 2014-01-26 | Discharge: 2014-01-26 | Disposition: A | Discharge status: Home / Self Care | Payer: Medicare Other | Attending: Emergency Medicine | Admitting: Emergency Medicine

## 2014-01-26 DIAGNOSIS — Z794 Long term (current) use of insulin: Secondary | ICD-10-CM | POA: Insufficient documentation

## 2014-01-26 DIAGNOSIS — Z792 Long term (current) use of antibiotics: Secondary | ICD-10-CM | POA: Diagnosis not present

## 2014-01-26 DIAGNOSIS — F329 Major depressive disorder, single episode, unspecified: Secondary | ICD-10-CM | POA: Diagnosis not present

## 2014-01-26 DIAGNOSIS — F411 Generalized anxiety disorder: Secondary | ICD-10-CM | POA: Diagnosis not present

## 2014-01-26 DIAGNOSIS — K219 Gastro-esophageal reflux disease without esophagitis: Secondary | ICD-10-CM | POA: Insufficient documentation

## 2014-01-26 DIAGNOSIS — F3289 Other specified depressive episodes: Secondary | ICD-10-CM | POA: Insufficient documentation

## 2014-01-26 DIAGNOSIS — Z872 Personal history of diseases of the skin and subcutaneous tissue: Secondary | ICD-10-CM | POA: Insufficient documentation

## 2014-01-26 DIAGNOSIS — M5412 Radiculopathy, cervical region: Secondary | ICD-10-CM | POA: Insufficient documentation

## 2014-01-26 DIAGNOSIS — E119 Type 2 diabetes mellitus without complications: Secondary | ICD-10-CM | POA: Insufficient documentation

## 2014-01-26 DIAGNOSIS — I10 Essential (primary) hypertension: Secondary | ICD-10-CM | POA: Insufficient documentation

## 2014-01-26 DIAGNOSIS — Z79899 Other long term (current) drug therapy: Secondary | ICD-10-CM | POA: Diagnosis not present

## 2014-01-26 DIAGNOSIS — R51 Headache: Secondary | ICD-10-CM | POA: Diagnosis not present

## 2014-01-26 DIAGNOSIS — R519 Headache, unspecified: Secondary | ICD-10-CM

## 2014-01-26 DIAGNOSIS — Z87448 Personal history of other diseases of urinary system: Secondary | ICD-10-CM | POA: Insufficient documentation

## 2014-01-26 MED ORDER — KETOROLAC TROMETHAMINE 60 MG/2ML IM SOLN
60.0000 mg | Freq: Once | INTRAMUSCULAR | Status: AC
  Administered 2014-01-26: 60 mg via INTRAMUSCULAR
  Filled 2014-01-26: qty 2

## 2014-01-26 MED ORDER — OXYCODONE-ACETAMINOPHEN 5-325 MG PO TABS
1.0000 | ORAL_TABLET | Freq: Once | ORAL | Status: AC
  Administered 2014-01-26: 1 via ORAL
  Filled 2014-01-26: qty 1

## 2014-01-26 MED ORDER — OXYCODONE-ACETAMINOPHEN 5-325 MG PO TABS: 1.0000 | ORAL_TABLET | Freq: Once | ORAL | Status: DC

## 2014-01-26 NOTE — Discharge Instructions (Signed)

## 2014-01-26 NOTE — ED Notes (Signed)
Complaining of headache, radiating into shoulders, onset 45 min ago, woke pt up

## 2014-01-26 NOTE — ED Provider Notes (Signed)
CSN: [id number]     Arrival date & time 01/26/14  0243 History   First MD Initiated Contact with Patient 01/26/14 0300     Chief Complaint  Patient presents with  . Headache      HPI Patient reports he developed a headache and pain radiating from his neck down his left shoulder and into his fourth and fifth digits of his left hand.  He states he's never really had symptoms like this before.  He denies weakness of his arms or legs.  No recent injury or trauma.  No prior history of significant headaches.  No recent heavy lifting or strenuous activity.  The patient is on OxyContin 40 mg extended release and 15 mg oxycodone immediate release for breakthrough pain.  He states when his pain became severe this morning he took 2 Tylenol and came to the ER.  No chest pain or shortness of breath.  No palpitations.  Past Medical History  Diagnosis Date  . GERD (gastroesophageal reflux disease)   . Diabetes mellitus   . HTN (hypertension)   . Depression   . Anxiety   . Chronic pain   . ED (erectile dysfunction)   . Hyperlipidemia   . Diabetic foot ulcer    Past Surgical History  Procedure Laterality Date  . Knee surgery    . Back surgery    . Cholecystectomy  2009  . Tonsillectomy    . Cholecystectomy open    . Wound debridement Left 10/05/2013    Procedure: INCISION AND DEBRIDEMENT LEFT FOOT;  Surgeon: Jamesetta So, MD;  Location: [facility];  Service: General;  Laterality: Left;   Family History  Problem Relation Age of Onset  . Coronary artery disease Father   . Heart attack Father   . Hypertension Mother    History  Substance Use Topics  . Smoking status: Never Smoker   . Smokeless tobacco: Current User    Types: Snuff     Comment: dip x 20  . Alcohol Use: Yes     Comment: very little 4-5 beers/yr    Review of Systems  All other systems reviewed and are negative.     Allergies  Levaquin  Home Medications   Prior to Admission medications   Medication Sig Start Date  End Date Taking? Authorizing Provider  amLODipine (NORVASC) 10 MG tablet Take 0.5 tablets (5 mg total) by mouth daily. 12/05/13   Nita Sells, MD  collagenase (SANTYL) ointment Apply topically 2 (two) times daily at 10 AM and 5 PM. 12/05/13   Nita Sells, MD  escitalopram (LEXAPRO) 10 MG tablet Take 1 tablet (10 mg total) by mouth daily. 10/23/12   Mikey Kirschner, MD  esomeprazole (NEXIUM) 40 MG capsule Take 1 capsule (40 mg total) by mouth daily before breakfast. 03/30/13   Julianne Rice, MD  folic acid (FOLVITE) 1 MG tablet Take 1 tablet (1 mg total) by mouth daily. 11/26/13   Eugenie Filler, MD  insulin aspart protamine- aspart (NOVOLOG MIX 70/30) (70-30) 100 UNIT/ML injection Inject 0.55 mLs (55 Units total) into the skin daily with breakfast. 12/05/13   Nita Sells, MD  insulin glargine (LANTUS) 100 UNIT/ML injection Inject 0.13 mLs (13 Units total) into the skin at bedtime. 11/26/13   Eugenie Filler, MD  lisinopril (PRINIVIL,ZESTRIL) 20 MG tablet Take 20 mg by mouth daily.    Historical Provider, MD  OxyCODONE (OXYCONTIN) 40 mg T12A 12 hr tablet Take one tablet by mouth every 12 hours for  pain. Do not crush 01/03/14   Tiffany L Reed, DO  oxyCODONE (ROXICODONE) 15 MG immediate release tablet Take one tablet by mouth every 4 hours as needed for breakthrough pain 12/10/13   Pricilla Larsson, NP  piperacillin-tazobactam (ZOSYN) 3.375 GM/50ML IVPB Inject 50 mLs (3.375 g total) into the vein every 8 (eight) hours. 12/05/13   Nita Sells, MD  vancomycin 2,000 mg in sodium chloride 0.9 % 500 mL Inject 2,000 mg into the vein every 12 (twelve) hours. 12/05/13   Nita Sells, MD   BP 136/96  Pulse 100  Temp(Src) 98.5 F (36.9 C) (Oral)  Resp 20  Ht 6\' 1"  (1.854 m)  Wt 280 lb (127.007 kg)  BMI 36.95 kg/m2  SpO2 100% Physical Exam  Nursing note and vitals reviewed. Constitutional: He is oriented to person, place, and time. He appears well-developed and  well-nourished.  HENT:  Head: Normocephalic and atraumatic.  Eyes: EOM are normal.  Neck: Normal range of motion.  C-spine nontender  Cardiovascular: Normal rate, regular rhythm, normal heart sounds and intact distal pulses.   Pulmonary/Chest: Effort normal and breath sounds normal. No respiratory distress.  Abdominal: Soft. He exhibits no distension. There is no tenderness.  Musculoskeletal: Normal range of motion.  5 out of 5 strength in bilateral upper lower extremity major muscle groups.  Normal left radial pulse  Neurological: He is alert and oriented to person, place, and time.  Skin: Skin is warm and dry.  Psychiatric: He has a normal mood and affect. Judgment normal.    ED Course  Procedures (including critical care time) Labs Review Labs Reviewed - No data to display  Imaging Review No results found.   EKG Interpretation None      MDM   Final diagnoses:  Cervical radiculopathy  Nonintractable headache, unspecified chronicity pattern, unspecified headache type    This appears to be cervical radiculopathy.  I'm still somewhat confused as to why the patient did not try one of his 15 mg Roxicodone prior to presenting to the ER.  I don't believe the patient is a subarachnoid hemorrhage.  I suspect the majority of his headache is more tension related from his cervical radiculopathy.  He has no weakness at this time.  No indication for advanced imaging.  He told me he had never had symptoms like this before however review the medical record demonstrates cervical radiculopathy diagnosis in November of 2014.  The history that was obtained at that time stated the patient is intermittently had neck pain and cervical symptoms for years.    Hoy Morn, MD 01/26/14 [phone]

## 2014-02-28 ENCOUNTER — Inpatient Hospital Stay (HOSPITAL_COMMUNITY)
Admission: EM | Admit: 2014-02-28 | Discharge: 2014-02-28 | DRG: 638 | Disposition: A | Discharge status: Home / Self Care | Payer: Medicare Other | Attending: Family Medicine | Admitting: Family Medicine

## 2014-02-28 ENCOUNTER — Encounter (HOSPITAL_COMMUNITY): Payer: Self-pay | Admitting: Emergency Medicine

## 2014-02-28 ENCOUNTER — Emergency Department (HOSPITAL_COMMUNITY): Payer: Medicare Other

## 2014-02-28 DIAGNOSIS — E119 Type 2 diabetes mellitus without complications: Secondary | ICD-10-CM | POA: Diagnosis present

## 2014-02-28 DIAGNOSIS — Z794 Long term (current) use of insulin: Secondary | ICD-10-CM

## 2014-02-28 DIAGNOSIS — Z8249 Family history of ischemic heart disease and other diseases of the circulatory system: Secondary | ICD-10-CM | POA: Diagnosis not present

## 2014-02-28 DIAGNOSIS — M545 Low back pain, unspecified: Secondary | ICD-10-CM | POA: Diagnosis present

## 2014-02-28 DIAGNOSIS — M9080 Osteopathy in diseases classified elsewhere, unspecified site: Secondary | ICD-10-CM | POA: Diagnosis present

## 2014-02-28 DIAGNOSIS — L03317 Cellulitis of buttock: Secondary | ICD-10-CM

## 2014-02-28 DIAGNOSIS — F3289 Other specified depressive episodes: Secondary | ICD-10-CM | POA: Diagnosis present

## 2014-02-28 DIAGNOSIS — E1169 Type 2 diabetes mellitus with other specified complication: Secondary | ICD-10-CM | POA: Diagnosis present

## 2014-02-28 DIAGNOSIS — L0231 Cutaneous abscess of buttock: Secondary | ICD-10-CM | POA: Diagnosis present

## 2014-02-28 DIAGNOSIS — D638 Anemia in other chronic diseases classified elsewhere: Secondary | ICD-10-CM | POA: Diagnosis present

## 2014-02-28 DIAGNOSIS — F411 Generalized anxiety disorder: Secondary | ICD-10-CM | POA: Diagnosis present

## 2014-02-28 DIAGNOSIS — G8929 Other chronic pain: Secondary | ICD-10-CM | POA: Diagnosis present

## 2014-02-28 DIAGNOSIS — F17200 Nicotine dependence, unspecified, uncomplicated: Secondary | ICD-10-CM | POA: Diagnosis present

## 2014-02-28 DIAGNOSIS — L0591 Pilonidal cyst without abscess: Secondary | ICD-10-CM | POA: Diagnosis present

## 2014-02-28 DIAGNOSIS — K219 Gastro-esophageal reflux disease without esophagitis: Secondary | ICD-10-CM | POA: Diagnosis present

## 2014-02-28 DIAGNOSIS — M79609 Pain in unspecified limb: Secondary | ICD-10-CM | POA: Diagnosis present

## 2014-02-28 DIAGNOSIS — I10 Essential (primary) hypertension: Secondary | ICD-10-CM | POA: Diagnosis present

## 2014-02-28 DIAGNOSIS — L0501 Pilonidal cyst with abscess: Secondary | ICD-10-CM

## 2014-02-28 DIAGNOSIS — F329 Major depressive disorder, single episode, unspecified: Secondary | ICD-10-CM | POA: Diagnosis present

## 2014-02-28 DIAGNOSIS — M869 Osteomyelitis, unspecified: Secondary | ICD-10-CM | POA: Diagnosis present

## 2014-02-28 DIAGNOSIS — M86679 Other chronic osteomyelitis, unspecified ankle and foot: Secondary | ICD-10-CM | POA: Diagnosis present

## 2014-02-28 DIAGNOSIS — E785 Hyperlipidemia, unspecified: Secondary | ICD-10-CM | POA: Diagnosis present

## 2014-02-28 DIAGNOSIS — L089 Local infection of the skin and subcutaneous tissue, unspecified: Secondary | ICD-10-CM

## 2014-02-28 DIAGNOSIS — E11628 Type 2 diabetes mellitus with other skin complications: Secondary | ICD-10-CM

## 2014-02-28 LAB — GLUCOSE, CAPILLARY: GLUCOSE-CAPILLARY: 240 mg/dL — AB (ref 70–99)

## 2014-02-28 LAB — COMPREHENSIVE METABOLIC PANEL
ALT: 13 U/L (ref 0–53)
AST: 9 U/L (ref 0–37)
Albumin: 3.3 g/dL — ABNORMAL LOW (ref 3.5–5.2)
Alkaline Phosphatase: 81 U/L (ref 39–117)
Anion gap: 11 (ref 5–15)
BUN: 8 mg/dL (ref 6–23)
CALCIUM: 8.9 mg/dL (ref 8.4–10.5)
CO2: 27 mEq/L (ref 19–32)
Chloride: 94 mEq/L — ABNORMAL LOW (ref 96–112)
Creatinine, Ser: 0.78 mg/dL (ref 0.50–1.35)
GFR calc Af Amer: >90 mL/min (ref >90)
GLUCOSE: 369 mg/dL — AB (ref 70–99)
POTASSIUM: 3.9 meq/L (ref 3.7–5.3)
SODIUM: 132 meq/L — AB (ref 137–147)
Total Bilirubin: 0.3 mg/dL (ref 0.3–1.2)
Total Protein: 7.1 g/dL (ref 6.0–8.3)

## 2014-02-28 LAB — CBC WITH DIFFERENTIAL/PLATELET
Basophils Absolute: 0 10*3/uL (ref 0.0–0.1)
Basophils Relative: 0 % (ref 0–1)
EOS PCT: 2 % (ref 0–5)
Eosinophils Absolute: 0.2 10*3/uL (ref 0.0–0.7)
HCT: 33.2 % — ABNORMAL LOW (ref 39.0–52.0)
Hemoglobin: 11.4 g/dL — ABNORMAL LOW (ref 13.0–17.0)
LYMPHS ABS: 0.8 10*3/uL (ref 0.7–4.0)
Lymphocytes Relative: 11 % — ABNORMAL LOW (ref 12–46)
MCH: 26.7 pg (ref 26.0–34.0)
MCHC: 34.3 g/dL (ref 30.0–36.0)
MCV: 77.8 fL — AB (ref 78.0–100.0)
Monocytes Absolute: 0.5 10*3/uL (ref 0.1–1.0)
Monocytes Relative: 6 % (ref 3–12)
Neutro Abs: 6.1 10*3/uL (ref 1.7–7.7)
Neutrophils Relative %: 81 % — ABNORMAL HIGH (ref 43–77)
Platelets: 225 10*3/uL (ref 150–400)
RBC: 4.27 MIL/uL (ref 4.22–5.81)
RDW: 14.1 % (ref 11.5–15.5)
WBC: 7.6 10*3/uL (ref 4.0–10.5)

## 2014-02-28 MED ORDER — PANTOPRAZOLE SODIUM 40 MG PO TBEC
40.0000 mg | DELAYED_RELEASE_TABLET | Freq: Every day | ORAL | Status: DC
  Administered 2014-02-28: 40 mg via ORAL
  Filled 2014-02-28: qty 1

## 2014-02-28 MED ORDER — VANCOMYCIN HCL IN DEXTROSE 1-5 GM/200ML-% IV SOLN
1000.0000 mg | Freq: Three times a day (TID) | INTRAVENOUS | Status: DC
  Filled 2014-02-28 (×4): qty 200

## 2014-02-28 MED ORDER — OXYCODONE HCL 5 MG PO TABS
15.0000 mg | ORAL_TABLET | ORAL | Status: DC | PRN
  Administered 2014-02-28: 15 mg via ORAL
  Filled 2014-02-28: qty 3

## 2014-02-28 MED ORDER — INSULIN GLARGINE 100 UNIT/ML ~~LOC~~ SOLN
20.0000 [IU] | Freq: Every day | SUBCUTANEOUS | Status: DC
  Administered 2014-02-28: 20 [IU] via SUBCUTANEOUS
  Filled 2014-02-28 (×2): qty 0.2

## 2014-02-28 MED ORDER — INSULIN ASPART 100 UNIT/ML ~~LOC~~ SOLN
0.0000 [IU] | Freq: Three times a day (TID) | SUBCUTANEOUS | Status: DC
  Administered 2014-02-28: 08:00:00 via SUBCUTANEOUS

## 2014-02-28 MED ORDER — OXYCODONE-ACETAMINOPHEN 5-325 MG PO TABS
1.0000 | ORAL_TABLET | ORAL | Status: DC | PRN
  Administered 2014-02-28: 1 via ORAL
  Filled 2014-02-28: qty 1

## 2014-02-28 MED ORDER — SODIUM CHLORIDE 0.9 % IJ SOLN: 3.0000 mL | INTRAMUSCULAR | Status: DC | PRN

## 2014-02-28 MED ORDER — SODIUM CHLORIDE 0.9 % IJ SOLN
3.0000 mL | Freq: Two times a day (BID) | INTRAMUSCULAR | Status: DC
  Administered 2014-02-28: 3 mL via INTRAVENOUS

## 2014-02-28 MED ORDER — PIPERACILLIN-TAZOBACTAM 3.375 G IVPB 30 MIN
3.3750 g | Freq: Once | INTRAVENOUS | Status: AC
  Administered 2014-02-28: 3.375 g via INTRAVENOUS
  Filled 2014-02-28: qty 50

## 2014-02-28 MED ORDER — ESCITALOPRAM OXALATE 10 MG PO TABS
10.0000 mg | ORAL_TABLET | Freq: Every day | ORAL | Status: DC
  Administered 2014-02-28: 10 mg via ORAL
  Filled 2014-02-28: qty 1

## 2014-02-28 MED ORDER — PIPERACILLIN-TAZOBACTAM 3.375 G IVPB
3.3750 g | Freq: Three times a day (TID) | INTRAVENOUS | Status: DC
  Administered 2014-02-28: 3.375 g via INTRAVENOUS
  Filled 2014-02-28 (×5): qty 50

## 2014-02-28 MED ORDER — SODIUM CHLORIDE 0.9 % IV SOLN
INTRAVENOUS | Status: DC
  Administered 2014-02-28: 05:00:00 via INTRAVENOUS

## 2014-02-28 MED ORDER — DOXYCYCLINE HYCLATE 100 MG PO TABS
100.0000 mg | ORAL_TABLET | Freq: Two times a day (BID) | ORAL | Status: DC
  Administered 2014-02-28: 100 mg via ORAL
  Filled 2014-02-28: qty 1

## 2014-02-28 MED ORDER — LIDOCAINE HCL (PF) 1 % IJ SOLN
INTRAMUSCULAR | Status: AC
  Administered 2014-02-28: 04:00:00
  Filled 2014-02-28: qty 5

## 2014-02-28 MED ORDER — ENOXAPARIN SODIUM 60 MG/0.6ML ~~LOC~~ SOLN
60.0000 mg | SUBCUTANEOUS | Status: DC
  Administered 2014-02-28: 60 mg via SUBCUTANEOUS
  Filled 2014-02-28: qty 0.6

## 2014-02-28 MED ORDER — LISINOPRIL 10 MG PO TABS
20.0000 mg | ORAL_TABLET | Freq: Every day | ORAL | Status: DC
  Administered 2014-02-28: 20 mg via ORAL
  Filled 2014-02-28: qty 2

## 2014-02-28 MED ORDER — VANCOMYCIN HCL 10 G IV SOLR
1250.0000 mg | Freq: Two times a day (BID) | INTRAVENOUS | Status: DC
  Filled 2014-02-28: qty 1250

## 2014-02-28 MED ORDER — SODIUM CHLORIDE 0.9 % IV BOLUS (SEPSIS)
1000.0000 mL | Freq: Once | INTRAVENOUS | Status: AC
  Administered 2014-02-28: 1000 mL via INTRAVENOUS

## 2014-02-28 MED ORDER — INSULIN ASPART 100 UNIT/ML IV SOLN
10.0000 [IU] | Freq: Once | INTRAVENOUS | Status: AC
  Administered 2014-02-28: 10 [IU] via INTRAVENOUS

## 2014-02-28 MED ORDER — SODIUM CHLORIDE 0.9 % IV SOLN: 250.0000 mL | INTRAVENOUS | Status: DC | PRN

## 2014-02-28 MED ORDER — MORPHINE SULFATE 4 MG/ML IJ SOLN
4.0000 mg | Freq: Once | INTRAMUSCULAR | Status: AC
  Administered 2014-02-28: 4 mg via INTRAVENOUS
  Filled 2014-02-28: qty 1

## 2014-02-28 MED ORDER — AMLODIPINE BESYLATE 5 MG PO TABS
5.0000 mg | ORAL_TABLET | Freq: Every day | ORAL | Status: DC
  Administered 2014-02-28: 5 mg via ORAL
  Filled 2014-02-28: qty 1

## 2014-02-28 MED ORDER — VANCOMYCIN HCL IN DEXTROSE 1-5 GM/200ML-% IV SOLN
1000.0000 mg | Freq: Once | INTRAVENOUS | Status: AC
  Administered 2014-02-28: 1000 mg via INTRAVENOUS
  Filled 2014-02-28: qty 200

## 2014-02-28 MED ORDER — DOXYCYCLINE HYCLATE 100 MG PO TABS: 100.0000 mg | ORAL_TABLET | Freq: Two times a day (BID) | ORAL | Status: DC

## 2014-02-28 NOTE — Progress Notes (Signed)
Patient discharged to home, discharge instruction explained and verbalized understanding. IV removed.

## 2014-02-28 NOTE — Care Management Utilization Note (Signed)
UR Chart Review Completed  

## 2014-02-28 NOTE — ED Notes (Signed)
Pt noticed a swelling to top of buttock that has started to break open.

## 2014-02-28 NOTE — Progress Notes (Signed)
Pt c/o pain medication not relieving pain. Pt takes 20 mg Roxicet @ home. Ordered 5mg  now. Paged Dr Sarajane Jews regarding the 5mg  not relieving pain. Made day shift nurse aware.

## 2014-02-28 NOTE — Consult Note (Signed)
Reason for Consult: Osteomyelitis of left foot Referring Physician: Hospitalist  Christopher Raymond is an 38 y.o. male.  HPI: Patient is a 38 year old white male status post incision and debridement of his left foot in April of 2015 who presented emergency room primarily with a boil in the right buttock region. An x-ray was done of his left foot which reveals osteomyelitis of the left fifth toe. He states he has done well since the debridement in April. He is followed by a primary care physician in [place]. The wound has healed over. He denies any significant left foot pain or significant drainage. He is primarily concerned about his boil on his buttock and wants to be discharged if possible.  Past Medical History  Diagnosis Date  . GERD (gastroesophageal reflux disease)   . Diabetes mellitus   . HTN (hypertension)   . Depression   . Anxiety   . Chronic pain   . ED (erectile dysfunction)   . Hyperlipidemia   . Diabetic foot ulcer     Past Surgical History  Procedure Laterality Date  . Knee surgery    . Back surgery    . Cholecystectomy  2009  . Tonsillectomy    . Cholecystectomy open    . Wound debridement Left 10/05/2013    Procedure: INCISION AND DEBRIDEMENT LEFT FOOT;  Surgeon: Jamesetta So, MD;  Location: [facility];  Service: General;  Laterality: Left;    Family History  Problem Relation Age of Onset  . Coronary artery disease Father   . Heart attack Father   . Hypertension Mother     Social History:  reports that he has never smoked. His smokeless tobacco use includes Snuff. He reports that he drinks alcohol. He reports that he does not use illicit drugs.  Allergies:  Allergies  Allergen Reactions  . Levaquin [Levofloxacin] Rash    Medications: I have reviewed the patient's current medications.  Results for orders placed during the hospital encounter of 02/28/14 (from the past 48 hour(s))  CBC WITH DIFFERENTIAL     Status: Abnormal   Collection Time   02/28/14  2:49 AM      Result Value Ref Range   WBC 7.6  4.0 - 10.5 K/uL   RBC 4.27  4.22 - 5.81 MIL/uL   Hemoglobin 11.4 (*) 13.0 - 17.0 g/dL   HCT 33.2 (*) 39.0 - 52.0 %   MCV 77.8 (*) 78.0 - 100.0 fL   MCH 26.7  26.0 - 34.0 pg   MCHC 34.3  30.0 - 36.0 g/dL   RDW 14.1  11.5 - 15.5 %   Platelets 225  150 - 400 K/uL   Neutrophils Relative % 81 (*) 43 - 77 %   Neutro Abs 6.1  1.7 - 7.7 K/uL   Lymphocytes Relative 11 (*) 12 - 46 %   Lymphs Abs 0.8  0.7 - 4.0 K/uL   Monocytes Relative 6  3 - 12 %   Monocytes Absolute 0.5  0.1 - 1.0 K/uL   Eosinophils Relative 2  0 - 5 %   Eosinophils Absolute 0.2  0.0 - 0.7 K/uL   Basophils Relative 0  0 - 1 %   Basophils Absolute 0.0  0.0 - 0.1 K/uL  COMPREHENSIVE METABOLIC PANEL     Status: Abnormal   Collection Time    02/28/14  2:49 AM      Result Value Ref Range   Sodium 132 (*) 137 - 147 mEq/L   Potassium 3.9  3.7 - 5.3 mEq/L   Chloride 94 (*) 96 - 112 mEq/L   CO2 27  19 - 32 mEq/L   Glucose, Bld 369 (*) 70 - 99 mg/dL   BUN 8  6 - 23 mg/dL   Creatinine, Ser 0.78  0.50 - 1.35 mg/dL   Calcium 8.9  8.4 - 10.5 mg/dL   Total Protein 7.1  6.0 - 8.3 g/dL   Albumin 3.3 (*) 3.5 - 5.2 g/dL   AST 9  0 - 37 U/L   ALT 13  0 - 53 U/L   Alkaline Phosphatase 81  39 - 117 U/L   Total Bilirubin 0.3  0.3 - 1.2 mg/dL   GFR calc non Af Amer >90  >90 mL/min   GFR calc Af Amer >90  >90 mL/min   Comment: (NOTE)     The eGFR has been calculated using the CKD EPI equation.     This calculation has not been validated in all clinical situations.     eGFR's persistently <90 mL/min signify possible Chronic Kidney     Disease.   Anion gap 11  5 - 15  GLUCOSE, CAPILLARY     Status: Abnormal   Collection Time    02/28/14  7:39 AM      Result Value Ref Range   Glucose-Capillary 240 (*) 70 - 99 mg/dL   Comment 1 Notify RN     Comment 2 Documented in Chart      Dg Foot Complete Left  02/28/2014   CLINICAL DATA:  Sore on the top of the left foot.  EXAM: LEFT  FOOT - COMPLETE 3+ VIEW  COMPARISON:  12/03/2013  FINDINGS: Since the previous study, there is progression of bone erosion involving the distal left fifth metatarsal bone consistent with progressive osteomyelitis. Associated soft tissue swelling. Left foot appears otherwise intact.  IMPRESSION: Progressive bone erosion in the distal left fifth metatarsal bone consistent with progression of osteomyelitis.   Electronically Signed   By: Lucienne Capers M.D.   On: 02/28/2014 03:30    ROS: See chart Blood pressure 150/85, pulse 92, temperature 99.2 F (37.3 C), temperature source Oral, resp. rate 20, height 6' 1"  (1.854 m), weight 124.739 kg (275 lb), SpO2 100.00%. Physical Exam: Pleasant white male no acute distress. Extremity examination reveals a healed scaly left foot surgical scar. No significant swelling, erythema, or drainage is noted. No open ulcerations were seen. His foot looks much better than when I last saw him. The dorsalis pedis pulse was noted. Sensation is intact. A small indurated area with some drainage is noted in the pilonidal cyst region, just to the right. No fluctuance is noted.  Assessment/Plan: Impression: Known history of osteomyelitis of left foot, doing remarkably well. No need for surgical intervention. Christopher Raymond, right buttock, may be related to a pilonidal cyst, drainage. No need for surgical intervention. Plan: Patient was told to keep the oil wound clean and dry. I would suggest a 7 to ten-day course of Bactrim or tobramycin. He was instructed to followup with his primary care doctor as needed. This was discussed with Dr. Sarajane Jews.  Christopher Raymond A 02/28/2014, 9:29 AM

## 2014-02-28 NOTE — Progress Notes (Signed)
PROGRESS NOTE  Christopher Raymond YIR:[id number] DOB: [birth year] DOA: 02/28/2014 PCP: Jacqualine Code, DO  Summary: 38 year old man with history of diabetes presented with complaint of abscess on buttocks. He underwent incision and drainage in the emergency department. Also complained of some foot pain x-rays suggested osteomyelitis so he was admitted for surgical evaluation. He was seen in consultation with general surgery who is recommended discharge home on oral antibiotics and outpatient followup with his primary care physician. The patient has a history of osteomyelitis in this area pending this is a reasonable approach as the x-ray findings are likely to be chronic.  Assessment/Plan: 1. Left fifth toe chronic osteomyelitis. No indication for surgical intervention per surgery. Followup as an outpatient. 2. Buttock abscess, possibly related to pilonidal cyst. No indication for surgical intervention. 3. Diabetes mellitus. Stable. 4. Chronic normocytic anemia. Stable.   Plan discharge home today. Patient to keep wound clean and dry. Course of oral antibiotics recommended.   I discussed the radiographic findings with the patient that suggest ongoing infection in his left foot in the bone and recommended outpatient followup. He understood.  PCP out of the office today, will contact 9/8 to discuss  Christopher Hodgkins, MD  [facility]  Pager [phone] If 7PM-7AM, please contact night-coverage at [email or web address], password [email or web address] 02/28/2014, 9:50 AM  LOS: 0 days   Consultants:  General surgery  Procedures:    Antibiotics:    HPI/Subjective: Evaluated by surgery, discharge on oral abx recommended.  Feeling well. He would like to go home. He has had some pain in his left foot but nothing unusual.  Objective: Filed Vitals:   02/28/14 0131 02/28/14 0441 02/28/14 0612  BP: 135/88 150/94 150/85  Pulse: 110 100 92  Temp: 99.2 F (37.3 C)  99.2 F (37.3 C)  TempSrc: Oral  Oral    Resp: 20 22 20   Height: 6\' 1"  (1.854 m)    Weight: 124.739 kg (275 lb)    SpO2: 98% 100% 100%   No intake or output data in the 24 hours ending 02/28/14 0950   Filed Weights   02/28/14 0131  Weight: 124.739 kg (275 lb)    Exam:     Afebrile, vital signs stable. No hypoxia. Gen. Appears calm, comfortable.  Psych. Alert. Speech fluent and clear.  Cardiovascular. Regular rate and rhythm. No murmur, rub or gallop. No lower extremity edema.  Respiratory. Clear to auscultation bilaterally. No wheezes, rales or rhonchi. Normal respiratory effort.  Skin. Left foot with some scarring on dorsal surface the area of the fourth and fifth toe. There is no ulcer or drainage. No swelling.  Data Reviewed:  Capillary blood sugars stable.  BMP unremarkable, Na+ 132   x-ray left foot did show a distal left fifth metatarsal bone bruise and consistent with osteomyelitis.  Scheduled Meds: . sodium chloride   Intravenous STAT  . amLODipine  5 mg Oral Daily  . enoxaparin (LOVENOX) injection  60 mg Subcutaneous Q24H  . escitalopram  10 mg Oral Daily  . insulin aspart  0-9 Units Subcutaneous TID WC  . insulin glargine  20 Units Subcutaneous Daily  . lisinopril  20 mg Oral Daily  . pantoprazole  40 mg Oral Daily  . piperacillin-tazobactam (ZOSYN)  IV  3.375 g Intravenous 3 times per day  . sodium chloride  3 mL Intravenous Q12H  . vancomycin  1,000 mg Intravenous Q8H   Continuous Infusions:   Principal Problem:   Osteomyelitis Active Problems:   Essential hypertension, benign  Diabetes   Chronic low back pain   Pilonidal cyst

## 2014-02-28 NOTE — ED Provider Notes (Signed)
CSN: [id number]     Arrival date & time 02/28/14  0122 History   First MD Initiated Contact with Patient 02/28/14 0132     Chief Complaint  Patient presents with  . Abscess     (Consider location/radiation/quality/duration/timing/severity/associated sxs/prior Treatment) HPI Patient presents with an abscess to the upper buttock. This is at the present for one to 2 days. He's had some mild nausea and elevated blood glucose. He complains of pain over the site. This is had previous abscesses never in this location. No fever or chills.  Patient also has had osteomyelitis of the left foot. He has been on IV antibiotics in the past. He states that over the last few days he's noticed that he's had increased pain to the dorsum of the foot. He also complains of a foul smell. Past Medical History  Diagnosis Date  . GERD (gastroesophageal reflux disease)   . Diabetes mellitus   . HTN (hypertension)   . Depression   . Anxiety   . Chronic pain   . ED (erectile dysfunction)   . Hyperlipidemia   . Diabetic foot ulcer    Past Surgical History  Procedure Laterality Date  . Knee surgery    . Back surgery    . Cholecystectomy  2009  . Tonsillectomy    . Cholecystectomy open    . Wound debridement Left 10/05/2013    Procedure: INCISION AND DEBRIDEMENT LEFT FOOT;  Surgeon: Jamesetta So, MD;  Location: [facility];  Service: General;  Laterality: Left;   Family History  Problem Relation Age of Onset  . Coronary artery disease Father   . Heart attack Father   . Hypertension Mother    History  Substance Use Topics  . Smoking status: Never Smoker   . Smokeless tobacco: Current User    Types: Snuff     Comment: dip x 20  . Alcohol Use: Yes     Comment: very little 4-5 beers/yr    Review of Systems  Constitutional: Negative for fever and chills.  Respiratory: Negative for shortness of breath.   Cardiovascular: Negative for chest pain.  Gastrointestinal: Positive for nausea. Negative for  vomiting, abdominal pain and diarrhea.  Musculoskeletal: Negative for back pain, myalgias, neck pain and neck stiffness.  Skin: Positive for wound.  Neurological: Negative for dizziness, weakness, light-headedness, numbness and headaches.  All other systems reviewed and are negative.     Allergies  Levaquin  Home Medications   Prior to Admission medications   Medication Sig Start Date End Date Taking? Authorizing Provider  amLODipine (NORVASC) 10 MG tablet Take 0.5 tablets (5 mg total) by mouth daily. 12/05/13  Yes Nita Sells, MD  escitalopram (LEXAPRO) 10 MG tablet Take 1 tablet (10 mg total) by mouth daily. 10/23/12  Yes Mikey Kirschner, MD  esomeprazole (NEXIUM) 40 MG capsule Take 1 capsule (40 mg total) by mouth daily before breakfast. 03/30/13  Yes Julianne Rice, MD  insulin aspart protamine- aspart (NOVOLOG MIX 70/30) (70-30) 100 UNIT/ML injection Inject 0.55 mLs (55 Units total) into the skin daily with breakfast. 12/05/13  Yes Nita Sells, MD  insulin glargine (LANTUS) 100 UNIT/ML injection Inject 0.13 mLs (13 Units total) into the skin at bedtime. 11/26/13  Yes Eugenie Filler, MD  lisinopril (PRINIVIL,ZESTRIL) 20 MG tablet Take 20 mg by mouth daily.   Yes Historical Provider, MD  oxyCODONE (ROXICODONE) 15 MG immediate release tablet Take one tablet by mouth every 4 hours as needed for breakthrough pain 12/10/13  Yes Pricilla Larsson, NP  collagenase (SANTYL) ointment Apply topically 2 (two) times daily at 10 AM and 5 PM. 12/05/13   Nita Sells, MD  folic acid (FOLVITE) 1 MG tablet Take 1 tablet (1 mg total) by mouth daily. 11/26/13   Eugenie Filler, MD  OxyCODONE (OXYCONTIN) 40 mg T12A 12 hr tablet Take one tablet by mouth every 12 hours for pain. Do not crush 01/03/14   Tiffany L Reed, DO  piperacillin-tazobactam (ZOSYN) 3.375 GM/50ML IVPB Inject 50 mLs (3.375 g total) into the vein every 8 (eight) hours. 12/05/13   Nita Sells, MD  vancomycin 2,000 mg  in sodium chloride 0.9 % 500 mL Inject 2,000 mg into the vein every 12 (twelve) hours. 12/05/13   Nita Sells, MD   BP 135/88  Pulse 110  Temp(Src) 99.2 F (37.3 C) (Oral)  Resp 20  Ht 6\' 1"  (1.854 m)  Wt 275 lb (124.739 kg)  BMI 36.29 kg/m2  SpO2 98% Physical Exam  Nursing note and vitals reviewed. Constitutional: He is oriented to person, place, and time. He appears well-developed and well-nourished. No distress.  HENT:  Head: Normocephalic and atraumatic.  Mouth/Throat: Oropharynx is clear and moist.  Eyes: EOM are normal. Pupils are equal, round, and reactive to light.  Neck: Normal range of motion. Neck supple.  Cardiovascular: Normal rate and regular rhythm.   Pulmonary/Chest: Effort normal and breath sounds normal. No respiratory distress. He has no wheezes. He has no rales.  Abdominal: Soft. Bowel sounds are normal. He exhibits no distension and no mass. There is no tenderness. There is no rebound and no guarding.  Musculoskeletal: Normal range of motion. He exhibits no edema and no tenderness.  Neurological: He is alert and oriented to person, place, and time.  Skin: Skin is warm and dry. No rash noted. No erythema.  Patient has an area of induration at the superior end of the intergluteal cleft. Questionable fluctuance. Tender to palpation. Warmth. Roughly 4-5 cm in diameter.   Well-healed surgical scar on the dorsum of the left foot. There is no obvious erythema or discharge. Mild swelling to the entire foot. Palpation over the dorsum of the foot. Distal pulses are intact as well as good cap refill.  Psychiatric: He has a normal mood and affect. His behavior is normal.    ED Course  INCISION AND DRAINAGE Date/Time: 02/28/2014 3:41 AM Performed by: Julianne Rice Authorized by: Lita Mains, Grahm Etsitty Type: abscess Body area: anogenital Location details: pilonidal Local anesthetic: lidocaine 1% without epinephrine Anesthetic total: 4 ml Scalpel size: 11 Incision  type: single straight Complexity: simple Drainage amount: scant Wound treatment: wound left open Patient tolerance: Patient tolerated the procedure well with no immediate complications.   (including critical care time) Labs Review Labs Reviewed  CBC WITH DIFFERENTIAL - Abnormal; Notable for the following:    Hemoglobin 11.4 (*)    HCT 33.2 (*)    MCV 77.8 (*)    Neutrophils Relative % 81 (*)    Lymphocytes Relative 11 (*)    All other components within normal limits  COMPREHENSIVE METABOLIC PANEL - Abnormal; Notable for the following:    Sodium 132 (*)    Chloride 94 (*)    Glucose, Bld 369 (*)    Albumin 3.3 (*)    All other components within normal limits  C-REACTIVE PROTEIN    Imaging Review Dg Foot Complete Left  02/28/2014   CLINICAL DATA:  Sore on the top of the left foot.  EXAM:  LEFT FOOT - COMPLETE 3+ VIEW  COMPARISON:  12/03/2013  FINDINGS: Since the previous study, there is progression of bone erosion involving the distal left fifth metatarsal bone consistent with progressive osteomyelitis. Associated soft tissue swelling. Left foot appears otherwise intact.  IMPRESSION: Progressive bone erosion in the distal left fifth metatarsal bone consistent with progression of osteomyelitis.   Electronically Signed   By: Lucienne Capers M.D.   On: 02/28/2014 03:30     EKG Interpretation None      MDM   Final diagnoses:  None    X-ray with evidence of osteomyelitis progression. Discussed with Dr. Shanon Brow. Will admit to medical bed. IV vancomycin and Zosyn started in the emergency department.  Julianne Rice, MD 02/28/14 [phone]

## 2014-02-28 NOTE — Care Management Note (Signed)
    Page 1 of 1   02/28/2014     4:31:12 PM CARE MANAGEMENT NOTE 02/28/2014  Patient:  YADIR, ZENTNER   Account Number:  [id number]  Date Initiated:  02/28/2014  Documentation initiated by:  Vladimir Creeks  Subjective/Objective Assessment:   admitted with osteomyelitis left foot and abscess with I&D in the ED on buttocks. patient is from home is independent and will return home at discharge     Action/Plan:   no needs identified   Anticipated DC Date:  02/28/2014   Anticipated DC Plan:  [place]  CM consult      Choice offered to / List presented to:             Status of service:  Completed, signed off Medicare Important Message given?   (If response is "NO", the following Medicare IM given date fields will be blank) Date Medicare IM given:   Medicare IM given by:   Date Additional Medicare IM given:   Additional Medicare IM given by:    Discharge Disposition:  HOME/SELF CARE  Per UR Regulation:  Reviewed for med. necessity/level of care/duration of stay  If discussed at [place] of Stay Meetings, dates discussed:    Comments:  02/28/14 [place] RN/CM

## 2014-02-28 NOTE — Progress Notes (Signed)
ANTIBIOTIC CONSULT NOTE - follow up  Pharmacy Consult for vancomycin & Zosyn Indication: open wound (pilonidal cyst) & osteomyelitis of left foot  Allergies  Allergen Reactions  . Levaquin [Levofloxacin] Rash   Patient Measurements: Height: 6\' 1"  (185.4 cm) Weight: 275 lb (124.739 kg) IBW/kg (Calculated) : 79.9 Adjusted Body Weight: 95 kg  Vital Signs: Temp: 99.2 F (37.3 C) (09/07 0612) Temp src: Oral (09/07 0612) BP: 150/85 mmHg (09/07 0612) Pulse Rate: 92 (09/07 0612) Intake/Output from previous day:   Intake/Output from this shift:    Labs:  Recent Labs  02/28/14 0249  WBC 7.6  HGB 11.4*  PLT 225  CREATININE 0.78   Microbiology: No results found for this or any previous visit (from the past 720 hour(s)).  Medical History: Past Medical History  Diagnosis Date  . GERD (gastroesophageal reflux disease)   . Diabetes mellitus   . HTN (hypertension)   . Depression   . Anxiety   . Chronic pain   . ED (erectile dysfunction)   . Hyperlipidemia   . Diabetic foot ulcer    Medications:  Scheduled:  . sodium chloride   Intravenous STAT  . amLODipine  5 mg Oral Daily  . enoxaparin (LOVENOX) injection  60 mg Subcutaneous Q24H  . escitalopram  10 mg Oral Daily  . insulin aspart  0-9 Units Subcutaneous TID WC  . insulin glargine  20 Units Subcutaneous Daily  . lisinopril  20 mg Oral Daily  . pantoprazole  40 mg Oral Daily  . piperacillin-tazobactam (ZOSYN)  IV  3.375 g Intravenous 3 times per day  . sodium chloride  3 mL Intravenous Q12H  . vancomycin  1,000 mg Intravenous Q8H   Anti-infectives   Start     Dose/Rate Route Frequency Ordered Stop   02/28/14 1400  vancomycin (VANCOCIN) IVPB 1000 mg/200 mL premix     1,000 mg 200 mL/hr over 60 Minutes Intravenous Every 8 hours 02/28/14 0746     02/28/14 1200  vancomycin (VANCOCIN) 1,250 mg in sodium chloride 0.9 % 250 mL IVPB  Status:  Discontinued     1,250 mg 166.7 mL/hr over 90 Minutes Intravenous Every 12  hours 02/28/14 0544 02/28/14 0746   02/28/14 0600  piperacillin-tazobactam (ZOSYN) IVPB 3.375 g     3.375 g 12.5 mL/hr over 240 Minutes Intravenous 3 times per day 02/28/14 0542     02/28/14 0415  piperacillin-tazobactam (ZOSYN) IVPB 3.375 g     3.375 g 100 mL/hr over 30 Minutes Intravenous  Once 02/28/14 0410 02/28/14 0531   02/28/14 0345  vancomycin (VANCOCIN) IVPB 1000 mg/200 mL premix     1,000 mg 200 mL/hr over 60 Minutes Intravenous  Once 02/28/14 0337 02/28/14 0503     Assessment: 85yr morbidly obese male with complications of DM.  Had just completed 6wk course vanc/Zosyn for osteomyelitis of left foot last June.  Now with open wound of pilonidal cyst and possible re-occurrence (or continuation) of osteomyelitis of left foot .  Patient has already recived vancomycin 1gm IV at approx 4am.    Goal of Therapy:  Desire vancomycin steady state serum trough to be approx 15-81mcg/ml.  With CrCl definitely >5ml/min, will institute standard Zosyn regimen with goal to eradicate infection.  Plan:  1.  Zosyn 3.375gm IV q8h (4hr infusions) 2.  Vancomycin 1000mg  IV q8h 3.  Check Vancomycin trough level at steady state 4.  Monitor indices of infection and renal function  [name], Tammera Engert A 02/28/2014,7:46 AM

## 2014-02-28 NOTE — H&P (Signed)
PCP:   Jacqualine Code, DO   Chief Complaint:  Pain in left foot and at tailbone  HPI: 38 yo diabetic male comes in mainly for sore at his tailbone, h/o pilonidal cyst in past and this has recurred with has been opened tonight in the ED.  He also mentioned his left foot was starting to become painful again.  He was just tx with 6 weeks of iv vanco/zosyn for osteomyelitis in this left foot and had debridement done by dr Arnoldo Morale in the last couple of months.  It was thought he would have to have his toe amputated, but dr Arnoldo Morale had hope that his toe could be saved with iv abx.  The foot started hurting again only after he got this pilonidal cyst again.  No fevers.  No chills.  Since the cyst developed again, his sugars have been over 300.  No other rashes.  Wound from recent surgery to left foot has been well healing, and there has been no swelling in this foot either.  Review of Systems:  Positive and negative as per HPI otherwise all other systems are negative  Past Medical History: Past Medical History  Diagnosis Date  . GERD (gastroesophageal reflux disease)   . Diabetes mellitus   . HTN (hypertension)   . Depression   . Anxiety   . Chronic pain   . ED (erectile dysfunction)   . Hyperlipidemia   . Diabetic foot ulcer    Past Surgical History  Procedure Laterality Date  . Knee surgery    . Back surgery    . Cholecystectomy  2009  . Tonsillectomy    . Cholecystectomy open    . Wound debridement Left 10/05/2013    Procedure: INCISION AND DEBRIDEMENT LEFT FOOT;  Surgeon: Jamesetta So, MD;  Location: [facility];  Service: General;  Laterality: Left;    Medications: Prior to Admission medications   Medication Sig Start Date End Date Taking? Authorizing Provider  amLODipine (NORVASC) 10 MG tablet Take 0.5 tablets (5 mg total) by mouth daily. 12/05/13  Yes Nita Sells, MD  escitalopram (LEXAPRO) 10 MG tablet Take 1 tablet (10 mg total) by mouth daily. 10/23/12  Yes Mikey Kirschner, MD  esomeprazole (NEXIUM) 40 MG capsule Take 1 capsule (40 mg total) by mouth daily before breakfast. 03/30/13  Yes Julianne Rice, MD  insulin aspart protamine- aspart (NOVOLOG MIX 70/30) (70-30) 100 UNIT/ML injection Inject 0.55 mLs (55 Units total) into the skin daily with breakfast. 12/05/13  Yes Nita Sells, MD  insulin glargine (LANTUS) 100 UNIT/ML injection Inject 0.13 mLs (13 Units total) into the skin at bedtime. 11/26/13  Yes Eugenie Filler, MD  lisinopril (PRINIVIL,ZESTRIL) 20 MG tablet Take 20 mg by mouth daily.   Yes Historical Provider, MD  oxyCODONE (ROXICODONE) 15 MG immediate release tablet Take one tablet by mouth every 4 hours as needed for breakthrough pain 12/10/13  Yes Pricilla Larsson, NP  collagenase (SANTYL) ointment Apply topically 2 (two) times daily at 10 AM and 5 PM. 12/05/13   Nita Sells, MD  folic acid (FOLVITE) 1 MG tablet Take 1 tablet (1 mg total) by mouth daily. 11/26/13   Eugenie Filler, MD  OxyCODONE (OXYCONTIN) 40 mg T12A 12 hr tablet Take one tablet by mouth every 12 hours for pain. Do not crush 01/03/14   Tiffany L Reed, DO  piperacillin-tazobactam (ZOSYN) 3.375 GM/50ML IVPB Inject 50 mLs (3.375 g total) into the vein every 8 (eight) hours. 12/05/13  Nita Sells, MD  vancomycin 2,000 mg in sodium chloride 0.9 % 500 mL Inject 2,000 mg into the vein every 12 (twelve) hours. 12/05/13   Nita Sells, MD    Allergies:   Allergies  Allergen Reactions  . Levaquin [Levofloxacin] Rash    Social History:  reports that he has never smoked. His smokeless tobacco use includes Snuff. He reports that he drinks alcohol. He reports that he does not use illicit drugs.  Family History: Family History  Problem Relation Age of Onset  . Coronary artery disease Father   . Heart attack Father   . Hypertension Mother     Physical Exam: Filed Vitals:   02/28/14 0131  BP: 135/88  Pulse: 110  Temp: 99.2 F (37.3 C)  TempSrc: Oral   Resp: 20  Height: 6\' 1"  (1.854 m)  Weight: 124.739 kg (275 lb)  SpO2: 98%   General appearance: alert, cooperative and no distress Head: Normocephalic, without obvious abnormality, atraumatic Eyes: negative Nose: Nares normal. Septum midline. Mucosa normal. No drainage or sinus tenderness. Neck: no JVD and supple, symmetrical, trachea midline Lungs: clear to auscultation bilaterally Heart: regular rate and rhythm, S1, S2 normal, no murmur, click, rub or gallop Abdomen: soft, non-tender; bowel sounds normal; no masses,  no organomegaly Extremities: extremities normal, atraumatic, no cyanosis or edema Pulses: 2+ and symmetric Skin: Skin color, texture, turgor normal. No rashes or lesions pilondinal cyst present s/p i&d no absess noted Neurologic: Grossly normal   Labs on Admission:   Recent Labs  02/28/14 0249  NA 132*  K 3.9  CL 94*  CO2 27  GLUCOSE 369*  BUN 8  CREATININE 0.78  CALCIUM 8.9    Recent Labs  02/28/14 0249  AST 9  ALT 13  ALKPHOS 81  BILITOT 0.3  PROT 7.1  ALBUMIN 3.3*    Recent Labs  02/28/14 0249  WBC 7.6  NEUTROABS 6.1  HGB 11.4*  HCT 33.2*  MCV 77.8*  PLT 225    Radiological Exams on Admission: Dg Foot Complete Left  02/28/2014   CLINICAL DATA:  Sore on the top of the left foot.  EXAM: LEFT FOOT - COMPLETE 3+ VIEW  COMPARISON:  12/03/2013  FINDINGS: Since the previous study, there is progression of bone erosion involving the distal left fifth metatarsal bone consistent with progressive osteomyelitis. Associated soft tissue swelling. Left foot appears otherwise intact.  IMPRESSION: Progressive bone erosion in the distal left fifth metatarsal bone consistent with progression of osteomyelitis.   Electronically Signed   By: Lucienne Capers M.D.   On: 02/28/2014 03:30    Assessment/Plan  38 yo male diabetic with pilonidal cyst and possible recurrent osteomyelitis left fifth toe  Principal Problem:   Osteomyelitis-  crp is pending.  Place  on vanc/zosyn.  Unclear if this is recurrent osteo or not.  Consult gen surgery for this and also the cyst, pt well known to dr Arnoldo Morale.  Active Problems:  Stable unless o/w noted   Essential hypertension, benign   Diabetes  ssi   Chronic low back pain   Pilonidal cyst  Admit to med surg.  Full code.  Clarify home meds.  Malayjah Otoole A 02/28/2014, 4:15 AM

## 2014-02-28 NOTE — Discharge Summary (Signed)
Physician Discharge Summary  Christopher Raymond XNA:[id number] DOB: [birth year] DOA: 02/28/2014  PCP: Jacqualine Code, DO  Admit date: 02/28/2014 Discharge date: 02/28/2014  Recommendations for Outpatient Follow-up:  1. Resolution of buttock abscess, possibly related to pilonidal cyst 2. Left fifth toe chronic osteomyelitis, consider further evaluation and treatment as clinically indicated 3. Suspected anemia of chronic disease    Follow-up Information   Follow up with FAVERO,JOHN PATRICK, DO. Schedule an appointment as soon as possible for a visit in 1 week.   Specialty:  Family Medicine   Contact information:   [address] [place] [address] [phone]      Discharge Diagnoses:  1. Buttock abscess possibly related to pilonidal cyst 2. Left fifth toe chronic osteomyelitis 3. Diabetes mellitus 4. Chronic normocytic anemia  Discharge Condition: Improved Disposition: Home  Diet recommendation: diabetic   Filed Weights   02/28/14 0131  Weight: 124.739 kg (275 lb)    History of present illness:  38 year old man with history of diabetes presented with complaint of abscess on buttocks. He underwent incision and drainage in the emergency department. Also complained of some foot pain x-rays suggested osteomyelitis so he was admitted for surgical evaluation.  Hospital Course:  Mr. Radford Pax was seen in consultation with general surgery who has recommended discharge home on oral antibiotics, keep wound clean and dry and outpatient followup with his primary care physician for resolution. In regard to osteomyelitis, general surgery reports the wound has healed well, the patient has a history of osteomyelitis in left foot and has no acute features of acute infection--recommendation for discharge home with outpatient follow-up.  1. Buttock abscess, possibly related to pilonidal cyst. No indication for surgical intervention. 2. Left fifth toe chronic osteomyelitis. No  indication for surgical intervention per surgery. Followup as an outpatient. 3. Diabetes mellitus. Stable. 4. Chronic normocytic anemia. Stable. Plan discharge home today. Patient to keep wound clean and dry. Course of oral antibiotics recommended.  I discussed the radiographic findings with the patient that suggest ongoing infection in his left foot in the bone and recommended outpatient followup. He understood.  PCP out of the office today, will contact 9/8 to discuss  Consultants:  General surgery Procedures: none Antibiotics: doxycycline 9/7 >> 9/13  Discharge Instructions  Discharge Instructions   Activity as tolerated - No restrictions    Complete by:  As directed      Diet Carb Modified    Complete by:  As directed      Discharge instructions    Complete by:  As directed   Call your physician or seek immediate medical attention for fever, increased drainage, pain, swelling, redness or worsening of condition. Keep wound clean and dry, may shower. X-ray of your left foot shows infection in the bone of your 5th toe, it is important to discuss with your physician for recommended further treatment.          Current Discharge Medication List    START taking these medications   Details  doxycycline (VIBRA-TABS) 100 MG tablet Take 1 tablet (100 mg total) by mouth 2 (two) times daily. Qty: 14 tablet, Refills: 0      CONTINUE these medications which have NOT CHANGED   Details  amLODipine (NORVASC) 10 MG tablet Take 0.5 tablets (5 mg total) by mouth daily. Qty: 31 tablet, Refills: 0    escitalopram (LEXAPRO) 10 MG tablet Take 1 tablet (10 mg total) by mouth daily. Qty: 30 tablet, Refills: 5    esomeprazole (  NEXIUM) 40 MG capsule Take 1 capsule (40 mg total) by mouth daily before breakfast. Qty: 30 capsule, Refills: 0    insulin aspart protamine- aspart (NOVOLOG MIX 70/30) (70-30) 100 UNIT/ML injection Inject 0.55 mLs (55 Units total) into the skin daily with breakfast. Qty:  10 mL, Refills: 0    insulin glargine (LANTUS) 100 UNIT/ML injection Inject 0.13 mLs (13 Units total) into the skin at bedtime. Qty: 10 mL, Refills: 0    lisinopril (PRINIVIL,ZESTRIL) 20 MG tablet Take 20 mg by mouth daily.    oxyCODONE (ROXICODONE) 15 MG immediate release tablet Take one tablet by mouth every 4 hours as needed for breakthrough pain Qty: 180 tablet, Refills: 0       Allergies  Allergen Reactions  . Levaquin [Levofloxacin] Rash    The results of significant diagnostics from this hospitalization (including imaging, microbiology, ancillary and laboratory) are listed below for reference.    Significant Diagnostic Studies: Dg Foot Complete Left  02/28/2014   CLINICAL DATA:  Sore on the top of the left foot.  EXAM: LEFT FOOT - COMPLETE 3+ VIEW  COMPARISON:  12/03/2013  FINDINGS: Since the previous study, there is progression of bone erosion involving the distal left fifth metatarsal bone consistent with progressive osteomyelitis. Associated soft tissue swelling. Left foot appears otherwise intact.  IMPRESSION: Progressive bone erosion in the distal left fifth metatarsal bone consistent with progression of osteomyelitis.   Electronically Signed   By: Lucienne Capers M.D.   On: 02/28/2014 03:30   Labs: Basic Metabolic Panel:  Recent Labs Lab 02/28/14 0249  NA 132*  K 3.9  CL 94*  CO2 27  GLUCOSE 369*  BUN 8  CREATININE 0.78  CALCIUM 8.9   Liver Function Tests:  Recent Labs Lab 02/28/14 0249  AST 9  ALT 13  ALKPHOS 81  BILITOT 0.3  PROT 7.1  ALBUMIN 3.3*   CBC:  Recent Labs Lab 02/28/14 0249  WBC 7.6  NEUTROABS 6.1  HGB 11.4*  HCT 33.2*  MCV 77.8*  PLT 225   CBG:  Recent Labs Lab 02/28/14 0739  GLUCAP 240*    Principal Problem:   Osteomyelitis Active Problems:   Essential hypertension, benign   Diabetes   Chronic low back pain   Pilonidal cyst   Time coordinating discharge: 25 minutes  Signed:  [name], MD [facility] 02/28/2014, 10:47 AM

## 2014-02-28 NOTE — Progress Notes (Signed)
ANTIBIOTIC CONSULT NOTE - INITIAL  Pharmacy Consult for vancomycin & Zosyn Indication: open wound (pilonidal cyst) & osteomyelitis of left foot  Allergies  Allergen Reactions  . Levaquin [Levofloxacin] Rash    Patient Measurements: Height: 6\' 1"  (185.4 cm) Weight: 275 lb (124.739 kg) IBW/kg (Calculated) : 79.9 Adjusted Body Weight: 95 kg  Vital Signs: Temp: 99.2 F (37.3 C) (09/07 0131) Temp src: Oral (09/07 0131) BP: 150/94 mmHg (09/07 0441) Pulse Rate: 100 (09/07 0441) Intake/Output from previous day:   Intake/Output from this shift:    Labs:  Recent Labs  02/28/14 0249  WBC 7.6  HGB 11.4*  PLT 225  CREATININE 0.78   Est CrCl = 90-138ml/min  Microbiology: No results found for this or any previous visit (from the past 720 hour(s)).  Medical History: Past Medical History  Diagnosis Date  . GERD (gastroesophageal reflux disease)   . Diabetes mellitus   . HTN (hypertension)   . Depression   . Anxiety   . Chronic pain   . ED (erectile dysfunction)   . Hyperlipidemia   . Diabetic foot ulcer     Medications:  Scheduled:  . sodium chloride   Intravenous STAT  . enoxaparin (LOVENOX) injection  60 mg Subcutaneous Q24H  . insulin aspart  0-9 Units Subcutaneous TID WC  . piperacillin-tazobactam (ZOSYN)  IV  3.375 g Intravenous 3 times per day  . sodium chloride  3 mL Intravenous Q12H  . vancomycin  1,250 mg Intravenous Q12H   Infusions:   PRN: sodium chloride, oxyCODONE-acetaminophen, sodium chloride  Assessment: 4yr large male with complications of DM.  Had just completed 6wk course vanc/Zosyn for osteomyelitis of left foot last June.  Now with open wound of pilonidal cyst and possible re-occurrence (or continuation) of osteomyelitis of left foot .  Patient has already recived vancomycin 1gm IV at approx 4am.    Goal of Therapy:  Desire vancomycin steady state serum trough to be approx 80mcg/ml.  With CrCl definitely >76ml/min, will institute standard  Zosyn regimen.  Have noted that the previous 6 week course of vancomycin may have been 2gm IV q12h, but will need to check further into med history and previous measurements of serum levels.  Plan:  1.  Zosyn 3.375gm IV q8h (4hr infusions) 2.  Vancomycin 1250mg  IV q12h 3.  Check further into previous June vancomycin tx regimen and serum levels later today to perhaps further adjust vancomycin dosing) 4.  Monitor indices of infection and renal function  Penni Homans 02/28/2014,5:49 AM

## 2014-03-01 LAB — C-REACTIVE PROTEIN: CRP: 13.1 mg/dL — ABNORMAL HIGH (ref <0.60)

## 2014-03-15 ENCOUNTER — Telehealth: Payer: Self-pay | Admitting: Family Medicine

## 2014-03-15 NOTE — Telephone Encounter (Signed)
Confirmed with PCP office--d/c summary received, patient has been seen in f/u 9/17.  [name], MD [facility]

## 2014-05-12 ENCOUNTER — Encounter (HOSPITAL_COMMUNITY): Payer: Self-pay | Admitting: Emergency Medicine

## 2014-05-12 ENCOUNTER — Emergency Department (HOSPITAL_COMMUNITY)
Admission: EM | Admit: 2014-05-12 | Discharge: 2014-05-12 | Disposition: A | Discharge status: Home / Self Care | Payer: Medicare Other | Attending: Emergency Medicine | Admitting: Emergency Medicine

## 2014-05-12 DIAGNOSIS — I10 Essential (primary) hypertension: Secondary | ICD-10-CM | POA: Insufficient documentation

## 2014-05-12 DIAGNOSIS — F329 Major depressive disorder, single episode, unspecified: Secondary | ICD-10-CM | POA: Insufficient documentation

## 2014-05-12 DIAGNOSIS — Z792 Long term (current) use of antibiotics: Secondary | ICD-10-CM | POA: Diagnosis not present

## 2014-05-12 DIAGNOSIS — Z79899 Other long term (current) drug therapy: Secondary | ICD-10-CM | POA: Diagnosis not present

## 2014-05-12 DIAGNOSIS — Z794 Long term (current) use of insulin: Secondary | ICD-10-CM | POA: Diagnosis not present

## 2014-05-12 DIAGNOSIS — G8929 Other chronic pain: Secondary | ICD-10-CM | POA: Diagnosis not present

## 2014-05-12 DIAGNOSIS — K219 Gastro-esophageal reflux disease without esophagitis: Secondary | ICD-10-CM | POA: Insufficient documentation

## 2014-05-12 DIAGNOSIS — Z87448 Personal history of other diseases of urinary system: Secondary | ICD-10-CM | POA: Diagnosis not present

## 2014-05-12 DIAGNOSIS — L02411 Cutaneous abscess of right axilla: Secondary | ICD-10-CM | POA: Diagnosis present

## 2014-05-12 DIAGNOSIS — F419 Anxiety disorder, unspecified: Secondary | ICD-10-CM | POA: Diagnosis not present

## 2014-05-12 DIAGNOSIS — E785 Hyperlipidemia, unspecified: Secondary | ICD-10-CM | POA: Diagnosis not present

## 2014-05-12 DIAGNOSIS — E119 Type 2 diabetes mellitus without complications: Secondary | ICD-10-CM | POA: Diagnosis not present

## 2014-05-12 LAB — CBG MONITORING, ED: Glucose-Capillary: 416 mg/dL — ABNORMAL HIGH (ref 70–99)

## 2014-05-12 MED ORDER — OXYCODONE-ACETAMINOPHEN 5-325 MG PO TABS: 2.0000 | ORAL_TABLET | ORAL | Status: DC | PRN

## 2014-05-12 MED ORDER — VANCOMYCIN HCL IN DEXTROSE 1-5 GM/200ML-% IV SOLN
1000.0000 mg | Freq: Once | INTRAVENOUS | Status: AC
  Administered 2014-05-12: 1000 mg via INTRAVENOUS
  Filled 2014-05-12: qty 200

## 2014-05-12 MED ORDER — SODIUM CHLORIDE 0.9 % IV BOLUS (SEPSIS)
1000.0000 mL | Freq: Once | INTRAVENOUS | Status: AC
  Administered 2014-05-12: 1000 mL via INTRAVENOUS

## 2014-05-12 MED ORDER — OXYCODONE-ACETAMINOPHEN 5-325 MG PO TABS
2.0000 | ORAL_TABLET | Freq: Once | ORAL | Status: AC
  Administered 2014-05-12: 2 via ORAL
  Filled 2014-05-12: qty 2

## 2014-05-12 MED ORDER — SULFAMETHOXAZOLE-TRIMETHOPRIM 800-160 MG PO TABS: 1.0000 | ORAL_TABLET | Freq: Two times a day (BID) | ORAL | Status: DC

## 2014-05-12 NOTE — ED Provider Notes (Signed)
CSN: [id number]     Arrival date & time 05/12/14  1413 History   First MD Initiated Contact with Patient 05/12/14 1708     Chief Complaint  Patient presents with  . Abscess     (Consider location/radiation/quality/duration/timing/severity/associated sxs/prior Treatment) HPI..... Draining abscess in right axilla for 3 days. Patient is a diabetic. No fever or chills. No other complaints. Severity is mild to moderate. Palpation makes symptoms worse.  Past Medical History  Diagnosis Date  . GERD (gastroesophageal reflux disease)   . Diabetes mellitus   . HTN (hypertension)   . Depression   . Anxiety   . Chronic pain   . ED (erectile dysfunction)   . Hyperlipidemia   . Diabetic foot ulcer    Past Surgical History  Procedure Laterality Date  . Knee surgery    . Back surgery    . Cholecystectomy  2009  . Tonsillectomy    . Cholecystectomy open    . Wound debridement Left 10/05/2013    Procedure: INCISION AND DEBRIDEMENT LEFT FOOT;  Surgeon: Jamesetta So, MD;  Location: [facility];  Service: General;  Laterality: Left;   Family History  Problem Relation Age of Onset  . Coronary artery disease Father   . Heart attack Father   . Hypertension Mother    History  Substance Use Topics  . Smoking status: Never Smoker   . Smokeless tobacco: Current User    Types: Snuff     Comment: dip x 20  . Alcohol Use: Yes     Comment: very little 4-5 beers/yr    Review of Systems  All other systems reviewed and are negative.     Allergies  Levaquin  Home Medications   Prior to Admission medications   Medication Sig Start Date End Date Taking? Authorizing Provider  amLODipine (NORVASC) 10 MG tablet Take 0.5 tablets (5 mg total) by mouth daily. 12/05/13  Yes Nita Sells, MD  escitalopram (LEXAPRO) 10 MG tablet Take 1 tablet (10 mg total) by mouth daily. 10/23/12  Yes Mikey Kirschner, MD  esomeprazole (NEXIUM) 40 MG capsule Take 1 capsule (40 mg total) by mouth daily before  breakfast. 03/30/13  Yes Julianne Rice, MD  insulin aspart protamine- aspart (NOVOLOG MIX 70/30) (70-30) 100 UNIT/ML injection Inject 0.55 mLs (55 Units total) into the skin daily with breakfast. 12/05/13  Yes Nita Sells, MD  insulin glargine (LANTUS) 100 UNIT/ML injection Inject 0.13 mLs (13 Units total) into the skin at bedtime. Patient taking differently: Inject 0-13 Units into the skin at bedtime. Sliding Scale 11/26/13  Yes Eugenie Filler, MD  lisinopril (PRINIVIL,ZESTRIL) 20 MG tablet Take 20 mg by mouth daily.   Yes Historical Provider, MD  Oxycodone HCl 20 MG TABS Take 20 mg by mouth 6 (six) times daily.   Yes Historical Provider, MD  doxycycline (VIBRA-TABS) 100 MG tablet Take 1 tablet (100 mg total) by mouth 2 (two) times daily. Patient not taking: Reported on 05/12/2014 02/28/14   Samuella Cota, MD  oxyCODONE (ROXICODONE) 15 MG immediate release tablet Take one tablet by mouth every 4 hours as needed for breakthrough pain Patient not taking: Reported on 05/12/2014 12/10/13   Lauree Chandler, NP  oxyCODONE-acetaminophen (PERCOCET) 5-325 MG per tablet Take 2 tablets by mouth every 4 (four) hours as needed. 05/12/14   Nat Christen, MD  sulfamethoxazole-trimethoprim (SEPTRA DS) 800-160 MG per tablet Take 1 tablet by mouth 2 (two) times daily. 05/12/14   Nat Christen, MD   BP  162/104 mmHg  Pulse 108  Temp(Src) 99.1 F (37.3 C) (Oral)  Resp 18  Ht 6\' 1"  (1.854 m)  Wt 263 lb (119.296 kg)  BMI 34.71 kg/m2  SpO2 100% Physical Exam  Constitutional: He is oriented to person, place, and time. He appears well-developed and well-nourished.  HENT:  Head: Normocephalic and atraumatic.  Eyes: Conjunctivae and EOM are normal. Pupils are equal, round, and reactive to light.  Neck: Normal range of motion. Neck supple.  Cardiovascular: Normal rate, regular rhythm and normal heart sounds.   Pulmonary/Chest: Effort normal and breath sounds normal.  Abdominal: Soft. Bowel sounds are normal.   Musculoskeletal: Normal range of motion.  Neurological: He is alert and oriented to person, place, and time.  Skin:  Right axilla: Obvious draining abscess with area of induration approximately 3 cm in diameter. Erythema surrounding the wound.  Psychiatric: He has a normal mood and affect. His behavior is normal.  Nursing note and vitals reviewed.   ED Course  Procedures (including critical care time) Labs Review Labs Reviewed  CBG MONITORING, ED - Abnormal; Notable for the following:    Glucose-Capillary 416 (*)    All other components within normal limits    Imaging Review No results found.   EKG Interpretation None      MDM   Final diagnoses:  Abscess of axilla, right    Patient states that abscess is feeling better after it started draining. Patient is diabetic. Therefore will start IV vancomycin. Discharge antibiotic Septra DS. Patient will return if any worsening condition    Nat Christen, MD 05/12/14 2112

## 2014-05-12 NOTE — ED Notes (Signed)
Pt states that he has abscess that is draining that started 3 days. Saw his PCP today and MD told him to come to ED and he would "direct admit" him. Asked pt why MD was so concerned and pt said it was because he was a diabetic.

## 2014-05-12 NOTE — Discharge Instructions (Signed)
Abscess An abscess (boil or furuncle) is an infected area on or under the skin. This area is filled with yellowish-white fluid (pus) and other material (debris). HOME CARE   Only take medicines as told by your doctor.  If you were given antibiotic medicine, take it as directed. Finish the medicine even if you start to feel better.  If gauze is used, follow your doctor's directions for changing the gauze.  To avoid spreading the infection:  Keep your abscess covered with a bandage.  Wash your hands well.  Do not share personal care items, towels, or whirlpools with others.  Avoid skin contact with others.  Keep your skin and clothes clean around the abscess.  Keep all doctor visits as told. GET HELP RIGHT AWAY IF:   You have more pain, puffiness (swelling), or redness in the wound site.  You have more fluid or blood coming from the wound site.  You have muscle aches, chills, or you feel sick.  You have a fever. MAKE SURE YOU:   Understand these instructions.  Will watch your condition.  Will get help right away if you are not doing well or get worse. Document Released: 11/27/2007 Document Revised: 12/10/2011 Document Reviewed: 08/23/2011 [facility] Patient Information 2015 [place], [place]. This information is not intended to replace advice given to you by your health care provider. Make sure you discuss any questions you have with your health care provider.   Keep your armpit very clean. Antibiotic twice a day. Start in the morning. If the wound drains, this is good. Return if worse in any way.

## 2014-05-12 NOTE — ED Notes (Signed)
Discharge instructions and prescriptions given and reviewed with patient.  Patient verbalized understanding to complete all antibiotic, sedating effects of Percocet and to follow up with PMD as needed.  Patient ambulatory; discharged home in good condition.

## 2014-05-12 NOTE — ED Notes (Signed)
MD at bedside. 

## 2014-05-30 ENCOUNTER — Emergency Department (HOSPITAL_COMMUNITY)
Admission: EM | Admit: 2014-05-30 | Discharge: 2014-05-30 | Disposition: A | Discharge status: Home / Self Care | Payer: Medicare Other | Attending: Emergency Medicine | Admitting: Emergency Medicine

## 2014-05-30 ENCOUNTER — Encounter (HOSPITAL_COMMUNITY): Payer: Self-pay | Admitting: *Deleted

## 2014-05-30 DIAGNOSIS — Z8631 Personal history of diabetic foot ulcer: Secondary | ICD-10-CM | POA: Diagnosis not present

## 2014-05-30 DIAGNOSIS — F419 Anxiety disorder, unspecified: Secondary | ICD-10-CM | POA: Insufficient documentation

## 2014-05-30 DIAGNOSIS — G8929 Other chronic pain: Secondary | ICD-10-CM | POA: Diagnosis not present

## 2014-05-30 DIAGNOSIS — Z87438 Personal history of other diseases of male genital organs: Secondary | ICD-10-CM | POA: Insufficient documentation

## 2014-05-30 DIAGNOSIS — Z792 Long term (current) use of antibiotics: Secondary | ICD-10-CM | POA: Insufficient documentation

## 2014-05-30 DIAGNOSIS — I10 Essential (primary) hypertension: Secondary | ICD-10-CM | POA: Diagnosis not present

## 2014-05-30 DIAGNOSIS — L02411 Cutaneous abscess of right axilla: Secondary | ICD-10-CM | POA: Insufficient documentation

## 2014-05-30 DIAGNOSIS — F329 Major depressive disorder, single episode, unspecified: Secondary | ICD-10-CM | POA: Insufficient documentation

## 2014-05-30 DIAGNOSIS — Z794 Long term (current) use of insulin: Secondary | ICD-10-CM | POA: Diagnosis not present

## 2014-05-30 DIAGNOSIS — K219 Gastro-esophageal reflux disease without esophagitis: Secondary | ICD-10-CM | POA: Diagnosis not present

## 2014-05-30 DIAGNOSIS — Z79899 Other long term (current) drug therapy: Secondary | ICD-10-CM | POA: Diagnosis not present

## 2014-05-30 DIAGNOSIS — E119 Type 2 diabetes mellitus without complications: Secondary | ICD-10-CM | POA: Diagnosis not present

## 2014-05-30 MED ORDER — SULFAMETHOXAZOLE-TRIMETHOPRIM 800-160 MG PO TABS: 1.0000 | ORAL_TABLET | Freq: Two times a day (BID) | ORAL | Status: DC

## 2014-05-30 MED ORDER — LIDOCAINE-EPINEPHRINE (PF) 1 %-1:200000 IJ SOLN
10.0000 mL | Freq: Once | INTRAMUSCULAR | Status: AC
  Administered 2014-05-30: 10 mL
  Filled 2014-05-30: qty 10

## 2014-05-30 MED ORDER — POVIDONE-IODINE 10 % EX SOLN
CUTANEOUS | Status: AC
  Administered 2014-05-30
  Filled 2014-05-30: qty 118

## 2014-05-30 MED ORDER — NAPROXEN 500 MG PO TABS: 500.0000 mg | ORAL_TABLET | Freq: Two times a day (BID) | ORAL | Status: DC

## 2014-05-30 NOTE — ED Provider Notes (Signed)
CSN: [id number]     Arrival date & time 05/30/14  2123 History  This chart was scribed for Christopher Acosta, MD by Christopher Raymond, ED Scribe. This patient was seen in room [facility] and the patient's care was started at 10:51 PM.     Chief Complaint  Patient presents with  . Abscess   The history is provided by the patient. No language interpreter was used.     HPI Comments: Christopher Raymond is a 38 y.o. male who presents to the Emergency Department complaining of abscess to his right axilla. This is a recurrent issue for him; he reports that he was seen and treated here 3 weeks PTA for same symptoms; abscess was draining at that time. He reports slight nausea and vomiting. He denies fevers, chills. He denies any other medical concerns at this time.   The abscess is gradual in onset, recurrent, in the same location in the right axilla, worse with palpation, not associated with fevers  Past Medical History  Diagnosis Date  . GERD (gastroesophageal reflux disease)   . Diabetes mellitus   . HTN (hypertension)   . Depression   . Anxiety   . Chronic pain   . ED (erectile dysfunction)   . Hyperlipidemia   . Diabetic foot ulcer    Past Surgical History  Procedure Laterality Date  . Knee surgery    . Back surgery    . Cholecystectomy  2009  . Tonsillectomy    . Cholecystectomy open    . Wound debridement Left 10/05/2013    Procedure: INCISION AND DEBRIDEMENT LEFT FOOT;  Surgeon: Christopher So, MD;  Location: [facility];  Service: General;  Laterality: Left;   Family History  Problem Relation Age of Onset  . Coronary artery disease Father   . Heart attack Father   . Hypertension Mother    History  Substance Use Topics  . Smoking status: Never Smoker   . Smokeless tobacco: Current User    Types: Snuff     Comment: dip x 20  . Alcohol Use: Yes     Comment: very little 4-5 beers/yr    Review of Systems  Gastrointestinal: Negative for nausea and vomiting.  Skin:       Abscess       Allergies  Levaquin  Home Medications   Prior to Admission medications   Medication Sig Start Date End Date Taking? Authorizing Provider  amLODipine (NORVASC) 10 MG tablet Take 0.5 tablets (5 mg total) by mouth daily. 12/05/13   Nita Sells, MD  doxycycline (VIBRA-TABS) 100 MG tablet Take 1 tablet (100 mg total) by mouth 2 (two) times daily. Patient not taking: Reported on 05/12/2014 02/28/14   Christopher Cota, MD  escitalopram (LEXAPRO) 10 MG tablet Take 1 tablet (10 mg total) by mouth daily. 10/23/12   Mikey Kirschner, MD  esomeprazole (NEXIUM) 40 MG capsule Take 1 capsule (40 mg total) by mouth daily before breakfast. 03/30/13   Christopher Rice, MD  insulin aspart protamine- aspart (NOVOLOG MIX 70/30) (70-30) 100 UNIT/ML injection Inject 0.55 mLs (55 Units total) into the skin daily with breakfast. 12/05/13   Nita Sells, MD  insulin glargine (LANTUS) 100 UNIT/ML injection Inject 0.13 mLs (13 Units total) into the skin at bedtime. Patient taking differently: Inject 0-13 Units into the skin at bedtime. Sliding Scale 11/26/13   Christopher Filler, MD  lisinopril (PRINIVIL,ZESTRIL) 20 MG tablet Take 20 mg by mouth daily.    Historical Provider, MD  naproxen (NAPROSYN) 500 MG tablet Take 1 tablet (500 mg total) by mouth 2 (two) times daily with a meal. 05/30/14   Christopher Acosta, MD  oxyCODONE (ROXICODONE) 15 MG immediate release tablet Take one tablet by mouth every 4 hours as needed for breakthrough pain Patient not taking: Reported on 05/12/2014 12/10/13   Christopher Chandler, NP  Oxycodone HCl 20 MG TABS Take 20 mg by mouth 6 (six) times daily.    Historical Provider, MD  oxyCODONE-acetaminophen (PERCOCET) 5-325 MG per tablet Take 2 tablets by mouth every 4 (four) hours as needed. 05/12/14   Christopher Christen, MD  sulfamethoxazole-trimethoprim (SEPTRA DS) 800-160 MG per tablet Take 1 tablet by mouth every 12 (twelve) hours. 05/30/14   Christopher Acosta, MD   BP 133/83 mmHg  Pulse 93   Temp(Src) 98.7 F (37.1 C) (Oral)  Resp 20  Ht 6\' 1"  (1.854 m)  Wt 260 lb (117.935 kg)  BMI 34.31 kg/m2  SpO2 97% Physical Exam  Constitutional: He appears well-developed and well-nourished.  HENT:  Head: Normocephalic and atraumatic.  Eyes: Conjunctivae are normal. Right eye exhibits no discharge. Left eye exhibits no discharge.  Pulmonary/Chest: Effort normal. No respiratory distress.  Neurological: He is alert. Coordination normal.  Skin: Skin is warm and dry. No rash noted. He is not diaphoretic. No erythema.  Psychiatric: He has a normal mood and affect.  Nursing note and vitals reviewed.  right axilla with 22 cm masses which are erythematous, minimally fluctuant and indurated, no surrounding erythema  ED Course  Procedures   DIAGNOSTIC STUDIES: Oxygen Saturation is 97% on RA, adequate by my interpretation.    COORDINATION OF CARE: 10:56 PM Discussed treatment plan with pt at bedside and pt agreed to plan.   Labs Review Labs Reviewed - No data to display  Imaging Review No results found.    MDM   Final diagnoses:  Abscess of right axilla  INCISION AND DRAINAGE Performed by: Noemi Chapel D Consent: Verbal consent obtained. Risks and benefits: risks, benefits and alternatives were discussed Type: abscess  Body area:right axilla  anesthesia: local infiltration  Incision was made with a scalpel.  Local anesthetic: lidocaine 1% with epinephrine  Anesthetic total: 3 ml  Complexity: complex Blunt dissection to break up loculations  Drainage: purulent  Drainage amount: moderate  Packing material:  Patient tolerance: Patient tolerated the procedure well with no immediate complications.   Meds given in ED:  Medications  povidone-iodine (BETADINE) 10 % external solution (not administered)  lidocaine-EPINEPHrine (XYLOCAINE-EPINEPHrine) 1 %-1:200000 (PF) injection 10 mL (not administered)    New Prescriptions   NAPROXEN (NAPROSYN) 500 MG TABLET     Take 1 tablet (500 mg total) by mouth 2 (two) times daily with a meal.   SULFAMETHOXAZOLE-TRIMETHOPRIM (SEPTRA DS) 800-160 MG PER TABLET    Take 1 tablet by mouth every 12 (twelve) hours.       I personally performed the services described in this documentation, which was scribed in my presence. The recorded information has been reviewed and is accurate.      Christopher Acosta, MD 05/30/14 [phone]

## 2014-05-30 NOTE — Discharge Instructions (Signed)

## 2014-05-30 NOTE — ED Notes (Signed)
Pt c/o abscess to right axilla; pt states he was seen and treated x 3 weeks ago for same complaint and they have come back

## 2014-07-12 ENCOUNTER — Ambulatory Visit: Payer: Medicare Other | Admitting: Internal Medicine

## 2014-08-02 ENCOUNTER — Emergency Department (HOSPITAL_COMMUNITY)
Admission: EM | Admit: 2014-08-02 | Discharge: 2014-08-02 | Disposition: A | Discharge status: Home / Self Care | Payer: Medicare Other | Attending: Emergency Medicine | Admitting: Emergency Medicine

## 2014-08-02 ENCOUNTER — Encounter (HOSPITAL_COMMUNITY): Payer: Self-pay | Admitting: *Deleted

## 2014-08-02 DIAGNOSIS — Z791 Long term (current) use of non-steroidal anti-inflammatories (NSAID): Secondary | ICD-10-CM | POA: Diagnosis not present

## 2014-08-02 DIAGNOSIS — E1165 Type 2 diabetes mellitus with hyperglycemia: Secondary | ICD-10-CM | POA: Diagnosis not present

## 2014-08-02 DIAGNOSIS — R739 Hyperglycemia, unspecified: Secondary | ICD-10-CM

## 2014-08-02 DIAGNOSIS — F419 Anxiety disorder, unspecified: Secondary | ICD-10-CM | POA: Diagnosis not present

## 2014-08-02 DIAGNOSIS — K219 Gastro-esophageal reflux disease without esophagitis: Secondary | ICD-10-CM | POA: Diagnosis not present

## 2014-08-02 DIAGNOSIS — I10 Essential (primary) hypertension: Secondary | ICD-10-CM | POA: Insufficient documentation

## 2014-08-02 DIAGNOSIS — F329 Major depressive disorder, single episode, unspecified: Secondary | ICD-10-CM | POA: Insufficient documentation

## 2014-08-02 DIAGNOSIS — Z794 Long term (current) use of insulin: Secondary | ICD-10-CM | POA: Insufficient documentation

## 2014-08-02 DIAGNOSIS — Z79899 Other long term (current) drug therapy: Secondary | ICD-10-CM | POA: Insufficient documentation

## 2014-08-02 DIAGNOSIS — L02412 Cutaneous abscess of left axilla: Secondary | ICD-10-CM | POA: Diagnosis present

## 2014-08-02 DIAGNOSIS — G8929 Other chronic pain: Secondary | ICD-10-CM | POA: Insufficient documentation

## 2014-08-02 DIAGNOSIS — Z87438 Personal history of other diseases of male genital organs: Secondary | ICD-10-CM | POA: Insufficient documentation

## 2014-08-02 MED ORDER — LIDOCAINE HCL (PF) 1 % IJ SOLN
INTRAMUSCULAR | Status: AC
  Administered 2014-08-02: 02:00:00
  Filled 2014-08-02: qty 5

## 2014-08-02 MED ORDER — SULFAMETHOXAZOLE-TRIMETHOPRIM 800-160 MG PO TABS: 1.0000 | ORAL_TABLET | Freq: Two times a day (BID) | ORAL | Status: AC

## 2014-08-02 MED ORDER — SULFAMETHOXAZOLE-TRIMETHOPRIM 800-160 MG PO TABS
1.0000 | ORAL_TABLET | Freq: Once | ORAL | Status: AC
  Administered 2014-08-02: 1 via ORAL
  Filled 2014-08-02: qty 1

## 2014-08-02 MED ORDER — OXYCODONE-ACETAMINOPHEN 5-325 MG PO TABS: 2.0000 | ORAL_TABLET | ORAL | Status: DC | PRN

## 2014-08-02 NOTE — Discharge Instructions (Signed)
Follow up with your primary care doctor in 2 days for wound check. Return here as needed.   Abscess An abscess (boil or furuncle) is an infected area on or under the skin. This area is filled with yellowish-white fluid (pus) and other material (debris). HOME CARE   Only take medicines as told by your doctor.  If you were given antibiotic medicine, take it as directed. Finish the medicine even if you start to feel better.  If gauze is used, follow your doctor's directions for changing the gauze.  To avoid spreading the infection:  Keep your abscess covered with a bandage.  Wash your hands well.  Do not share personal care items, towels, or whirlpools with others.  Avoid skin contact with others.  Keep your skin and clothes clean around the abscess.  Keep all doctor visits as told. GET HELP RIGHT AWAY IF:   You have more pain, puffiness (swelling), or redness in the wound site.  You have more fluid or blood coming from the wound site.  You have muscle aches, chills, or you feel sick.  You have a fever. MAKE SURE YOU:   Understand these instructions.  Will watch your condition.  Will get help right away if you are not doing well or get worse. Document Released: 11/27/2007 Document Revised: 12/10/2011 Document Reviewed: 08/23/2011 [facility] Patient Information 2015 [place], [place]. This information is not intended to replace advice given to you by your health care provider. Make sure you discuss any questions you have with your health care provider.

## 2014-08-02 NOTE — ED Notes (Signed)
Pt c/o 7 abscesses to left upper arm in axilla region

## 2014-08-02 NOTE — ED Provider Notes (Signed)
CSN: [id number]     Arrival date & time 08/02/14  0014 History   First MD Initiated Contact with Patient 08/02/14 [phone]     Chief Complaint  Patient presents with  . Abscess     (Consider location/radiation/quality/duration/timing/severity/associated sxs/prior Treatment) Patient is a 39 y.o. male presenting with abscess. The history is provided by the patient.  Abscess Location:  Shoulder/arm Shoulder/arm abscess location:  L axilla Abscess quality: fluctuance, painful, redness and warmth   Red streaking: no   Progression:  Worsening Pain details:    Quality:  Burning and throbbing   Severity:  Severe   Timing:  Constant   Progression:  Worsening Relieved by:  Nothing Exacerbated by: pressure. Risk factors: prior abscess    Christopher Raymond is a 39 y.o. male who presents to the ED with pain and swelling to the left axilla. PMH significant for DM, HTN and other chronic problems as listed below. He reports having had abscesses in the past but never this many at one time. He reports 7 abscesses under his left arm.   Past Medical History  Diagnosis Date  . GERD (gastroesophageal reflux disease)   . Diabetes mellitus   . HTN (hypertension)   . Depression   . Anxiety   . Chronic pain   . ED (erectile dysfunction)   . Hyperlipidemia   . Diabetic foot ulcer    Past Surgical History  Procedure Laterality Date  . Knee surgery    . Back surgery    . Cholecystectomy  2009  . Tonsillectomy    . Cholecystectomy open    . Wound debridement Left 10/05/2013    Procedure: INCISION AND DEBRIDEMENT LEFT FOOT;  Surgeon: Jamesetta So, MD;  Location: [facility];  Service: General;  Laterality: Left;   Family History  Problem Relation Age of Onset  . Coronary artery disease Father   . Heart attack Father   . Hypertension Mother    History  Substance Use Topics  . Smoking status: Never Smoker   . Smokeless tobacco: Current User    Types: Snuff     Comment: dip x 20  . Alcohol Use: Yes      Comment: very little 4-5 beers/yr    Review of Systems Negative except as stated in HPI   Allergies  Levaquin  Home Medications   Prior to Admission medications   Medication Sig Start Date End Date Taking? Authorizing Provider  amLODipine (NORVASC) 10 MG tablet Take 0.5 tablets (5 mg total) by mouth daily. 12/05/13   Nita Sells, MD  escitalopram (LEXAPRO) 10 MG tablet Take 1 tablet (10 mg total) by mouth daily. 10/23/12   Mikey Kirschner, MD  esomeprazole (NEXIUM) 40 MG capsule Take 1 capsule (40 mg total) by mouth daily before breakfast. 03/30/13   Julianne Rice, MD  insulin aspart protamine- aspart (NOVOLOG MIX 70/30) (70-30) 100 UNIT/ML injection Inject 0.55 mLs (55 Units total) into the skin daily with breakfast. 12/05/13   Nita Sells, MD  insulin glargine (LANTUS) 100 UNIT/ML injection Inject 0.13 mLs (13 Units total) into the skin at bedtime. Patient taking differently: Inject 0-13 Units into the skin at bedtime. Sliding Scale 11/26/13   Eugenie Filler, MD  lisinopril (PRINIVIL,ZESTRIL) 20 MG tablet Take 20 mg by mouth daily.    Historical Provider, MD  naproxen (NAPROSYN) 500 MG tablet Take 1 tablet (500 mg total) by mouth 2 (two) times daily with a meal. 05/30/14   Johnna Acosta, MD  oxyCODONE-acetaminophen (PERCOCET/ROXICET) 5-325 MG per tablet Take 2 tablets by mouth every 4 (four) hours as needed for moderate pain or severe pain. 08/02/14   Hope Bunnie Pion, NP  sulfamethoxazole-trimethoprim (BACTRIM DS,SEPTRA DS) 800-160 MG per tablet Take 1 tablet by mouth 2 (two) times daily. 08/02/14 08/09/14  Hope Bunnie Pion, NP   BP 157/86 mmHg  Pulse 92  Temp(Src) 98.6 F (37 C) (Oral)  Resp 20  Ht 6\' 1"  (1.854 m)  Wt 255 lb (115.667 kg)  BMI 33.65 kg/m2  SpO2 100% Physical Exam  Constitutional: He is oriented to person, place, and time. He appears well-developed and well-nourished.  HENT:  Head: Normocephalic.  Eyes: EOM are normal.  Neck: Neck supple.   Cardiovascular: Normal rate.   Pulmonary/Chest: Effort normal.  Musculoskeletal: Normal range of motion.  Left axilla with multiple areas that are red, raised and tender   Neurological: He is alert and oriented to person, place, and time. No cranial nerve deficit.  Skin: Skin is warm and dry.  Psychiatric: He has a normal mood and affect. His behavior is normal.  Nursing note and vitals reviewed.  CBG 510  ED Course  Procedures  INCISION AND DRAINAGE Performed by: NEESE,HOPE Consent: Verbal consent obtained. Risks and benefits: risks, benefits and alternatives were discussed Type: abscess  Body area: left axilla  Cleaned with betadine and area draped   Anesthesia: local infiltration  Local anesthetic: lidocaine 1% without epinephrine  Anesthetic total: 3 ml  Incision: straight with # 11 blade   Complexity: complex Blunt dissection to break up loculations  Drainage: purulent  Drainage amount: small  Packing material: none  Patient tolerance: Patient tolerated the procedure well with no immediate complications.   I discussed this case with Dr. Tomi Bamberger MDM  39 y.o. male with pain and swelling to the left axilla and hx of same requiring I&D of abscesses. Stable for d/c without fever and does not appear toxic. Discussed with the patient elevated blood sugar and need for careful monitoring. Will treat for infection and pain. He is to follow up with his PCP in 2 days or return here for worsening symptoms.   Final diagnoses:  Abscess of left axilla  Hyperglycemia      Ashley Murrain, NP 08/02/14 [id number]  [name], MD 08/02/14 [phone]

## 2014-08-03 ENCOUNTER — Emergency Department (HOSPITAL_COMMUNITY)
Admission: EM | Admit: 2014-08-03 | Discharge: 2014-08-03 | Disposition: A | Discharge status: Home / Self Care | Payer: Medicare Other | Attending: Emergency Medicine | Admitting: Emergency Medicine

## 2014-08-03 ENCOUNTER — Encounter (HOSPITAL_COMMUNITY): Payer: Self-pay

## 2014-08-03 DIAGNOSIS — Z794 Long term (current) use of insulin: Secondary | ICD-10-CM | POA: Diagnosis not present

## 2014-08-03 DIAGNOSIS — F329 Major depressive disorder, single episode, unspecified: Secondary | ICD-10-CM | POA: Insufficient documentation

## 2014-08-03 DIAGNOSIS — E11621 Type 2 diabetes mellitus with foot ulcer: Secondary | ICD-10-CM | POA: Diagnosis not present

## 2014-08-03 DIAGNOSIS — Z79899 Other long term (current) drug therapy: Secondary | ICD-10-CM | POA: Insufficient documentation

## 2014-08-03 DIAGNOSIS — G8929 Other chronic pain: Secondary | ICD-10-CM | POA: Diagnosis not present

## 2014-08-03 DIAGNOSIS — K219 Gastro-esophageal reflux disease without esophagitis: Secondary | ICD-10-CM | POA: Insufficient documentation

## 2014-08-03 DIAGNOSIS — M791 Myalgia: Secondary | ICD-10-CM | POA: Insufficient documentation

## 2014-08-03 DIAGNOSIS — L97509 Non-pressure chronic ulcer of other part of unspecified foot with unspecified severity: Secondary | ICD-10-CM | POA: Diagnosis not present

## 2014-08-03 DIAGNOSIS — Z791 Long term (current) use of non-steroidal anti-inflammatories (NSAID): Secondary | ICD-10-CM | POA: Diagnosis not present

## 2014-08-03 DIAGNOSIS — F419 Anxiety disorder, unspecified: Secondary | ICD-10-CM | POA: Diagnosis not present

## 2014-08-03 DIAGNOSIS — Z87448 Personal history of other diseases of urinary system: Secondary | ICD-10-CM | POA: Insufficient documentation

## 2014-08-03 DIAGNOSIS — I10 Essential (primary) hypertension: Secondary | ICD-10-CM | POA: Diagnosis not present

## 2014-08-03 DIAGNOSIS — L02412 Cutaneous abscess of left axilla: Secondary | ICD-10-CM | POA: Insufficient documentation

## 2014-08-03 LAB — CBG MONITORING, ED: Glucose-Capillary: 510 mg/dL — ABNORMAL HIGH (ref 70–99)

## 2014-08-03 MED ORDER — CEPHALEXIN 500 MG PO CAPS: 500.0000 mg | ORAL_CAPSULE | Freq: Four times a day (QID) | ORAL | Status: DC

## 2014-08-03 MED ORDER — HYDROCODONE-ACETAMINOPHEN 5-325 MG PO TABS: 1.0000 | ORAL_TABLET | Freq: Four times a day (QID) | ORAL | Status: DC | PRN

## 2014-08-03 MED ORDER — HYDROCODONE-ACETAMINOPHEN 5-325 MG PO TABS
2.0000 | ORAL_TABLET | Freq: Once | ORAL | Status: AC
  Administered 2014-08-03: 2 via ORAL
  Filled 2014-08-03: qty 2

## 2014-08-03 NOTE — ED Provider Notes (Signed)
CSN: [id number]     Arrival date & time 08/03/14  0130 History  This chart was scribed for Christopher Speak, MD by Delphia Grates, ED Scribe. This patient was seen in room D33C/D33C and the patient's care was started at 3:48 AM.   Chief Complaint  Patient presents with  . Abscess    Patient is a 39 y.o. male presenting with abscess. The history is provided by the patient. No language interpreter was used.  Abscess Location:  Shoulder/arm Shoulder/arm abscess location:  L axilla Abscess quality: painful   Red streaking: no   Duration:  2 days Progression:  Unchanged Pain details:    Severity:  Moderate   Duration:  2 days   Timing:  Constant   Progression:  Unchanged Relieved by:  Nothing Ineffective treatments:  Warm compresses and warm water soaks    HPI Comments: Christopher Raymond is a 39 y.o. male, with history of GERD, HLD, HTN, and DM, who presents to the Emergency Department complaining of left axilla pain that began 2 days ago. Patient states he has approximately 7 abscess in the left axillary region. He states he was seen at [facility] yesterday where he had one I&D'd and was prescribed Bactrim. Patient states there was not much drainage during the I&D and reports the pain still persists. He reports soaking and warm compresses without significant relief. Patient reports history of abscesses, but denies having this many simutaneously.  He denies fever and chills.   Past Medical History  Diagnosis Date  . GERD (gastroesophageal reflux disease)   . Diabetes mellitus   . HTN (hypertension)   . Depression   . Anxiety   . Chronic pain   . ED (erectile dysfunction)   . Hyperlipidemia   . Diabetic foot ulcer    Past Surgical History  Procedure Laterality Date  . Knee surgery    . Back surgery    . Cholecystectomy  2009  . Tonsillectomy    . Cholecystectomy open    . Wound debridement Left 10/05/2013    Procedure: INCISION AND DEBRIDEMENT LEFT FOOT;  Surgeon: Jamesetta So, MD;  Location: [facility];  Service: General;  Laterality: Left;   Family History  Problem Relation Age of Onset  . Coronary artery disease Father   . Heart attack Father   . Hypertension Mother    History  Substance Use Topics  . Smoking status: Never Smoker   . Smokeless tobacco: Current User    Types: Snuff     Comment: dip x 20  . Alcohol Use: Yes     Comment: very little 4-5 beers/yr    Review of Systems  Musculoskeletal: Positive for myalgias.  Skin: Positive for wound (abscess).  All other systems reviewed and are negative.     Allergies  Levaquin  Home Medications   Prior to Admission medications   Medication Sig Start Date End Date Taking? Authorizing Provider  amLODipine (NORVASC) 10 MG tablet Take 0.5 tablets (5 mg total) by mouth daily. 12/05/13   Nita Sells, MD  escitalopram (LEXAPRO) 10 MG tablet Take 1 tablet (10 mg total) by mouth daily. 10/23/12   Mikey Kirschner, MD  esomeprazole (NEXIUM) 40 MG capsule Take 1 capsule (40 mg total) by mouth daily before breakfast. 03/30/13   Julianne Rice, MD  insulin aspart protamine- aspart (NOVOLOG MIX 70/30) (70-30) 100 UNIT/ML injection Inject 0.55 mLs (55 Units total) into the skin daily with breakfast. 12/05/13   Nita Sells, MD  insulin glargine (LANTUS) 100 UNIT/ML injection Inject 0.13 mLs (13 Units total) into the skin at bedtime. Patient taking differently: Inject 0-13 Units into the skin at bedtime. Sliding Scale 11/26/13   Eugenie Filler, MD  lisinopril (PRINIVIL,ZESTRIL) 20 MG tablet Take 20 mg by mouth daily.    Historical Provider, MD  naproxen (NAPROSYN) 500 MG tablet Take 1 tablet (500 mg total) by mouth 2 (two) times daily with a meal. 05/30/14   Johnna Acosta, MD  oxyCODONE-acetaminophen (PERCOCET/ROXICET) 5-325 MG per tablet Take 2 tablets by mouth every 4 (four) hours as needed for moderate pain or severe pain. 08/02/14   Hope Bunnie Pion, NP  sulfamethoxazole-trimethoprim (BACTRIM  DS,SEPTRA DS) 800-160 MG per tablet Take 1 tablet by mouth 2 (two) times daily. 08/02/14 08/09/14  Hope Bunnie Pion, NP   BP 159/85 mmHg  Pulse 81  Temp(Src) 97 F (36.1 C) (Oral)  Resp 18  Ht 6\' 1"  (1.854 m)  Wt 255 lb (115.667 kg)  BMI 33.65 kg/m2  SpO2 96% Physical Exam  Constitutional: He is oriented to person, place, and time. He appears well-developed and well-nourished. No distress.  HENT:  Head: Normocephalic and atraumatic.  Eyes: Conjunctivae and EOM are normal.  Neck: Neck supple. No tracheal deviation present.  Cardiovascular: Normal rate.   Pulmonary/Chest: Effort normal. No respiratory distress.  Musculoskeletal: Normal range of motion.  Neurological: He is alert and oriented to person, place, and time.  Skin: Skin is warm and dry.  In the left axilla, there are multiple pea sized abscesses present. They are tender to the touch. No significant erythema.  Psychiatric: He has a normal mood and affect. His behavior is normal.  Nursing note and vitals reviewed.   ED Course  Procedures (including critical care time)     COORDINATION OF CARE: At 6553 Discussed treatment plan with patient which includes ABX. Patient agrees.   Labs Review Labs Reviewed - No data to display  Imaging Review No results found.   EKG Interpretation None      MDM   Final diagnoses:  None    Patient presents with complaints of pain and swelling in his left axilla. Yesterday evening, he underwent I&D of was thought to be an abscess. From what the patient tells me and I can decipher from the note, only a small amount of purulent material was expressed. These areas are multiple and small and I do not feel any fluctuance. I do not believe a further attempt at I&D is necessary. We'll treat with any medication, add Keflex to the antibiotic regimen, encouraged warm soaks, and when necessary return.  I personally performed the services described in this documentation, which was scribed in my  presence. The recorded information has been reviewed and is accurate.      Christopher Speak, MD 08/04/14 [phone]

## 2014-08-03 NOTE — ED Notes (Signed)
See paper triage

## 2014-08-03 NOTE — ED Notes (Signed)
Unable to have pt sign due to registration not getting out of chart, pt understands all paper work and will follow up as directed and as needed.

## 2014-08-03 NOTE — ED Notes (Signed)
MD at bedside. 

## 2014-08-03 NOTE — Discharge Instructions (Signed)
Continue Bactrim as before. Add Keflex as prescribed.  Hydrocodone as prescribed as needed for pain.  Apply warm soaks as often as possible for the next 2 days.   Abscess An abscess is an infected area that contains a collection of pus and debris.It can occur in almost any part of the body. An abscess is also known as a furuncle or boil. CAUSES  An abscess occurs when tissue gets infected. This can occur from blockage of oil or sweat glands, infection of hair follicles, or a minor injury to the skin. As the body tries to fight the infection, pus collects in the area and creates pressure under the skin. This pressure causes pain. People with weakened immune systems have difficulty fighting infections and get certain abscesses more often.  SYMPTOMS Usually an abscess develops on the skin and becomes a painful mass that is red, warm, and tender. If the abscess forms under the skin, you may feel a moveable soft area under the skin. Some abscesses break open (rupture) on their own, but most will continue to get worse without care. The infection can spread deeper into the body and eventually into the bloodstream, causing you to feel ill.  DIAGNOSIS  Your caregiver will take your medical history and perform a physical exam. A sample of fluid may also be taken from the abscess to determine what is causing your infection. TREATMENT  Your caregiver may prescribe antibiotic medicines to fight the infection. However, taking antibiotics alone usually does not cure an abscess. Your caregiver may need to make a small cut (incision) in the abscess to drain the pus. In some cases, gauze is packed into the abscess to reduce pain and to continue draining the area. HOME CARE INSTRUCTIONS   Only take over-the-counter or prescription medicines for pain, discomfort, or fever as directed by your caregiver.  If you were prescribed antibiotics, take them as directed. Finish them even if you start to feel better.  If  gauze is used, follow your caregiver's directions for changing the gauze.  To avoid spreading the infection:  Keep your draining abscess covered with a bandage.  Wash your hands well.  Do not share personal care items, towels, or whirlpools with others.  Avoid skin contact with others.  Keep your skin and clothes clean around the abscess.  Keep all follow-up appointments as directed by your caregiver. SEEK MEDICAL CARE IF:   You have increased pain, swelling, redness, fluid drainage, or bleeding.  You have muscle aches, chills, or a general ill feeling.  You have a fever. MAKE SURE YOU:   Understand these instructions.  Will watch your condition.  Will get help right away if you are not doing well or get worse. Document Released: 03/20/2005 Document Revised: 12/10/2011 Document Reviewed: 08/23/2011 [facility] Patient Information 2015 [place], [place]. This information is not intended to replace advice given to you by your health care provider. Make sure you discuss any questions you have with your health care provider.

## 2014-08-08 MED FILL — Oxycodone w/ Acetaminophen Tab 5-325 MG: ORAL | Qty: 6 | Status: AC

## 2014-08-18 ENCOUNTER — Encounter (HOSPITAL_COMMUNITY): Payer: Self-pay

## 2014-08-18 ENCOUNTER — Emergency Department (HOSPITAL_COMMUNITY)
Admission: EM | Admit: 2014-08-18 | Discharge: 2014-08-18 | Disposition: A | Discharge status: Home / Self Care | Payer: Medicare Other | Attending: Emergency Medicine | Admitting: Emergency Medicine

## 2014-08-18 DIAGNOSIS — E785 Hyperlipidemia, unspecified: Secondary | ICD-10-CM | POA: Diagnosis not present

## 2014-08-18 DIAGNOSIS — K219 Gastro-esophageal reflux disease without esophagitis: Secondary | ICD-10-CM | POA: Diagnosis not present

## 2014-08-18 DIAGNOSIS — F329 Major depressive disorder, single episode, unspecified: Secondary | ICD-10-CM | POA: Insufficient documentation

## 2014-08-18 DIAGNOSIS — E119 Type 2 diabetes mellitus without complications: Secondary | ICD-10-CM | POA: Insufficient documentation

## 2014-08-18 DIAGNOSIS — E871 Hypo-osmolality and hyponatremia: Secondary | ICD-10-CM | POA: Insufficient documentation

## 2014-08-18 DIAGNOSIS — G8929 Other chronic pain: Secondary | ICD-10-CM | POA: Insufficient documentation

## 2014-08-18 DIAGNOSIS — Z792 Long term (current) use of antibiotics: Secondary | ICD-10-CM | POA: Diagnosis not present

## 2014-08-18 DIAGNOSIS — Z794 Long term (current) use of insulin: Secondary | ICD-10-CM | POA: Insufficient documentation

## 2014-08-18 DIAGNOSIS — L732 Hidradenitis suppurativa: Secondary | ICD-10-CM | POA: Insufficient documentation

## 2014-08-18 DIAGNOSIS — I10 Essential (primary) hypertension: Secondary | ICD-10-CM | POA: Insufficient documentation

## 2014-08-18 DIAGNOSIS — R29810 Facial weakness: Secondary | ICD-10-CM | POA: Diagnosis present

## 2014-08-18 DIAGNOSIS — E1165 Type 2 diabetes mellitus with hyperglycemia: Secondary | ICD-10-CM | POA: Insufficient documentation

## 2014-08-18 DIAGNOSIS — Z872 Personal history of diseases of the skin and subcutaneous tissue: Secondary | ICD-10-CM | POA: Insufficient documentation

## 2014-08-18 DIAGNOSIS — Z79899 Other long term (current) drug therapy: Secondary | ICD-10-CM | POA: Diagnosis not present

## 2014-08-18 DIAGNOSIS — R739 Hyperglycemia, unspecified: Secondary | ICD-10-CM

## 2014-08-18 LAB — CBC WITH DIFFERENTIAL/PLATELET
BASOS ABS: 0 10*3/uL (ref 0.0–0.1)
BASOS PCT: 1 % (ref 0–1)
Eosinophils Absolute: 0.2 10*3/uL (ref 0.0–0.7)
Eosinophils Relative: 3 % (ref 0–5)
HCT: 34.2 % — ABNORMAL LOW (ref 39.0–52.0)
Hemoglobin: 11.4 g/dL — ABNORMAL LOW (ref 13.0–17.0)
LYMPHS ABS: 1.8 10*3/uL (ref 0.7–4.0)
LYMPHS PCT: 32 % (ref 12–46)
MCH: 26.8 pg (ref 26.0–34.0)
MCHC: 33.3 g/dL (ref 30.0–36.0)
MCV: 80.3 fL (ref 78.0–100.0)
Monocytes Absolute: 0.3 10*3/uL (ref 0.1–1.0)
Monocytes Relative: 6 % (ref 3–12)
Neutro Abs: 3.3 10*3/uL (ref 1.7–7.7)
Neutrophils Relative %: 58 % (ref 43–77)
Platelets: 262 10*3/uL (ref 150–400)
RBC: 4.26 MIL/uL (ref 4.22–5.81)
RDW: 13 % (ref 11.5–15.5)
WBC: 5.6 10*3/uL (ref 4.0–10.5)

## 2014-08-18 LAB — BASIC METABOLIC PANEL
Anion gap: 5 (ref 5–15)
BUN: 10 mg/dL (ref 6–23)
CO2: 26 mmol/L (ref 19–32)
Calcium: 8.4 mg/dL (ref 8.4–10.5)
Chloride: 95 mmol/L — ABNORMAL LOW (ref 96–112)
Creatinine, Ser: 0.93 mg/dL (ref 0.50–1.35)
Glucose, Bld: 563 mg/dL (ref 70–99)
Potassium: 4.1 mmol/L (ref 3.5–5.1)
SODIUM: 126 mmol/L — AB (ref 135–145)

## 2014-08-18 LAB — CBG MONITORING, ED: Glucose-Capillary: 409 mg/dL — ABNORMAL HIGH (ref 70–99)

## 2014-08-18 MED ORDER — DOXYCYCLINE HYCLATE 100 MG PO CAPS: 100.0000 mg | ORAL_CAPSULE | Freq: Two times a day (BID) | ORAL | Status: DC

## 2014-08-18 MED ORDER — OXYCODONE-ACETAMINOPHEN 5-325 MG PO TABS
1.0000 | ORAL_TABLET | Freq: Once | ORAL | Status: AC
  Administered 2014-08-18: 1 via ORAL
  Filled 2014-08-18: qty 1

## 2014-08-18 MED ORDER — INSULIN ASPART 100 UNIT/ML ~~LOC~~ SOLN
5.0000 [IU] | Freq: Once | SUBCUTANEOUS | Status: AC
  Administered 2014-08-18: 5 [IU] via SUBCUTANEOUS
  Filled 2014-08-18: qty 1

## 2014-08-18 MED ORDER — OXYCODONE-ACETAMINOPHEN 5-325 MG PO TABS: 1.0000 | ORAL_TABLET | ORAL | Status: DC | PRN

## 2014-08-18 MED ORDER — INSULIN ASPART 100 UNIT/ML ~~LOC~~ SOLN
10.0000 [IU] | Freq: Once | SUBCUTANEOUS | Status: AC
  Administered 2014-08-18: 10 [IU] via SUBCUTANEOUS
  Filled 2014-08-18: qty 1

## 2014-08-18 MED ORDER — LIDOCAINE HCL (PF) 1 % IJ SOLN: 10.0000 mL | Freq: Once | INTRAMUSCULAR | Status: DC

## 2014-08-18 MED ORDER — DOXYCYCLINE HYCLATE 100 MG PO TABS
100.0000 mg | ORAL_TABLET | Freq: Once | ORAL | Status: AC
  Administered 2014-08-18: 100 mg via ORAL
  Filled 2014-08-18: qty 1

## 2014-08-18 NOTE — ED Notes (Signed)
Pt is primarily here for multiple boils under left arm.  Pt states he also noted that the left side of his face felt tingly tonight after taking a shower approx 2315.  Pt states it feels like the left side of his face is "drooping"  Pt denies other c/o weakness

## 2014-08-18 NOTE — ED Notes (Signed)
CRITICAL VALUE ALERT  Critical value received:  Glucose 563  Date of notification:  08/18/14  Time of notification:  8832  Critical value read back:Yes.    Nurse who received alert:  Matilde Haymaker, RN  MD notified (1st page):  Dr. Roxanne Mins  Time of first page: [phone]

## 2014-08-18 NOTE — ED Provider Notes (Signed)
CSN: [id number]     Arrival date & time 08/18/14  0125 History   First MD Initiated Contact with Patient 08/18/14 0242     Chief Complaint  Patient presents with  . Facial Droop  . Abscess     (Consider location/radiation/quality/duration/timing/severity/associated sxs/prior Treatment) Patient is a 39 y.o. male presenting with abscess. The history is provided by the patient.  Abscess He had been in the emergency department 2 weeks ago for incision and drainage of abscesses in his left axilla. There is only slight drainage and he never really improved. He was initially placed on trimethoprim-sulfamethoxazole and had cephalexin added to it. He states that there has not been any improvement since then. There has been a small amount of foul-smelling drainage. He continues to have pain in the axilla which he rates at 8/10. In addition, tonight he took a shower and when he got out of the shower, he noted some puffiness in the left side of his face and noted a pimple beside the side of his nose. Denies fever, chills, sweats. He denies nausea or vomiting. He is diabetic and blood sugars have been consistently elevated in the 300 range.  Past Medical History  Diagnosis Date  . GERD (gastroesophageal reflux disease)   . Diabetes mellitus   . HTN (hypertension)   . Depression   . Anxiety   . Chronic pain   . ED (erectile dysfunction)   . Hyperlipidemia   . Diabetic foot ulcer    Past Surgical History  Procedure Laterality Date  . Knee surgery    . Back surgery    . Cholecystectomy  2009  . Tonsillectomy    . Cholecystectomy open    . Wound debridement Left 10/05/2013    Procedure: INCISION AND DEBRIDEMENT LEFT FOOT;  Surgeon: Jamesetta So, MD;  Location: [facility];  Service: General;  Laterality: Left;   Family History  Problem Relation Age of Onset  . Coronary artery disease Father   . Heart attack Father   . Hypertension Mother    History  Substance Use Topics  . Smoking status:  Never Smoker   . Smokeless tobacco: Current User    Types: Snuff     Comment: dip x 20  . Alcohol Use: Yes     Comment: very little 4-5 beers/yr    Review of Systems  All other systems reviewed and are negative.     Allergies  Levaquin  Home Medications   Prior to Admission medications   Medication Sig Start Date End Date Taking? Authorizing Provider  amLODipine (NORVASC) 10 MG tablet Take 0.5 tablets (5 mg total) by mouth daily. 12/05/13  Yes Nita Sells, MD  escitalopram (LEXAPRO) 10 MG tablet Take 1 tablet (10 mg total) by mouth daily. 10/23/12  Yes Mikey Kirschner, MD  esomeprazole (NEXIUM) 40 MG capsule Take 1 capsule (40 mg total) by mouth daily before breakfast. 03/30/13  Yes Julianne Rice, MD  insulin aspart protamine- aspart (NOVOLOG MIX 70/30) (70-30) 100 UNIT/ML injection Inject 0.55 mLs (55 Units total) into the skin daily with breakfast. 12/05/13  Yes Nita Sells, MD  insulin glargine (LANTUS) 100 UNIT/ML injection Inject 0.13 mLs (13 Units total) into the skin at bedtime. Patient taking differently: Inject 0-13 Units into the skin at bedtime. Sliding Scale 11/26/13  Yes Eugenie Filler, MD  lisinopril (PRINIVIL,ZESTRIL) 20 MG tablet Take 20 mg by mouth daily.   Yes Historical Provider, MD  cephALEXin (KEFLEX) 500 MG capsule Take 1 capsule (  500 mg total) by mouth 4 (four) times daily. 08/03/14   Veryl Speak, MD  HYDROcodone-acetaminophen (NORCO/VICODIN) 5-325 MG per tablet Take 1-2 tablets by mouth every 6 (six) hours as needed. 08/03/14   Veryl Speak, MD  naproxen (NAPROSYN) 500 MG tablet Take 1 tablet (500 mg total) by mouth 2 (two) times daily with a meal. 05/30/14   Johnna Acosta, MD  oxyCODONE-acetaminophen (PERCOCET/ROXICET) 5-325 MG per tablet Take 2 tablets by mouth every 4 (four) hours as needed for moderate pain or severe pain. 08/02/14   Hope Bunnie Pion, NP   BP 173/91 mmHg  Pulse 86  Temp(Src) 98.7 F (37.1 C) (Oral)  Resp 20  Ht 6\' 1"  (1.854 m)   Wt 255 lb (115.667 kg)  BMI 33.65 kg/m2  SpO2 100% Physical Exam  Nursing note and vitals reviewed.  39 year old male, resting comfortably and in no acute distress. Vital signs are significant for hypertension. Oxygen saturation is 100%, which is normal. Head is normocephalic and atraumatic. PERRLA, EOMI. Oropharynx is clear. There is a pustule adjacent to the left nasal ala, with mild swelling surrounding it. Neck is nontender and supple without adenopathy or JVD. Back is nontender and there is no CVA tenderness. Lungs are clear without rales, wheezes, or rhonchi. Chest is nontender. Heart has regular rate and rhythm without murmur. Abdomen is soft, flat, nontender without masses or hepatosplenomegaly and peristalsis is normoactive. Extremities have no cyanosis or edema, full range of motion is present. Multiple swollen areas in the left axilla, some of which appear to be contiguous. There is no definite drainage but there is some slight fluctuance. Skin is warm and dry without rash. Neurologic: Mental status is normal, cranial nerves are intact, there are no motor or sensory deficits.  ED Course  Procedures (including critical care time) Labs Review Results for orders placed or performed during the hospital encounter of 08/18/14  CBC with Differential  Result Value Ref Range   WBC 5.6 4.0 - 10.5 K/uL   RBC 4.26 4.22 - 5.81 MIL/uL   Hemoglobin 11.4 (L) 13.0 - 17.0 g/dL   HCT 34.2 (L) 39.0 - 52.0 %   MCV 80.3 78.0 - 100.0 fL   MCH 26.8 26.0 - 34.0 pg   MCHC 33.3 30.0 - 36.0 g/dL   RDW 13.0 11.5 - 15.5 %   Platelets 262 150 - 400 K/uL   Neutrophils Relative % 58 43 - 77 %   Neutro Abs 3.3 1.7 - 7.7 K/uL   Lymphocytes Relative 32 12 - 46 %   Lymphs Abs 1.8 0.7 - 4.0 K/uL   Monocytes Relative 6 3 - 12 %   Monocytes Absolute 0.3 0.1 - 1.0 K/uL   Eosinophils Relative 3 0 - 5 %   Eosinophils Absolute 0.2 0.0 - 0.7 K/uL   Basophils Relative 1 0 - 1 %   Basophils Absolute 0.0 0.0  - 0.1 K/uL  Basic metabolic panel  Result Value Ref Range   Sodium 126 (L) 135 - 145 mmol/L   Potassium 4.1 3.5 - 5.1 mmol/L   Chloride 95 (L) 96 - 112 mmol/L   CO2 26 19 - 32 mmol/L   Glucose, Bld 563 (HH) 70 - 99 mg/dL   BUN 10 6 - 23 mg/dL   Creatinine, Ser 0.93 0.50 - 1.35 mg/dL   Calcium 8.4 8.4 - 10.5 mg/dL   GFR calc non Af Amer >90 >90 mL/min   GFR calc Af Amer >90 >90 mL/min  Anion gap 5 5 - 15  CBG monitoring, ED  Result Value Ref Range   Glucose-Capillary 409 (H) 70 - 99 mg/dL   Comment 1 Call MD NNP PA CNM      MDM   Final diagnoses:  Hidradenitis suppurativa of left axilla  Hyperglycemia  Hyponatremia    Multiple left axillary abscesses which are probably related to underlying hidradenitis suppurativa. Ultrasound is not available to confirm presence of drainable abscess, so incision and drainage will be attempted. Old records are reviewed showing to recent ED visits for abscesses in the left axilla and prior ED visits for abscess in the right axilla. In addition, facial pustule will need to be followed closely.  After discussion, patient felt that he did not wish to have additional procedure with a relatively small likelihood of success. I have talked with him about possible need for more extensive surgery to clear his axillae. He is referred to Dr. Arnoldo Morale for surgical opinion. Blood sugar was treated with insulin and has come down. Is given a second dose of insulin. He does have a mild hyponatremia which is partly related to hypernatremia but not clinically significant. Since he did not improve with trimethoprim-sulfamethoxazole and cephalexin, I will try him on doxycycline. He is given oxycodone-acetaminophen for pain. Advised return for worsening swelling in the axillae or facial area, or if he develops a fever.Delora Fuel, MD 25/49/82 6415

## 2014-08-18 NOTE — Discharge Instructions (Signed)
Return if the swelling in your arm pit or on your face starts getting worse. Return if you start running a fever.   Hidradenitis Suppurativa, Sweat Gland Abscess Hidradenitis suppurativa is a long lasting (chronic), uncommon disease of the sweat glands. With this, boil-like lumps and scarring develop in the groin, some times under the arms (axillae), and under the breasts. It may also uncommonly occur behind the ears, in the crease of the buttocks, and around the genitals.  CAUSES  The cause is from a blocking of the sweat glands. They then become infected. It may cause drainage and odor. It is not contagious. So it cannot be given to someone else. It most often shows up in puberty (about 47 to 39 years of age). But it may happen much later. It is similar to acne which is a disease of the sweat glands. This condition is slightly more common in African-Americans and women. SYMPTOMS   Hidradenitis usually starts as one or more red, tender, swellings in the groin or under the arms (axilla).  Over a period of hours to days the lesions get larger. They often open to the skin surface, draining clear to yellow-colored fluid.  The infected area heals with scarring. DIAGNOSIS  Your caregiver makes this diagnosis by looking at you. Sometimes cultures (growing germs on plates in the lab) may be taken. This is to see what germ (bacterium) is causing the infection.  TREATMENT   Topical germ killing medicine applied to the skin (antibiotics) are the treatment of choice. Antibiotics taken by mouth (systemic) are sometimes needed when the condition is getting worse or is severe.  Avoid tight-fitting clothing which traps moisture in.  Dirt does not cause hidradenitis and it is not caused by poor hygiene.  Involved areas should be cleaned daily using an antibacterial soap. Some patients find that the liquid form of Lever 2000, applied to the involved areas as a lotion after bathing, can help reduce the odor  related to this condition.  Sometimes surgery is needed to drain infected areas or remove scarred tissue. Removal of large amounts of tissue is used only in severe cases.  Birth control pills may be helpful.  Oral retinoids (vitamin A derivatives) for 6 to 12 months which are effective for acne may also help this condition.  Weight loss will improve but not cure hidradenitis. It is made worse by being overweight. But the condition is not caused by being overweight.  This condition is more common in people who have had acne.  It may become worse under stress. There is no medical cure for hidradenitis. It can be controlled, but not cured. The condition usually continues for years with periods of getting worse and getting better (remission). Document Released: 01/23/2004 Document Revised: 09/02/2011 Document Reviewed: 09/10/2013 [facility] Patient Information 2015 [place], [place]. This information is not intended to replace advice given to you by your health care provider. Make sure you discuss any questions you have with your health care provider.  Doxycycline tablets or capsules What is this medicine? DOXYCYCLINE (dox i SYE kleen) is a tetracycline antibiotic. It kills certain bacteria or stops their growth. It is used to treat many kinds of infections, like dental, skin, respiratory, and urinary tract infections. It also treats acne, Lyme disease, malaria, and certain sexually transmitted infections. This medicine may be used for other purposes; ask your health care provider or pharmacist if you have questions. COMMON BRAND NAME(S): Acticlate, Adoxa, Adoxa CK, Adoxa Pak, Adoxa TT, Alodox, Avidoxy, Doxal,  Monodox, Morgidox 1x, Morgidox 1x Kit, Morgidox 2x, Morgidox 2x Kit, Ocudox, Vibra-Tabs, Vibramycin What should I tell my health care provider before I take this medicine? They need to know if you have any of these conditions: -liver disease -long exposure to sunlight like working  outdoors -stomach problems like colitis -an unusual or allergic reaction to doxycycline, tetracycline antibiotics, other medicines, foods, dyes, or preservatives -pregnant or trying to get pregnant -breast-feeding How should I use this medicine? Take this medicine by mouth with a full glass of water. Follow the directions on the prescription label. It is best to take this medicine without food, but if it upsets your stomach take it with food. Take your medicine at regular intervals. Do not take your medicine more often than directed. Take all of your medicine as directed even if you think you are better. Do not skip doses or stop your medicine early. Talk to your pediatrician regarding the use of this medicine in children. Special care may be needed. While this drug may be prescribed for children as young as 71 years old for selected conditions, precautions do apply. Overdosage: If you think you have taken too much of this medicine contact a poison control center or emergency room at once. NOTE: This medicine is only for you. Do not share this medicine with others. What if I miss a dose? If you miss a dose, take it as soon as you can. If it is almost time for your next dose, take only that dose. Do not take double or extra doses. What may interact with this medicine? -antacids -barbiturates -birth control pills -bismuth subsalicylate -carbamazepine -methoxyflurane -other antibiotics -phenytoin -vitamins that contain iron -warfarin This list may not describe all possible interactions. Give your health care provider a list of all the medicines, herbs, non-prescription drugs, or dietary supplements you use. Also tell them if you smoke, drink alcohol, or use illegal drugs. Some items may interact with your medicine. What should I watch for while using this medicine? Tell your doctor or health care professional if your symptoms do not improve. Do not treat diarrhea with over the counter products.  Contact your doctor if you have diarrhea that lasts more than 2 days or if it is severe and watery. Do not take this medicine just before going to bed. It may not dissolve properly when you lay down and can cause pain in your throat. Drink plenty of fluids while taking this medicine to also help reduce irritation in your throat. This medicine can make you more sensitive to the sun. Keep out of the sun. If you cannot avoid being in the sun, wear protective clothing and use sunscreen. Do not use sun lamps or tanning beds/booths. Birth control pills may not work properly while you are taking this medicine. Talk to your doctor about using an extra method of birth control. If you are being treated for a sexually transmitted infection, avoid sexual contact until you have finished your treatment. Your sexual partner may also need treatment. Avoid antacids, aluminum, calcium, magnesium, and iron products for 4 hours before and 2 hours after taking a dose of this medicine. If you are using this medicine to prevent malaria, you should still protect yourself from contact with mosquitos. Stay in screened-in areas, use mosquito nets, keep your body covered, and use an insect repellent. What side effects may I notice from receiving this medicine? Side effects that you should report to your doctor or health care professional as soon as possible: -  allergic reactions like skin rash, itching or hives, swelling of the face, lips, or tongue -difficulty breathing -fever -itching in the rectal or genital area -pain on swallowing -redness, blistering, peeling or loosening of the skin, including inside the mouth -severe stomach pain or cramps -unusual bleeding or bruising -unusually weak or tired -yellowing of the eyes or skin Side effects that usually do not require medical attention (report to your doctor or health care professional if they continue or are bothersome): -diarrhea -loss of appetite -nausea,  vomiting This list may not describe all possible side effects. Call your doctor for medical advice about side effects. You may report side effects to FDA at 1-800-FDA-1088. Where should I keep my medicine? Keep out of the reach of children. Store at room temperature, below 30 degrees C (86 degrees F). Protect from light. Keep container tightly closed. Throw away any unused medicine after the expiration date. Taking this medicine after the expiration date can make you seriously ill. NOTE: This sheet is a summary. It may not cover all possible information. If you have questions about this medicine, talk to your doctor, pharmacist, or health care provider.  2015, Elsevier/Gold Standard. (2013-04-16 13:58:06)  Acetaminophen; Oxycodone tablets What is this medicine? ACETAMINOPHEN; OXYCODONE (a set a MEE noe fen; ox i KOE done) is a pain reliever. It is used to treat mild to moderate pain. This medicine may be used for other purposes; ask your health care provider or pharmacist if you have questions. COMMON BRAND NAME(S): Endocet, Magnacet, Narvox, Percocet, Perloxx, Primalev, Primlev, Roxicet, Xolox What should I tell my health care provider before I take this medicine? They need to know if you have any of these conditions: -brain tumor -Crohn's disease, inflammatory bowel disease, or ulcerative colitis -drug abuse or addiction -head injury -heart or circulation problems -if you often drink alcohol -kidney disease or problems going to the bathroom -liver disease -lung disease, asthma, or breathing problems -an unusual or allergic reaction to acetaminophen, oxycodone, other opioid analgesics, other medicines, foods, dyes, or preservatives -pregnant or trying to get pregnant -breast-feeding How should I use this medicine? Take this medicine by mouth with a full glass of water. Follow the directions on the prescription label. Take your medicine at regular intervals. Do not take your medicine  more often than directed. Talk to your pediatrician regarding the use of this medicine in children. Special care may be needed. Patients over 52 years old may have a stronger reaction and need a smaller dose. Overdosage: If you think you have taken too much of this medicine contact a poison control center or emergency room at once. NOTE: This medicine is only for you. Do not share this medicine with others. What if I miss a dose? If you miss a dose, take it as soon as you can. If it is almost time for your next dose, take only that dose. Do not take double or extra doses. What may interact with this medicine? -alcohol -antihistamines -barbiturates like amobarbital, butalbital, butabarbital, methohexital, pentobarbital, phenobarbital, thiopental, and secobarbital -benztropine -drugs for bladder problems like solifenacin, trospium, oxybutynin, tolterodine, hyoscyamine, and methscopolamine -drugs for breathing problems like ipratropium and tiotropium -drugs for certain stomach or intestine problems like propantheline, homatropine methylbromide, glycopyrrolate, atropine, belladonna, and dicyclomine -general anesthetics like etomidate, ketamine, nitrous oxide, propofol, desflurane, enflurane, halothane, isoflurane, and sevoflurane -medicines for depression, anxiety, or psychotic disturbances -medicines for sleep -muscle relaxants -naltrexone -narcotic medicines (opiates) for pain -phenothiazines like perphenazine, thioridazine, chlorpromazine, mesoridazine, fluphenazine, prochlorperazine, promazine, and  trifluoperazine -scopolamine -tramadol -trihexyphenidyl This list may not describe all possible interactions. Give your health care provider a list of all the medicines, herbs, non-prescription drugs, or dietary supplements you use. Also tell them if you smoke, drink alcohol, or use illegal drugs. Some items may interact with your medicine. What should I watch for while using this medicine? Tell  your doctor or health care professional if your pain does not go away, if it gets worse, or if you have new or a different type of pain. You may develop tolerance to the medicine. Tolerance means that you will need a higher dose of the medication for pain relief. Tolerance is normal and is expected if you take this medicine for a long time. Do not suddenly stop taking your medicine because you may develop a severe reaction. Your body becomes used to the medicine. This does NOT mean you are addicted. Addiction is a behavior related to getting and using a drug for a non-medical reason. If you have pain, you have a medical reason to take pain medicine. Your doctor will tell you how much medicine to take. If your doctor wants you to stop the medicine, the dose will be slowly lowered over time to avoid any side effects. You may get drowsy or dizzy. Do not drive, use machinery, or do anything that needs mental alertness until you know how this medicine affects you. Do not stand or sit up quickly, especially if you are an older patient. This reduces the risk of dizzy or fainting spells. Alcohol may interfere with the effect of this medicine. Avoid alcoholic drinks. There are different types of narcotic medicines (opiates) for pain. If you take more than one type at the same time, you may have more side effects. Give your health care provider a list of all medicines you use. Your doctor will tell you how much medicine to take. Do not take more medicine than directed. Call emergency for help if you have problems breathing. The medicine will cause constipation. Try to have a bowel movement at least every 2 to 3 days. If you do not have a bowel movement for 3 days, call your doctor or health care professional. Do not take Tylenol (acetaminophen) or medicines that have acetaminophen with this medicine. Too much acetaminophen can be very dangerous. Many nonprescription medicines contain acetaminophen. Always read the labels  carefully to avoid taking more acetaminophen. What side effects may I notice from receiving this medicine? Side effects that you should report to your doctor or health care professional as soon as possible: -allergic reactions like skin rash, itching or hives, swelling of the face, lips, or tongue -breathing difficulties, wheezing -confusion -light headedness or fainting spells -severe stomach pain -unusually weak or tired -yellowing of the skin or the whites of the eyes Side effects that usually do not require medical attention (report to your doctor or health care professional if they continue or are bothersome): -dizziness -drowsiness -nausea -vomiting This list may not describe all possible side effects. Call your doctor for medical advice about side effects. You may report side effects to FDA at 1-800-FDA-1088. Where should I keep my medicine? Keep out of the reach of children. This medicine can be abused. Keep your medicine in a safe place to protect it from theft. Do not share this medicine with anyone. Selling or giving away this medicine is dangerous and against the law. Store at room temperature between 20 and 25 degrees C (68 and 77 degrees F). Keep container  tightly closed. Protect from light. This medicine may cause accidental overdose and death if it is taken by other adults, children, or pets. Flush any unused medicine down the toilet to reduce the chance of harm. Do not use the medicine after the expiration date. NOTE: This sheet is a summary. It may not cover all possible information. If you have questions about this medicine, talk to your doctor, pharmacist, or health care provider.  2015, Elsevier/Gold Standard. (2013-02-01 13:17:35)

## 2014-09-14 ENCOUNTER — Inpatient Hospital Stay (HOSPITAL_COMMUNITY)
Admission: EM | Admit: 2014-09-14 | Discharge: 2014-09-15 | DRG: 638 | Disposition: A | Discharge status: Home / Self Care | Payer: Medicare Other | Attending: Internal Medicine | Admitting: Internal Medicine

## 2014-09-14 ENCOUNTER — Observation Stay (HOSPITAL_COMMUNITY): Payer: Medicare Other

## 2014-09-14 ENCOUNTER — Emergency Department (HOSPITAL_COMMUNITY): Payer: Medicare Other

## 2014-09-14 ENCOUNTER — Encounter (HOSPITAL_COMMUNITY): Payer: Self-pay | Admitting: *Deleted

## 2014-09-14 DIAGNOSIS — Z9049 Acquired absence of other specified parts of digestive tract: Secondary | ICD-10-CM | POA: Diagnosis present

## 2014-09-14 DIAGNOSIS — R739 Hyperglycemia, unspecified: Secondary | ICD-10-CM | POA: Diagnosis not present

## 2014-09-14 DIAGNOSIS — R110 Nausea: Secondary | ICD-10-CM | POA: Diagnosis not present

## 2014-09-14 DIAGNOSIS — E871 Hypo-osmolality and hyponatremia: Secondary | ICD-10-CM | POA: Diagnosis present

## 2014-09-14 DIAGNOSIS — F329 Major depressive disorder, single episode, unspecified: Secondary | ICD-10-CM | POA: Diagnosis present

## 2014-09-14 DIAGNOSIS — Z794 Long term (current) use of insulin: Secondary | ICD-10-CM | POA: Diagnosis not present

## 2014-09-14 DIAGNOSIS — E11628 Type 2 diabetes mellitus with other skin complications: Secondary | ICD-10-CM | POA: Diagnosis present

## 2014-09-14 DIAGNOSIS — E1169 Type 2 diabetes mellitus with other specified complication: Secondary | ICD-10-CM | POA: Diagnosis present

## 2014-09-14 DIAGNOSIS — E1165 Type 2 diabetes mellitus with hyperglycemia: Principal | ICD-10-CM | POA: Diagnosis present

## 2014-09-14 DIAGNOSIS — M545 Low back pain, unspecified: Secondary | ICD-10-CM | POA: Insufficient documentation

## 2014-09-14 DIAGNOSIS — G8929 Other chronic pain: Secondary | ICD-10-CM | POA: Diagnosis present

## 2014-09-14 DIAGNOSIS — I10 Essential (primary) hypertension: Secondary | ICD-10-CM | POA: Diagnosis present

## 2014-09-14 DIAGNOSIS — K219 Gastro-esophageal reflux disease without esophagitis: Secondary | ICD-10-CM | POA: Diagnosis present

## 2014-09-14 DIAGNOSIS — M549 Dorsalgia, unspecified: Secondary | ICD-10-CM

## 2014-09-14 DIAGNOSIS — E785 Hyperlipidemia, unspecified: Secondary | ICD-10-CM | POA: Diagnosis present

## 2014-09-14 DIAGNOSIS — F419 Anxiety disorder, unspecified: Secondary | ICD-10-CM | POA: Diagnosis present

## 2014-09-14 DIAGNOSIS — Z9119 Patient's noncompliance with other medical treatment and regimen: Secondary | ICD-10-CM | POA: Diagnosis present

## 2014-09-14 DIAGNOSIS — E118 Type 2 diabetes mellitus with unspecified complications: Secondary | ICD-10-CM | POA: Insufficient documentation

## 2014-09-14 DIAGNOSIS — R52 Pain, unspecified: Secondary | ICD-10-CM | POA: Diagnosis not present

## 2014-09-14 DIAGNOSIS — Z888 Allergy status to other drugs, medicaments and biological substances status: Secondary | ICD-10-CM | POA: Diagnosis not present

## 2014-09-14 LAB — COMPREHENSIVE METABOLIC PANEL
ALBUMIN: 4.1 g/dL (ref 3.5–5.2)
ALBUMIN: 3.7 g/dL (ref 3.5–5.2)
ALK PHOS: 72 U/L (ref 39–117)
ALK PHOS: 61 U/L (ref 39–117)
ALT: 21 U/L (ref 0–53)
ALT: 18 U/L (ref 0–53)
ANION GAP: 12 (ref 5–15)
AST: 20 U/L (ref 0–37)
AST: 18 U/L (ref 0–37)
Anion gap: 10 (ref 5–15)
BUN: 17 mg/dL (ref 6–23)
BUN: 16 mg/dL (ref 6–23)
CALCIUM: 8.6 mg/dL (ref 8.4–10.5)
CO2: 20 mmol/L (ref 19–32)
CO2: 21 mmol/L (ref 19–32)
Calcium: 8.8 mg/dL (ref 8.4–10.5)
Chloride: 86 mmol/L — ABNORMAL LOW (ref 96–112)
Chloride: 97 mmol/L (ref 96–112)
Creatinine, Ser: 1.11 mg/dL (ref 0.50–1.35)
Creatinine, Ser: 0.91 mg/dL (ref 0.50–1.35)
GFR calc Af Amer: >90 mL/min (ref >90)
GFR calc non Af Amer: 83 mL/min — ABNORMAL LOW (ref >90)
GFR calc non Af Amer: >90 mL/min (ref >90)
GLUCOSE: 983 mg/dL — AB (ref 70–99)
Glucose, Bld: 508 mg/dL — ABNORMAL HIGH (ref 70–99)
POTASSIUM: 4.7 mmol/L (ref 3.5–5.1)
Potassium: 3.8 mmol/L (ref 3.5–5.1)
SODIUM: 118 mmol/L — AB (ref 135–145)
Sodium: 128 mmol/L — ABNORMAL LOW (ref 135–145)
TOTAL PROTEIN: 6.9 g/dL (ref 6.0–8.3)
Total Bilirubin: 0.7 mg/dL (ref 0.3–1.2)
Total Bilirubin: 0.6 mg/dL (ref 0.3–1.2)
Total Protein: 7.6 g/dL (ref 6.0–8.3)

## 2014-09-14 LAB — URINE MICROSCOPIC-ADD ON

## 2014-09-14 LAB — CBG MONITORING, ED
GLUCOSE-CAPILLARY: 562 mg/dL — AB (ref 70–99)
Glucose-Capillary: >600 mg/dL (ref 70–99)

## 2014-09-14 LAB — CBC WITH DIFFERENTIAL/PLATELET
BASOS PCT: 0 % (ref 0–1)
BASOS PCT: 0 % (ref 0–1)
Basophils Absolute: 0 10*3/uL (ref 0.0–0.1)
Basophils Absolute: 0 10*3/uL (ref 0.0–0.1)
EOS ABS: 0 10*3/uL (ref 0.0–0.7)
Eosinophils Absolute: 0 10*3/uL (ref 0.0–0.7)
Eosinophils Relative: 0 % (ref 0–5)
Eosinophils Relative: 0 % (ref 0–5)
HCT: 36.6 % — ABNORMAL LOW (ref 39.0–52.0)
HCT: 32.9 % — ABNORMAL LOW (ref 39.0–52.0)
Hemoglobin: 12.1 g/dL — ABNORMAL LOW (ref 13.0–17.0)
Hemoglobin: 11.3 g/dL — ABNORMAL LOW (ref 13.0–17.0)
LYMPHS ABS: 0.7 10*3/uL (ref 0.7–4.0)
Lymphocytes Relative: 10 % — ABNORMAL LOW (ref 12–46)
Lymphocytes Relative: 17 % (ref 12–46)
Lymphs Abs: 0.4 10*3/uL — ABNORMAL LOW (ref 0.7–4.0)
MCH: 27.6 pg (ref 26.0–34.0)
MCH: 27 pg (ref 26.0–34.0)
MCHC: 33.1 g/dL (ref 30.0–36.0)
MCHC: 34.3 g/dL (ref 30.0–36.0)
MCV: 83.6 fL (ref 78.0–100.0)
MCV: 78.7 fL (ref 78.0–100.0)
Monocytes Absolute: 0 10*3/uL — ABNORMAL LOW (ref 0.1–1.0)
Monocytes Absolute: 0 10*3/uL — ABNORMAL LOW (ref 0.1–1.0)
Monocytes Relative: 1 % — ABNORMAL LOW (ref 3–12)
Monocytes Relative: 1 % — ABNORMAL LOW (ref 3–12)
NEUTROS PCT: 82 % — AB (ref 43–77)
Neutro Abs: 3.2 10*3/uL (ref 1.7–7.7)
Neutro Abs: 3.5 10*3/uL (ref 1.7–7.7)
Neutrophils Relative %: 89 % — ABNORMAL HIGH (ref 43–77)
PLATELETS: 212 10*3/uL (ref 150–400)
PLATELETS: 222 10*3/uL (ref 150–400)
RBC: 4.38 MIL/uL (ref 4.22–5.81)
RBC: 4.18 MIL/uL — AB (ref 4.22–5.81)
RDW: 12.7 % (ref 11.5–15.5)
RDW: 12.7 % (ref 11.5–15.5)
WBC: 3.6 10*3/uL — ABNORMAL LOW (ref 4.0–10.5)
WBC: 4.3 10*3/uL (ref 4.0–10.5)

## 2014-09-14 LAB — GLUCOSE, CAPILLARY
GLUCOSE-CAPILLARY: 300 mg/dL — AB (ref 70–99)
Glucose-Capillary: 474 mg/dL — ABNORMAL HIGH (ref 70–99)
Glucose-Capillary: 415 mg/dL — ABNORMAL HIGH (ref 70–99)
Glucose-Capillary: 385 mg/dL — ABNORMAL HIGH (ref 70–99)
Glucose-Capillary: 261 mg/dL — ABNORMAL HIGH (ref 70–99)
Glucose-Capillary: 295 mg/dL — ABNORMAL HIGH (ref 70–99)
Glucose-Capillary: 267 mg/dL — ABNORMAL HIGH (ref 70–99)

## 2014-09-14 LAB — URINALYSIS, ROUTINE W REFLEX MICROSCOPIC
Bilirubin Urine: NEGATIVE
Hgb urine dipstick: NEGATIVE
KETONES UR: NEGATIVE mg/dL
Leukocytes, UA: NEGATIVE
Nitrite: NEGATIVE
PH: 5.5 (ref 5.0–8.0)
PROTEIN: NEGATIVE mg/dL
Specific Gravity, Urine: <1.005 — ABNORMAL LOW (ref 1.005–1.030)
Urobilinogen, UA: 0.2 mg/dL (ref 0.0–1.0)

## 2014-09-14 LAB — MRSA PCR SCREENING: MRSA by PCR: NEGATIVE

## 2014-09-14 MED ORDER — ONDANSETRON HCL 4 MG/2ML IJ SOLN: 4.0000 mg | Freq: Four times a day (QID) | INTRAMUSCULAR | Status: DC | PRN

## 2014-09-14 MED ORDER — INSULIN REGULAR HUMAN 100 UNIT/ML IJ SOLN
INTRAMUSCULAR | Status: DC
  Administered 2014-09-14: 9.2 [IU]/h via INTRAVENOUS
  Filled 2014-09-14: qty 2.5

## 2014-09-14 MED ORDER — ESCITALOPRAM OXALATE 10 MG PO TABS
10.0000 mg | ORAL_TABLET | Freq: Every day | ORAL | Status: DC
  Administered 2014-09-14: 10 mg via ORAL
  Filled 2014-09-14: qty 1

## 2014-09-14 MED ORDER — ONDANSETRON HCL 4 MG PO TABS: 4.0000 mg | ORAL_TABLET | Freq: Four times a day (QID) | ORAL | Status: DC | PRN

## 2014-09-14 MED ORDER — HYDROCODONE-ACETAMINOPHEN 5-325 MG PO TABS
1.0000 | ORAL_TABLET | Freq: Once | ORAL | Status: AC
Start: 2014-09-14 — End: 2014-09-14
  Administered 2014-09-14: 1 via ORAL
  Filled 2014-09-14: qty 1

## 2014-09-14 MED ORDER — SODIUM CHLORIDE 0.9 % IV BOLUS (SEPSIS)
1000.0000 mL | Freq: Once | INTRAVENOUS | Status: AC
  Administered 2014-09-14: 1000 mL via INTRAVENOUS

## 2014-09-14 MED ORDER — INSULIN ASPART 100 UNIT/ML ~~LOC~~ SOLN
10.0000 [IU] | Freq: Once | SUBCUTANEOUS | Status: AC
  Administered 2014-09-14: 10 [IU] via SUBCUTANEOUS
  Filled 2014-09-14: qty 1

## 2014-09-14 MED ORDER — INSULIN ASPART 100 UNIT/ML ~~LOC~~ SOLN
0.0000 [IU] | Freq: Every day | SUBCUTANEOUS | Status: DC
  Administered 2014-09-14: 2 [IU] via SUBCUTANEOUS

## 2014-09-14 MED ORDER — ESCITALOPRAM OXALATE 10 MG PO TABS
20.0000 mg | ORAL_TABLET | Freq: Every day | ORAL | Status: DC
  Administered 2014-09-15: 20 mg via ORAL
  Filled 2014-09-14: qty 2

## 2014-09-14 MED ORDER — INSULIN GLARGINE 100 UNIT/ML ~~LOC~~ SOLN
62.0000 [IU] | Freq: Once | SUBCUTANEOUS | Status: AC
  Administered 2014-09-14: 62 [IU] via SUBCUTANEOUS
  Filled 2014-09-14: qty 0.62

## 2014-09-14 MED ORDER — OXYCODONE HCL 5 MG PO TABS
15.0000 mg | ORAL_TABLET | ORAL | Status: DC | PRN
  Administered 2014-09-14 – 2014-09-15 (×8): 15 mg via ORAL
  Filled 2014-09-14 (×8): qty 3

## 2014-09-14 MED ORDER — SODIUM CHLORIDE 0.9 % IV SOLN
INTRAVENOUS | Status: DC
  Filled 2014-09-14 (×2): qty 2.5

## 2014-09-14 MED ORDER — SODIUM CHLORIDE 0.9 % IV SOLN
INTRAVENOUS | Status: DC
  Administered 2014-09-14 – 2014-09-15 (×5): via INTRAVENOUS

## 2014-09-14 MED ORDER — HEPARIN SODIUM (PORCINE) 5000 UNIT/ML IJ SOLN
5000.0000 [IU] | Freq: Three times a day (TID) | INTRAMUSCULAR | Status: DC
  Administered 2014-09-14 – 2014-09-15 (×4): 5000 [IU] via SUBCUTANEOUS
  Filled 2014-09-14 (×4): qty 1

## 2014-09-14 MED ORDER — IBUPROFEN 400 MG PO TABS
600.0000 mg | ORAL_TABLET | Freq: Once | ORAL | Status: AC
  Administered 2014-09-14: 600 mg via ORAL
  Filled 2014-09-14: qty 2

## 2014-09-14 MED ORDER — OXYCODONE-ACETAMINOPHEN 5-325 MG PO TABS
1.0000 | ORAL_TABLET | ORAL | Status: DC | PRN
  Administered 2014-09-14: 1 via ORAL
  Filled 2014-09-14: qty 1

## 2014-09-14 MED ORDER — INSULIN ASPART 100 UNIT/ML ~~LOC~~ SOLN
0.0000 [IU] | Freq: Three times a day (TID) | SUBCUTANEOUS | Status: DC
  Administered 2014-09-15: 7 [IU] via SUBCUTANEOUS
  Administered 2014-09-15: 4 [IU] via SUBCUTANEOUS
  Administered 2014-09-15: 7 [IU] via SUBCUTANEOUS

## 2014-09-14 MED ORDER — DEXTROSE-NACL 5-0.45 % IV SOLN: INTRAVENOUS | Status: DC

## 2014-09-14 MED ORDER — PANTOPRAZOLE SODIUM 40 MG PO TBEC
40.0000 mg | DELAYED_RELEASE_TABLET | Freq: Every day | ORAL | Status: DC
  Administered 2014-09-14 – 2014-09-15 (×2): 40 mg via ORAL
  Filled 2014-09-14 (×2): qty 1

## 2014-09-14 MED ORDER — INSULIN REGULAR BOLUS VIA INFUSION
0.0000 [IU] | Freq: Three times a day (TID) | INTRAVENOUS | Status: DC
  Filled 2014-09-14: qty 10

## 2014-09-14 MED ORDER — MORPHINE SULFATE 2 MG/ML IJ SOLN
INTRAMUSCULAR | Status: AC
  Filled 2014-09-14: qty 2

## 2014-09-14 MED ORDER — CYCLOBENZAPRINE HCL 10 MG PO TABS
5.0000 mg | ORAL_TABLET | Freq: Once | ORAL | Status: AC
  Administered 2014-09-14: 5 mg via ORAL
  Filled 2014-09-14: qty 1

## 2014-09-14 MED ORDER — MORPHINE SULFATE 4 MG/ML IJ SOLN
4.0000 mg | Freq: Once | INTRAMUSCULAR | Status: AC
  Administered 2014-09-14: 4 mg via INTRAVENOUS

## 2014-09-14 MED ORDER — DEXTROSE 50 % IV SOLN: 25.0000 mL | INTRAVENOUS | Status: DC | PRN

## 2014-09-14 MED ORDER — CYCLOBENZAPRINE HCL 10 MG PO TABS
5.0000 mg | ORAL_TABLET | Freq: Three times a day (TID) | ORAL | Status: DC
  Administered 2014-09-14 – 2014-09-15 (×5): 5 mg via ORAL
  Filled 2014-09-14 (×5): qty 1

## 2014-09-14 MED ORDER — LISINOPRIL 10 MG PO TABS
20.0000 mg | ORAL_TABLET | Freq: Every day | ORAL | Status: DC
  Administered 2014-09-14 – 2014-09-15 (×2): 20 mg via ORAL
  Filled 2014-09-14 (×2): qty 2

## 2014-09-14 MED ORDER — INSULIN GLARGINE 100 UNIT/ML ~~LOC~~ SOLN: 62.0000 [IU] | Freq: Every day | SUBCUTANEOUS | Status: DC

## 2014-09-14 MED ORDER — AMLODIPINE BESYLATE 5 MG PO TABS
5.0000 mg | ORAL_TABLET | Freq: Every day | ORAL | Status: DC
  Administered 2014-09-14 – 2014-09-15 (×2): 5 mg via ORAL
  Filled 2014-09-14 (×2): qty 1

## 2014-09-14 NOTE — Progress Notes (Signed)
Inpatient Diabetes Program Recommendations  AACE/ADA: New Consensus Statement on Inpatient Glycemic Control (2013)  Target Ranges:  Prepandial:   less than 140 mg/dL      Peak postprandial:   less than 180 mg/dL (1-2 hours)      Critically ill patients:  140 - 180 mg/dL   Results for Christopher Raymond, Christopher Raymond (MRN [id number]) as of 09/14/2014 14:37  Ref. Range 09/14/2014 06:44 09/14/2014 07:47 09/14/2014 08:43 09/14/2014 09:43 09/14/2014 10:45  Glucose-Capillary Latest Range: 70-99 mg/dL 385 (H) 300 (H) 261 (H) 295 (H) 267 (H)   Diabetes history: DM2 Outpatient Diabetes medications: Lantus 0-13 units daily at lunch, 70/30 55 units with breakfast, 70/30 43 units at bedtime Current orders for Inpatient glycemic control: Novolin R insulin drip per GlucoStabilizer  Inpatient Diabetes Program Recommendations Insulin - Basal: When patient meets criteria to transition off IV insulin to SQ insulin, recommend ordering Lantus 62 units Q24H (based on 113 kg x 0.55 units). Correction (SSI): When patient meets criteria to transition off IV insulin to SQ insulin, recommend ordering Novolog resistant correction scale.  Note:Spoke with patient about diabetes and home regimen for diabetes control. Patient reports that he is followed by his PCP (Dr. Delorse Limber in [place], [place]) for diabetes management. Patient reports that he prefers to go to this particular doctor despite that he has to drive 4 hours to the doctor. Patient reports that he sees his doctor on a monthly basis. Currently he takes Lantus 0-13 units daily at lunch (sliding scale based on glucose), 70/30 55 units with breakfast, 70/30 43 units at bedtime as an outpatient for diabetes control. Patient reports that he has been on this insulin regimen for about 3 months and reports that his glucose has improved since being on this regimen up until he began getting boils about one month ago. According to the patient he checks his glucose 2-3 times per day and his  glucose is usually in the 200's mg/dl range. Patient states that over the past month he has been having multiple boils under his arm and his glucose has went up in response to the infection with the boils.  Discussed impact of nutrition, exercise, stress, sickness, and medications on diabetes control.  Inquired about initial glucose of 983 mg/dl and patient states that his glucose had not been running that high and that he was completely surprised that his glucose was 983 mg/dl on admission to the ED. Stressed importance of glycemic control to prevent potential complications from uncontrolled diabetes and also to enhance wound healing. Encouraged patient to continue to work with his PCP to improve glycemic control.   Patient verbalized understanding of information discussed and he states that he has no further questions at this time related to diabetes.   Thanks, Barnie Alderman, RN, MSN, CCRN, CDE Diabetes Coordinator Inpatient Diabetes Program [phone] (Team Pager from [place] to [place]) [phone] ([facility] office) [phone] [facility] office)

## 2014-09-14 NOTE — ED Notes (Signed)
Pt reports "feeling something pop" in his back after vomiting once this afternoon.  Pt denies difficulty with urination, or any nausea or vomiting at present time.

## 2014-09-14 NOTE — Progress Notes (Signed)
Patient admitted by Dr. Ernestina Patches earlier this morning  Patient seen and examined. His nausea and vomiting has resolved and he tolerated breakfast this morning. His back pain is chronic with acute component since vomiting started. Pain is only mildly better than yesterday.  Patient was started on glucose stabilizer for severe hyperglycemia. He did not have an anion gap or evidence of acidosis on initial blood tests. His blood sugars have since improved. Will continue insulin infusion since blood sugars are not in goal range. Continue IV fluids for hyperglycemia. Will transition to subcutaneous insulin onceBlood sugars are improved.  Hyponatremia related to severe hyperglycemia has improved with IV fluid administration and improvement in blood sugars.  He has chronic back pain and takes oxycodone 15 mg every 4 hours when necessary. He's had multiple back surgeries in the past. With acute on chronic onset of back pain, will check x-ray of the lumbar spine. He denies any trauma to this area/heavy lifting. Strength is 5 out of 5 in lower extremities bilaterally. Will get physical therapy consultation.  Larisa Lanius

## 2014-09-14 NOTE — ED Provider Notes (Signed)
CSN: [id number]     Arrival date & time 09/14/14  0101 History   First MD Initiated Contact with Patient 09/14/14 0107     Chief Complaint  Patient presents with  . Back Pain     (Consider location/radiation/quality/duration/timing/severity/associated sxs/prior Treatment) HPI  This is a 39 year old male with a history of diabetes, hypertension who presents with back pain. Patient reports that he vomited earlier today forcefully and "pull out my back." He reports 15 out of 10 pain that radiates to his bilateral lower extremities. The pain is all the way across his back and achy in nature. It is worse with staying still. Denies any weakness, numbness, tingling, bowel, or bladder issues. He is not taking anything for the pain. Reports pain with ambulation as well. Regarding the patient's vomiting, patient states that he vomits "every once in a while" and "they think it's related to diabetes."  Patient reports that his sugars have been "okay." Denies any recent fevers or steroid use.  Denies any urinary symptoms.  Past Medical History  Diagnosis Date  . GERD (gastroesophageal reflux disease)   . Diabetes mellitus   . HTN (hypertension)   . Depression   . Anxiety   . Chronic pain   . ED (erectile dysfunction)   . Hyperlipidemia   . Diabetic foot ulcer    Past Surgical History  Procedure Laterality Date  . Knee surgery    . Back surgery    . Cholecystectomy  2009  . Tonsillectomy    . Cholecystectomy open    . Wound debridement Left 10/05/2013    Procedure: INCISION AND DEBRIDEMENT LEFT FOOT;  Surgeon: Jamesetta So, MD;  Location: [facility];  Service: General;  Laterality: Left;   Family History  Problem Relation Age of Onset  . Coronary artery disease Father   . Heart attack Father   . Hypertension Mother    History  Substance Use Topics  . Smoking status: Never Smoker   . Smokeless tobacco: Current User    Types: Snuff     Comment: dip x 20  . Alcohol Use: Yes     Comment:  very little 4-5 beers/yr    Review of Systems  Constitutional: Negative.  Negative for fever.  Respiratory: Negative.  Negative for chest tightness and shortness of breath.   Cardiovascular: Negative.  Negative for chest pain.  Gastrointestinal: Positive for vomiting. Negative for nausea and abdominal pain.  Genitourinary: Negative.  Negative for dysuria and difficulty urinating.  Musculoskeletal: Positive for back pain.  Neurological: Negative for weakness and numbness.  All other systems reviewed and are negative.     Allergies  Levaquin  Home Medications   Prior to Admission medications   Medication Sig Start Date End Date Taking? Authorizing Provider  amLODipine (NORVASC) 10 MG tablet Take 0.5 tablets (5 mg total) by mouth daily. 12/05/13   Nita Sells, MD  cephALEXin (KEFLEX) 500 MG capsule Take 1 capsule (500 mg total) by mouth 4 (four) times daily. 08/03/14   Veryl Speak, MD  doxycycline (VIBRAMYCIN) 100 MG capsule Take 1 capsule (100 mg total) by mouth 2 (two) times daily. 0/08/67   Delora Fuel, MD  escitalopram (LEXAPRO) 10 MG tablet Take 1 tablet (10 mg total) by mouth daily. 10/23/12   Mikey Kirschner, MD  esomeprazole (NEXIUM) 40 MG capsule Take 1 capsule (40 mg total) by mouth daily before breakfast. 03/30/13   Julianne Rice, MD  HYDROcodone-acetaminophen (NORCO/VICODIN) 5-325 MG per tablet Take 1-2 tablets by  mouth every 6 (six) hours as needed. 08/03/14   Veryl Speak, MD  insulin aspart protamine- aspart (NOVOLOG MIX 70/30) (70-30) 100 UNIT/ML injection Inject 0.55 mLs (55 Units total) into the skin daily with breakfast. 12/05/13   Nita Sells, MD  insulin glargine (LANTUS) 100 UNIT/ML injection Inject 0.13 mLs (13 Units total) into the skin at bedtime. Patient taking differently: Inject 0-13 Units into the skin at bedtime. Sliding Scale 11/26/13   Eugenie Filler, MD  lisinopril (PRINIVIL,ZESTRIL) 20 MG tablet Take 20 mg by mouth daily.    Historical  Provider, MD  naproxen (NAPROSYN) 500 MG tablet Take 1 tablet (500 mg total) by mouth 2 (two) times daily with a meal. 05/30/14   Noemi Chapel, MD  oxyCODONE-acetaminophen (PERCOCET/ROXICET) 5-325 MG per tablet Take 1 tablet by mouth every 4 (four) hours as needed. 4/49/67   Delora Fuel, MD   BP 591/63 mmHg  Pulse 104  Temp(Src) 98.7 F (37.1 C) (Oral)  Resp 16  Ht 6\' 1"  (1.854 m)  Wt 250 lb (113.399 kg)  BMI 32.99 kg/m2  SpO2 98% Physical Exam  Constitutional: He is oriented to person, place, and time. He appears well-developed and well-nourished. No distress.  HENT:  Head: Normocephalic and atraumatic.  Cardiovascular: Normal rate, regular rhythm and normal heart sounds.   No murmur heard. Pulmonary/Chest: Effort normal and breath sounds normal. No respiratory distress. He has no wheezes.  Abdominal: Soft. Bowel sounds are normal. There is no tenderness. There is no rebound.  Musculoskeletal: He exhibits no edema.  Tenderness to palpation across the lower back over the paraspinous muscle regions of the lumbar spine bilaterally, no midline tenderness to palpation, muscle spasm noted  Neurological: He is alert and oriented to person, place, and time.  5 out of 5 strength bilateral lower extremities, normal reflexes  Skin: Skin is warm and dry.  Psychiatric: He has a normal mood and affect.  Nursing note and vitals reviewed.   ED Course  Procedures (including critical care time)  CRITICAL CARE Performed by: Merryl Hacker   Total critical care time: 35 min  Critical care time was exclusive of separately billable procedures and treating other patients.  Critical care was necessary to treat or prevent imminent or life-threatening deterioration.  Critical care was time spent personally by me on the following activities: development of treatment plan with patient and/or surrogate as well as nursing, discussions with consultants, evaluation of patient's response to treatment,  examination of patient, obtaining history from patient or surrogate, ordering and performing treatments and interventions, ordering and review of laboratory studies, ordering and review of radiographic studies, pulse oximetry and re-evaluation of patient's condition.  Labs Review Labs Reviewed  CBC WITH DIFFERENTIAL/PLATELET - Abnormal; Notable for the following:    WBC 3.6 (*)    Hemoglobin 12.1 (*)    HCT 36.6 (*)    Neutrophils Relative % 89 (*)    Lymphocytes Relative 10 (*)    Lymphs Abs 0.4 (*)    Monocytes Relative 1 (*)    Monocytes Absolute 0.0 (*)    All other components within normal limits  COMPREHENSIVE METABOLIC PANEL - Abnormal; Notable for the following:    Sodium 118 (*)    Chloride 86 (*)    Glucose, Bld 983 (*)    GFR calc non Af Amer 83 (*)    All other components within normal limits  CBG MONITORING, ED - Abnormal; Notable for the following:    Glucose-Capillary >600 (*)  All other components within normal limits  URINALYSIS, ROUTINE W REFLEX MICROSCOPIC    Imaging Review No results found.   EKG Interpretation   Date/Time:  Wednesday September 14 2014 02:41:42 EDT Ventricular Rate:  89 PR Interval:  158 QRS Duration: 85 QT Interval:  352 QTC Calculation: 428 R Axis:   82 Text Interpretation:  Sinus rhythm Consider left atrial enlargement ST  elev, probable normal early repol pattern No significant change since last  tracing Confirmed by HORTON  MD, COURTNEY ([id number]) on 09/14/2014 2:53:58 AM      MDM   Final diagnoses:  Hyperglycemia  Bilateral low back pain without sciatica    Patient presents with complaints of lower back pain. Noted onset of pain after an episode of vomiting and feels like he may have pulled something in his back. Nontoxic on exam. No signs or symptoms of cauda equina.  He has musculoskeletal tenderness over lumbar spine. Screening CBG noted to be greater than 600. Given this, basic labwork sent and patient given normal saline  and 10 units of subcutaneous insulin. Patient now admits that he has not taken his insulin since yesterday. CMP notable for a blood sugar of 983 and a sodium of 118. No evidence of anion gap. Patient does not appear to be an acute DKA; however, given severity of hyperglycemia will place on insulin drip. Second liter of fluids ordered. EKG, urinalysis, and chest x-ray added to workup. EKG shows early repolarization pot pattern which was present on prior EKG but is more prominent now. Urinalysis and chest x-ray pending. Discussed with Romona Curls, hospitalist, who will evaluate for admission.    Merryl Hacker, MD 09/14/14 [phone]

## 2014-09-14 NOTE — H&P (Signed)
Hospitalist Admission History and Physical  Patient name: Christopher Raymond Medical record number: [id number] Date of birth: October 17, 1975 Age: 39 y.o. Gender: male  Primary Care Provider: Jacqualine Code, DO  Chief Complaint: hyperglycemia   History of Present Illness:This is a 39 y.o. year old male with significant past medical history of IDDM, HTN, GERD, chronic low back pain  presenting with hyperglycemia. Pt reports one episode of intense emesis (NBNB) earlier today. States that he had severe low back pain from that point that persisted over course of the day. No abd pain. No dysuria. Denies any fevers or chills. Baseline insulin dependent diabetic. Reports compliance apart from earlier today 2/2 emesis. Presented to ER because of back pain. Afebrile. Hemodynamically stable. Noted glu 983. Bicarb 20. UA pending.   Assessment and Plan: Christopher Raymond is a 39 y.o. year old male presenting with Hyperglycemia   Active Problems:   Hyperglycemia   1- Hyperglycemia -not acidosis on bmet -UA pending -glucomander -IVFs -A1C -suspect noncompliance given CBG and medical history -follow  2- HTN -BP stable  -cont home regimen   3-GERD -PPI   4-Hyponatremia -corrected Na @ 135 in setting of hyperglycemia -follow   FEN/GI: heart healthy-carb modified  Prophylaxis: sub q heparin  Disposition: pending further evaluation  Raymond Status:Full Raymond    Patient Active Problem List   Diagnosis Date Noted  . Pilonidal cyst 02/28/2014  . Osteomyelitis 12/03/2013  . Acute osteomyelitis of foot 10/06/2013  . Diabetic osteomyelitis 10/05/2013  . Diabetic foot ulcer 09/29/2013  . Hyponatremia 09/29/2013  . Hyperglycemia 09/29/2013  . Non-healing ulcer 06/26/2013  . Diabetic foot infection 06/20/2013  . Essential hypertension, benign 09/18/2012  . Diabetes 09/18/2012  . Chronic low back pain 09/18/2012  . Cellulitis 09/18/2012  . Rectal bleed 01/22/2011  . GERD (gastroesophageal  reflux disease) 01/22/2011  . Vomiting 01/22/2011   Past Medical History: Past Medical History  Diagnosis Date  . GERD (gastroesophageal reflux disease)   . Diabetes mellitus   . HTN (hypertension)   . Depression   . Anxiety   . Chronic pain   . ED (erectile dysfunction)   . Hyperlipidemia   . Diabetic foot ulcer     Past Surgical History: Past Surgical History  Procedure Laterality Date  . Knee surgery    . Back surgery    . Cholecystectomy  2009  . Tonsillectomy    . Cholecystectomy open    . Wound debridement Left 10/05/2013    Procedure: INCISION AND DEBRIDEMENT LEFT FOOT;  Surgeon: Christopher So, MD;  Location: [facility];  Service: General;  Laterality: Left;    Social History: History   Social History  . Marital Status: Legally Separated    Spouse Name: N/A  . Number of Children: 0  . Years of Education: N/A   Occupational History  . disabled     previously warehouse work   Social History Main Topics  . Smoking status: Never Smoker   . Smokeless tobacco: Current User    Types: Snuff     Comment: dip x 20  . Alcohol Use: Yes     Comment: very little 4-5 beers/yr  . Drug Use: No  . Sexual Activity: Yes   Other Topics Concern  . None   Social History Narrative    Family History: Family History  Problem Relation Age of Onset  . Coronary artery disease Father   . Heart attack Father   . Hypertension Mother     Allergies:  Allergies  Allergen Reactions  . Levaquin [Levofloxacin] Rash    Current Facility-Administered Medications  Medication Dose Route Frequency Provider Last Rate Last Dose  . 0.9 %  sodium chloride infusion   Intravenous Continuous Christopher Lever, MD      . amLODipine (NORVASC) tablet 5 mg  5 mg Oral Daily Christopher Lever, MD      . dextrose 5 %-0.45 % sodium chloride infusion   Intravenous Continuous Christopher Hacker, MD      . dextrose 5 %-0.45 % sodium chloride infusion   Intravenous Continuous Christopher Lever, MD      .  dextrose 50 % solution 25 mL  25 mL Intravenous PRN Christopher Lever, MD      . escitalopram (LEXAPRO) tablet 10 mg  10 mg Oral Daily Christopher Lever, MD      . heparin injection 5,000 Units  5,000 Units Subcutaneous 3 times per day Christopher Lever, MD      . insulin regular (NOVOLIN R,HUMULIN R) 250 Units in sodium chloride 0.9 % 250 mL (1 Units/mL) infusion   Intravenous Continuous Christopher Hacker, MD 9.2 mL/hr at 09/14/14 0254 9.2 Units/hr at 09/14/14 0254  . insulin regular (NOVOLIN R,HUMULIN R) 250 Units in sodium chloride 0.9 % 250 mL (1 Units/mL) infusion   Intravenous Continuous Christopher Lever, MD      . insulin regular bolus via infusion 0-10 Units  0-10 Units Intravenous TID WC Christopher Lever, MD      . lisinopril (PRINIVIL,ZESTRIL) tablet 20 mg  20 mg Oral Daily Christopher Lever, MD      . morphine 2 MG/ML injection           . ondansetron (ZOFRAN) tablet 4 mg  4 mg Oral Q6H PRN Christopher Lever, MD       Or  . ondansetron [facility]) injection 4 mg  4 mg Intravenous Q6H PRN Christopher Lever, MD      . pantoprazole sodium (PROTONIX) 40 mg/20 mL oral suspension 40 mg  40 mg Oral Daily Christopher Lever, MD       Current Outpatient Prescriptions  Medication Sig Dispense Refill  . amLODipine (NORVASC) 10 MG tablet Take 0.5 tablets (5 mg total) by mouth daily. 31 tablet 0  . cephALEXin (KEFLEX) 500 MG capsule Take 1 capsule (500 mg total) by mouth 4 (four) times daily. 28 capsule 0  . doxycycline (VIBRAMYCIN) 100 MG capsule Take 1 capsule (100 mg total) by mouth 2 (two) times daily. 20 capsule 0  . escitalopram (LEXAPRO) 10 MG tablet Take 1 tablet (10 mg total) by mouth daily. 30 tablet 5  . esomeprazole (NEXIUM) 40 MG capsule Take 1 capsule (40 mg total) by mouth daily before breakfast. 30 capsule 0  . HYDROcodone-acetaminophen (NORCO/VICODIN) 5-325 MG per tablet Take 1-2 tablets by mouth every 6 (six) hours as needed. 12 tablet 0  . insulin aspart protamine- aspart (NOVOLOG MIX 70/30)  (70-30) 100 UNIT/ML injection Inject 0.55 mLs (55 Units total) into the skin daily with breakfast. 10 mL 0  . insulin glargine (LANTUS) 100 UNIT/ML injection Inject 0.13 mLs (13 Units total) into the skin at bedtime. (Patient taking differently: Inject 0-13 Units into the skin at bedtime. Sliding Scale) 10 mL 0  . lisinopril (PRINIVIL,ZESTRIL) 20 MG tablet Take 20 mg by mouth daily.    . naproxen (NAPROSYN) 500 MG tablet Take 1 tablet (500 mg total) by mouth 2 (two) times daily  with a meal. 30 tablet 0  . oxyCODONE-acetaminophen (PERCOCET/ROXICET) 5-325 MG per tablet Take 1 tablet by mouth every 4 (four) hours as needed. 15 tablet 0   Review Of Systems: 12 point ROS negative except as noted above in HPI.  Physical Exam: Filed Vitals:   09/14/14 0107  BP: 156/89  Pulse: 104  Temp: 98.7 F (37.1 C)  Resp: 16    General: alert and cooperative HEENT: PERRLA and extra ocular movement intact Heart: S1, S2 normal, no murmur, rub or gallop, regular rate and rhythm Lungs: clear to auscultation, no wheezes or rales and unlabored breathing Abdomen: abdomen is soft without significant tenderness, masses, organomegaly or guarding Extremities: extremities normal, atraumatic, no cyanosis or edema Skin:no rashes Neurology: normal without focal findings  Labs and Imaging: Lab Results  Component Value Date/Time   NA 118* 09/14/2014 01:48 AM   K 4.7 09/14/2014 01:48 AM   CL 86* 09/14/2014 01:48 AM   CO2 20 09/14/2014 01:48 AM   BUN 17 09/14/2014 01:48 AM   CREATININE 1.11 09/14/2014 01:48 AM   GLUCOSE 983* 09/14/2014 01:48 AM   Lab Results  Component Value Date   WBC 3.6* 09/14/2014   HGB 12.1* 09/14/2014   HCT 36.6* 09/14/2014   MCV 83.6 09/14/2014   PLT 212 09/14/2014    No results found.         Shanda Howells MD  Pager: [phone]

## 2014-09-14 NOTE — Progress Notes (Signed)
PT Cancellation Note  Patient Details Name: Christopher Raymond MRN: [id number] DOB: 09-14-1975   Cancelled Treatment:    Reason Eval/Treat Not Completed: PT screened, no needs identified, will sign off.  Pt states that his back pain is significantly reduced with the muscle relaxant that he has taken.  He has been up walking around the nurses station with no difficulty per RN.  He has had PT in the past for chronic back pain and has been instructed in body mechanics, back exercise, etc.  No PT should be needed at this time.  If situation changes, please reconsult.   Demetrios Isaacs L 09/14/2014, 12:08 PM

## 2014-09-15 DIAGNOSIS — I10 Essential (primary) hypertension: Secondary | ICD-10-CM

## 2014-09-15 DIAGNOSIS — K219 Gastro-esophageal reflux disease without esophagitis: Secondary | ICD-10-CM | POA: Insufficient documentation

## 2014-09-15 DIAGNOSIS — E118 Type 2 diabetes mellitus with unspecified complications: Secondary | ICD-10-CM

## 2014-09-15 LAB — COMPREHENSIVE METABOLIC PANEL
ALK PHOS: 49 U/L (ref 39–117)
ALT: 16 U/L (ref 0–53)
AST: 16 U/L (ref 0–37)
Albumin: 3.1 g/dL — ABNORMAL LOW (ref 3.5–5.2)
Anion gap: 6 (ref 5–15)
BUN: 13 mg/dL (ref 6–23)
CO2: 24 mmol/L (ref 19–32)
Calcium: 7.9 mg/dL — ABNORMAL LOW (ref 8.4–10.5)
Chloride: 107 mmol/L (ref 96–112)
Creatinine, Ser: 0.69 mg/dL (ref 0.50–1.35)
GFR calc non Af Amer: >90 mL/min (ref >90)
GLUCOSE: 235 mg/dL — AB (ref 70–99)
POTASSIUM: 3.5 mmol/L (ref 3.5–5.1)
Sodium: 137 mmol/L (ref 135–145)
Total Bilirubin: 0.3 mg/dL (ref 0.3–1.2)
Total Protein: 5.7 g/dL — ABNORMAL LOW (ref 6.0–8.3)

## 2014-09-15 LAB — GLUCOSE, CAPILLARY
GLUCOSE-CAPILLARY: 197 mg/dL — AB (ref 70–99)
GLUCOSE-CAPILLARY: 95 mg/dL (ref 70–99)
GLUCOSE-CAPILLARY: 193 mg/dL — AB (ref 70–99)
Glucose-Capillary: 211 mg/dL — ABNORMAL HIGH (ref 70–99)
Glucose-Capillary: 178 mg/dL — ABNORMAL HIGH (ref 70–99)
Glucose-Capillary: 111 mg/dL — ABNORMAL HIGH (ref 70–99)
Glucose-Capillary: 161 mg/dL — ABNORMAL HIGH (ref 70–99)
Glucose-Capillary: 218 mg/dL — ABNORMAL HIGH (ref 70–99)
Glucose-Capillary: 226 mg/dL — ABNORMAL HIGH (ref 70–99)
Glucose-Capillary: 205 mg/dL — ABNORMAL HIGH (ref 70–99)
Glucose-Capillary: 186 mg/dL — ABNORMAL HIGH (ref 70–99)
Glucose-Capillary: 203 mg/dL — ABNORMAL HIGH (ref 70–99)

## 2014-09-15 LAB — CBC WITH DIFFERENTIAL/PLATELET
Basophils Absolute: 0 10*3/uL (ref 0.0–0.1)
Basophils Relative: 0 % (ref 0–1)
Eosinophils Absolute: 0 10*3/uL (ref 0.0–0.7)
Eosinophils Relative: 1 % (ref 0–5)
HCT: 31.7 % — ABNORMAL LOW (ref 39.0–52.0)
HEMOGLOBIN: 10.8 g/dL — AB (ref 13.0–17.0)
LYMPHS PCT: 37 % (ref 12–46)
Lymphs Abs: 1.9 10*3/uL (ref 0.7–4.0)
MCH: 27.5 pg (ref 26.0–34.0)
MCHC: 34.1 g/dL (ref 30.0–36.0)
MCV: 80.7 fL (ref 78.0–100.0)
Monocytes Absolute: 0.3 10*3/uL (ref 0.1–1.0)
Monocytes Relative: 6 % (ref 3–12)
Neutro Abs: 2.8 10*3/uL (ref 1.7–7.7)
Neutrophils Relative %: 56 % (ref 43–77)
PLATELETS: 202 10*3/uL (ref 150–400)
RBC: 3.93 MIL/uL — AB (ref 4.22–5.81)
RDW: 13.2 % (ref 11.5–15.5)
WBC: 5 10*3/uL (ref 4.0–10.5)

## 2014-09-15 LAB — HEMOGLOBIN A1C
Hgb A1c MFr Bld: 15.7 % — ABNORMAL HIGH (ref 4.8–5.6)
Mean Plasma Glucose: 404 mg/dL

## 2014-09-15 MED ORDER — INSULIN GLARGINE 100 UNIT/ML ~~LOC~~ SOLN
70.0000 [IU] | Freq: Every day | SUBCUTANEOUS | Status: DC
  Administered 2014-09-15: 70 [IU] via SUBCUTANEOUS
  Filled 2014-09-15: qty 0.7

## 2014-09-15 MED ORDER — INSULIN ASPART 100 UNIT/ML FLEXPEN: 5.0000 [IU] | PEN_INJECTOR | Freq: Three times a day (TID) | SUBCUTANEOUS | Status: AC

## 2014-09-15 MED ORDER — INSULIN ASPART 100 UNIT/ML ~~LOC~~ SOLN
5.0000 [IU] | Freq: Three times a day (TID) | SUBCUTANEOUS | Status: DC
  Administered 2014-09-15 (×3): 5 [IU] via SUBCUTANEOUS

## 2014-09-15 MED ORDER — CYCLOBENZAPRINE HCL 5 MG PO TABS: 5.0000 mg | ORAL_TABLET | Freq: Three times a day (TID) | ORAL | Status: DC | PRN

## 2014-09-15 MED ORDER — INSULIN GLARGINE 100 UNIT/ML SOLOSTAR PEN: 70.0000 [IU] | PEN_INJECTOR | Freq: Every day | SUBCUTANEOUS | Status: DC

## 2014-09-15 NOTE — Progress Notes (Signed)
Patient's family here to transport patient home. Pt dc'd to home with family in safe and stable condition. Schonewitz, Eulis Canner 09/15/2014

## 2014-09-15 NOTE — Care Management Note (Signed)
    Page 1 of 1   09/15/2014     10:08:00 AM CARE MANAGEMENT NOTE 09/15/2014  Patient:  Christopher Raymond, Christopher Raymond   Account Number:  [id number]  Date Initiated:  09/15/2014  Documentation initiated by:  Jolene Provost  Subjective/Objective Assessment:   Pt is from home with self care and independnet with ADL's. Pt admitted for hyperglycemia. Pt will discharge home with self care today. No CM needs.     Action/Plan:   Anticipated DC Date:  09/15/2014   Anticipated DC Plan:  HOME/SELF CARE      DC Planning Services  CM consult      Choice offered to / List presented to:             Status of service:  Completed, signed off Medicare Important Message given?   (If response is "NO", the following Medicare IM given date fields will be blank) Date Medicare IM given:   Medicare IM given by:   Date Additional Medicare IM given:   Additional Medicare IM given by:    Discharge Disposition:  HOME/SELF CARE  Per UR Regulation:  Reviewed for med. necessity/level of care/duration of stay  If discussed at [place] of Stay Meetings, dates discussed:    Comments:  09/15/2014 [place], RN, MSN, CM

## 2014-09-15 NOTE — Progress Notes (Signed)
D.c instructions and moderate sliding scale for insulin coverage reviewed with patient. Verbalized understanding.  Pt to be d/c to home when family arrives to transport.  Schonewitz, Eulis Canner 09/15/2014

## 2014-09-15 NOTE — Discharge Summary (Signed)
Physician Discharge Summary  Christopher Raymond PJA:[id number] DOB: 1976/03/28 DOA: 09/14/2014  PCP: Jacqualine Code, DO  Admit date: 09/14/2014 Discharge date: 09/15/2014  Time spent: 40 minutes  Recommendations for Outpatient Follow-up:  1. Patient will follow up with primary care physician on 4/8 as previously scheduled  Discharge Diagnoses:  Active Problems:   Hyperglycemia   Bilateral low back pain without sciatica   Essential hypertension   Esophageal reflux   Type 2 diabetes mellitus with complication  hyperglycemic hyperosmolar nonketotic state Spasm of back muscles, improved Hyponatremia   Discharge Condition: improved  Diet recommendation: low carb  Filed Weights   09/14/14 0107 09/15/14 0500  Weight: 113.399 kg (250 lb) 116.5 kg (256 lb 13.4 oz)    History of present illness:  This patient was admitted to the hospital with emesis, worsening low back pain, and marked hyperglycemia with a blood sugar of 983.  Hospital Course:  Patient was admitted to the stepdown unit and started on IV insulin per glucose stabilizer protocol as well as intravenous hydration. With IV hydration and insulin, his blood sugars improved. Hyponatremia resolved since this was likely related to his hyperglycemia and he was transitioned to subcutaneous insulin. He's been continued on Lantus as well as NovoLog with meals. Her blood sugars have remained stable. He will follow-up with his primary care physician for further adjustment of his insulin regimen.  He did complain of significant low back pain that began after a vomiting episode. He reports chronic back issues, but this was a worsening pain. X-rays did not show any acute findings. He was started on Flexeril but significantly improved his symptoms suggesting a muscular etiology. He was not febrile and did not have any leukocytosis.  The remainder of his medical issues remained stable. He'll be discharged home to follow-up with his primary  care physician.  Procedures:    Consultations:    Discharge Exam: Filed Vitals:   09/15/14 1100  BP:   Pulse:   Temp: 97.8 F (36.6 C)  Resp:     General: NAD Cardiovascular: S1, S2 RRR Respiratory: CTA B  Discharge Instructions   Discharge Instructions    Call MD for:  persistant nausea and vomiting    Complete by:  As directed      Call MD for:  severe uncontrolled pain    Complete by:  As directed      Diet - low sodium heart healthy    Complete by:  As directed      Diet Carb Modified    Complete by:  As directed      Increase activity slowly    Complete by:  As directed           Current Discharge Medication List    START taking these medications   Details  cyclobenzaprine (FLEXERIL) 5 MG tablet Take 1 tablet (5 mg total) by mouth 3 (three) times daily as needed for muscle spasms. Qty: 30 tablet, Refills: 0    insulin aspart (NOVOLOG) 100 UNIT/ML FlexPen Inject 5 Units into the skin 3 (three) times daily with meals. Qty: 15 mL, Refills: 11    Insulin Glargine (LANTUS) 100 UNIT/ML Solostar Pen Inject 70 Units into the skin daily. At 5pm Qty: 15 mL, Refills: 11      CONTINUE these medications which have NOT CHANGED   Details  acetaminophen (TYLENOL) 500 MG tablet Take 1,000 mg by mouth every 6 (six) hours as needed for headache.    alprazolam (XANAX) 2 MG tablet  Take 2 mg by mouth at bedtime as needed for sleep.    amLODipine (NORVASC) 10 MG tablet Take 0.5 tablets (5 mg total) by mouth daily. Qty: 31 tablet, Refills: 0    escitalopram (LEXAPRO) 20 MG tablet Take 1 tablet by mouth daily.    esomeprazole (NEXIUM) 40 MG capsule Take 1 capsule (40 mg total) by mouth daily before breakfast. Qty: 30 capsule, Refills: 0    lisinopril (PRINIVIL,ZESTRIL) 20 MG tablet Take 20 mg by mouth daily.    oxyCODONE (ROXICODONE) 15 MG immediate release tablet Take 1 tablet by mouth every 4 (four) hours as needed for pain.  Refills: 0      STOP taking these  medications     insulin aspart protamine- aspart (NOVOLOG MIX 70/30) (70-30) 100 UNIT/ML injection      insulin glargine (LANTUS) 100 UNIT/ML injection      omeprazole (PRILOSEC) 40 MG capsule      cephALEXin (KEFLEX) 500 MG capsule      doxycycline (VIBRAMYCIN) 100 MG capsule      HYDROcodone-acetaminophen (NORCO/VICODIN) 5-325 MG per tablet      naproxen (NAPROSYN) 500 MG tablet      oxyCODONE-acetaminophen (PERCOCET/ROXICET) 5-325 MG per tablet        Allergies  Allergen Reactions  . Levaquin [Levofloxacin] Rash   Follow-up Information    Follow up with Jacqualine Code, DO On 09/30/2014.   Specialty:  Family Medicine   Why:  as scheduled   Contact information:   [place] [address] [phone]        The results of significant diagnostics from this hospitalization (including imaging, microbiology, ancillary and laboratory) are listed below for reference.    Significant Diagnostic Studies: Dg Chest 2 View  09/14/2014   CLINICAL DATA:  Back pain and nausea for 1 day.  EXAM: CHEST  2 VIEW  COMPARISON:  None.  FINDINGS: The cardiomediastinal contours are normal. Pulmonary vasculature is normal. No consolidation, pleural effusion, or pneumothorax. Mild broad-based rightward curvature of the thoracic spine. No acute osseous abnormalities are seen.  IMPRESSION: No acute pulmonary process.   Electronically Signed   By: Jeb Levering M.D.   On: 09/14/2014 03:48   Dg Lumbar Spine Complete  09/14/2014   CLINICAL DATA:  Vomiting, severe low back pain radiating down both legs.  EXAM: LUMBAR SPINE - COMPLETE 4+ VIEW  COMPARISON:  09/13/2014  FINDINGS: Mild leftward scoliosis. Prior lateral fusion at L3-4. No acute bony abnormality. No fracture or subluxation. Disc spaces are maintained. SI joints are symmetric and unremarkable.  IMPRESSION: Stable postoperative changes and levoscoliosis.  No acute findings.   Electronically Signed   By: Rolm Baptise  M.D.   On: 09/14/2014 15:05    Microbiology: Recent Results (from the past 240 hour(s))  MRSA PCR Screening     Status: None   Collection Time: 09/14/14  4:42 AM  Result Value Ref Range Status   MRSA by PCR NEGATIVE NEGATIVE Final    Comment:        The GeneXpert MRSA Assay (FDA approved for NASAL specimens only), is one component of a comprehensive MRSA colonization surveillance program. It is not intended to diagnose MRSA infection nor to guide or monitor treatment for MRSA infections.      Labs: Basic Metabolic Panel:  Recent Labs Lab 09/14/14 0148 09/14/14 0454 09/15/14 0420  NA 118* 128* 137  K 4.7 3.8 3.5  CL 86* 97 107  CO2 20 21  24  GLUCOSE 983* 508* 235*  BUN 17 16 13   CREATININE 1.11 0.91 0.69  CALCIUM 8.8 8.6 7.9*   Liver Function Tests:  Recent Labs Lab 09/14/14 0148 09/14/14 0454 09/15/14 0420  AST 20 18 16   ALT 21 18 16   ALKPHOS 72 61 49  BILITOT 0.7 0.6 0.3  PROT 7.6 6.9 5.7*  ALBUMIN 4.1 3.7 3.1*   No results for input(s): LIPASE, AMYLASE in the last 168 hours. No results for input(s): AMMONIA in the last 168 hours. CBC:  Recent Labs Lab 09/14/14 0148 09/14/14 0454 09/15/14 0420  WBC 3.6* 4.3 5.0  NEUTROABS 3.2 3.5 2.8  HGB 12.1* 11.3* 10.8*  HCT 36.6* 32.9* 31.7*  MCV 83.6 78.7 80.7  PLT 212 222 202   Cardiac Enzymes: No results for input(s): CKTOTAL, CKMB, CKMBINDEX, TROPONINI in the last 168 hours. BNP: BNP (last 3 results) No results for input(s): BNP in the last 8760 hours.  ProBNP (last 3 results) No results for input(s): PROBNP in the last 8760 hours.  CBG:  Recent Labs Lab 09/14/14 1844 09/14/14 2214 09/15/14 0745 09/15/14 1110 09/15/14 1609  GLUCAP 193* 226* 205* 186* 203*       Signed:  MEMON,JEHANZEB  [facility] 09/15/2014, 6:36 PM

## 2014-09-15 NOTE — Progress Notes (Signed)
Inpatient Diabetes Program Recommendations  AACE/ADA: New Consensus Statement on Inpatient Glycemic Control (2013)  Target Ranges:  Prepandial:   less than 140 mg/dL      Peak postprandial:   less than 180 mg/dL (1-2 hours)      Critically ill patients:  140 - 180 mg/dL  Results for OSTEN, Christopher Raymond (MRN [id number]) as of 09/15/2014 07:18  Ref. Range 09/15/2014 04:20  Glucose Latest Range: 70-99 mg/dL 235 (H)   Results for JAMESPAUL, Christopher Raymond (MRN [id number]) as of 09/15/2014 07:18  Ref. Range 09/14/2014 16:44 09/14/2014 18:01 09/14/2014 18:44 09/14/2014 22:14  Glucose-Capillary Latest Range: 70-99 mg/dL 161 (H) 218 (H) 193 (H) 226 (H)   Diabetes history: DM2 Outpatient Diabetes medications: Lantus 0-13 units daily at lunch, 70/30 55 units with breakfast, 70/30 43 units at bedtime Current orders for Inpatient glycemic control: Lantus 62 units QHS, Novolog 0-20 units TID with meals, Novolog 0-5 units HS  Inpatient Diabetes Program Recommendations Insulin - Basal: Please consider increasing Lantus to 70 units daily at 5pm (recommend changing time from bedtime to 5 pm since pt received Lantus yesterday at 5:15 pm). Insulin - Meal Coverage: Please consider ordering Novolog 5 units TID with meals for meal coverage (in addition to Novolog correction scale).  Thanks, Barnie Alderman, RN, MSN, CCRN, CDE Diabetes Coordinator Inpatient Diabetes Program [phone] (Team Pager from [place] to [place]) [phone] ([facility] office) [phone] [facility] office)

## 2014-10-17 ENCOUNTER — Emergency Department (HOSPITAL_COMMUNITY): Payer: Medicare Other

## 2014-10-17 ENCOUNTER — Observation Stay (HOSPITAL_COMMUNITY)
Admission: EM | Admit: 2014-10-17 | Discharge: 2014-10-17 | Disposition: A | Discharge status: Home / Self Care | Payer: Medicare Other | Attending: Internal Medicine | Admitting: Internal Medicine

## 2014-10-17 ENCOUNTER — Encounter (HOSPITAL_COMMUNITY): Payer: Self-pay | Admitting: *Deleted

## 2014-10-17 DIAGNOSIS — E118 Type 2 diabetes mellitus with unspecified complications: Secondary | ICD-10-CM

## 2014-10-17 DIAGNOSIS — Z87438 Personal history of other diseases of male genital organs: Secondary | ICD-10-CM | POA: Diagnosis not present

## 2014-10-17 DIAGNOSIS — L0390 Cellulitis, unspecified: Secondary | ICD-10-CM | POA: Diagnosis present

## 2014-10-17 DIAGNOSIS — L03211 Cellulitis of face: Principal | ICD-10-CM | POA: Insufficient documentation

## 2014-10-17 DIAGNOSIS — R739 Hyperglycemia, unspecified: Secondary | ICD-10-CM | POA: Diagnosis not present

## 2014-10-17 DIAGNOSIS — I10 Essential (primary) hypertension: Secondary | ICD-10-CM | POA: Diagnosis present

## 2014-10-17 DIAGNOSIS — Z794 Long term (current) use of insulin: Secondary | ICD-10-CM | POA: Insufficient documentation

## 2014-10-17 DIAGNOSIS — F419 Anxiety disorder, unspecified: Secondary | ICD-10-CM | POA: Diagnosis not present

## 2014-10-17 DIAGNOSIS — F329 Major depressive disorder, single episode, unspecified: Secondary | ICD-10-CM | POA: Diagnosis not present

## 2014-10-17 DIAGNOSIS — G8929 Other chronic pain: Secondary | ICD-10-CM | POA: Insufficient documentation

## 2014-10-17 DIAGNOSIS — L0201 Cutaneous abscess of face: Secondary | ICD-10-CM | POA: Diagnosis present

## 2014-10-17 DIAGNOSIS — E785 Hyperlipidemia, unspecified: Secondary | ICD-10-CM | POA: Diagnosis not present

## 2014-10-17 DIAGNOSIS — Z79899 Other long term (current) drug therapy: Secondary | ICD-10-CM | POA: Diagnosis not present

## 2014-10-17 DIAGNOSIS — K219 Gastro-esophageal reflux disease without esophagitis: Secondary | ICD-10-CM | POA: Diagnosis present

## 2014-10-17 DIAGNOSIS — R51 Headache: Secondary | ICD-10-CM | POA: Diagnosis not present

## 2014-10-17 DIAGNOSIS — E1165 Type 2 diabetes mellitus with hyperglycemia: Secondary | ICD-10-CM | POA: Diagnosis not present

## 2014-10-17 DIAGNOSIS — E119 Type 2 diabetes mellitus without complications: Secondary | ICD-10-CM

## 2014-10-17 HISTORY — DX: Cellulitis of face: L03.211

## 2014-10-17 LAB — COMPREHENSIVE METABOLIC PANEL
ALBUMIN: 3.7 g/dL (ref 3.5–5.2)
ALT: 11 U/L (ref 0–53)
AST: 13 U/L (ref 0–37)
Alkaline Phosphatase: 61 U/L (ref 39–117)
Anion gap: 7 (ref 5–15)
BUN: 12 mg/dL (ref 6–23)
CO2: 24 mmol/L (ref 19–32)
CREATININE: 0.7 mg/dL (ref 0.50–1.35)
Calcium: 8.6 mg/dL (ref 8.4–10.5)
Chloride: 105 mmol/L (ref 96–112)
GLUCOSE: 229 mg/dL — AB (ref 70–99)
Potassium: 3.6 mmol/L (ref 3.5–5.1)
Sodium: 136 mmol/L (ref 135–145)
Total Bilirubin: 0.3 mg/dL (ref 0.3–1.2)
Total Protein: 6.4 g/dL (ref 6.0–8.3)

## 2014-10-17 LAB — CBC WITH DIFFERENTIAL/PLATELET
BASOS ABS: 0 10*3/uL (ref 0.0–0.1)
Basophils Absolute: 0 10*3/uL (ref 0.0–0.1)
Basophils Relative: 1 % (ref 0–1)
Basophils Relative: 1 % (ref 0–1)
EOS PCT: 2 % (ref 0–5)
Eosinophils Absolute: 0.1 10*3/uL (ref 0.0–0.7)
Eosinophils Absolute: 0.1 10*3/uL (ref 0.0–0.7)
Eosinophils Relative: 2 % (ref 0–5)
HEMATOCRIT: 33.7 % — AB (ref 39.0–52.0)
HEMATOCRIT: 32.7 % — AB (ref 39.0–52.0)
HEMOGLOBIN: 11.2 g/dL — AB (ref 13.0–17.0)
Hemoglobin: 11.1 g/dL — ABNORMAL LOW (ref 13.0–17.0)
LYMPHS PCT: 41 % (ref 12–46)
Lymphocytes Relative: 37 % (ref 12–46)
Lymphs Abs: 1.7 10*3/uL (ref 0.7–4.0)
Lymphs Abs: 1.8 10*3/uL (ref 0.7–4.0)
MCH: 27.1 pg (ref 26.0–34.0)
MCH: 27.4 pg (ref 26.0–34.0)
MCHC: 33.2 g/dL (ref 30.0–36.0)
MCHC: 33.9 g/dL (ref 30.0–36.0)
MCV: 81.6 fL (ref 78.0–100.0)
MCV: 80.7 fL (ref 78.0–100.0)
MONO ABS: 0.2 10*3/uL (ref 0.1–1.0)
MONOS PCT: 5 % (ref 3–12)
Monocytes Absolute: 0.4 10*3/uL (ref 0.1–1.0)
Monocytes Relative: 9 % (ref 3–12)
NEUTROS ABS: 2.1 10*3/uL (ref 1.7–7.7)
Neutro Abs: 2.7 10*3/uL (ref 1.7–7.7)
Neutrophils Relative %: 55 % (ref 43–77)
Neutrophils Relative %: 47 % (ref 43–77)
Platelets: 282 10*3/uL (ref 150–400)
Platelets: 246 10*3/uL (ref 150–400)
RBC: 4.13 MIL/uL — AB (ref 4.22–5.81)
RBC: 4.05 MIL/uL — ABNORMAL LOW (ref 4.22–5.81)
RDW: 13.3 % (ref 11.5–15.5)
RDW: 13.3 % (ref 11.5–15.5)
WBC: 4.7 10*3/uL (ref 4.0–10.5)
WBC: 4.4 10*3/uL (ref 4.0–10.5)

## 2014-10-17 LAB — GLUCOSE, CAPILLARY
GLUCOSE-CAPILLARY: 129 mg/dL — AB (ref 70–99)
GLUCOSE-CAPILLARY: 220 mg/dL — AB (ref 70–99)

## 2014-10-17 LAB — BASIC METABOLIC PANEL
ANION GAP: 9 (ref 5–15)
BUN: 12 mg/dL (ref 6–23)
CALCIUM: 9.1 mg/dL (ref 8.4–10.5)
CHLORIDE: 100 mmol/L (ref 96–112)
CO2: 25 mmol/L (ref 19–32)
CREATININE: 0.95 mg/dL (ref 0.50–1.35)
GFR calc Af Amer: >90 mL/min (ref >90)
GFR calc non Af Amer: >90 mL/min (ref >90)
GLUCOSE: 471 mg/dL — AB (ref 70–99)
Potassium: 3.8 mmol/L (ref 3.5–5.1)
SODIUM: 134 mmol/L — AB (ref 135–145)

## 2014-10-17 LAB — CBG MONITORING, ED
GLUCOSE-CAPILLARY: 319 mg/dL — AB (ref 70–99)
GLUCOSE-CAPILLARY: 230 mg/dL — AB (ref 70–99)
Glucose-Capillary: 449 mg/dL — ABNORMAL HIGH (ref 70–99)
Glucose-Capillary: 393 mg/dL — ABNORMAL HIGH (ref 70–99)

## 2014-10-17 MED ORDER — AMLODIPINE BESYLATE 5 MG PO TABS
5.0000 mg | ORAL_TABLET | Freq: Every day | ORAL | Status: DC
  Administered 2014-10-17: 5 mg via ORAL
  Filled 2014-10-17: qty 1

## 2014-10-17 MED ORDER — ESCITALOPRAM OXALATE 20 MG PO TABS: 20.0000 mg | ORAL_TABLET | Freq: Every day | ORAL | Status: AC

## 2014-10-17 MED ORDER — HEPARIN SODIUM (PORCINE) 5000 UNIT/ML IJ SOLN
5000.0000 [IU] | Freq: Three times a day (TID) | INTRAMUSCULAR | Status: DC
  Administered 2014-10-17: 5000 [IU] via SUBCUTANEOUS

## 2014-10-17 MED ORDER — INSULIN GLARGINE 100 UNIT/ML ~~LOC~~ SOLN
60.0000 [IU] | Freq: Every day | SUBCUTANEOUS | Status: DC
  Administered 2014-10-17: 60 [IU] via SUBCUTANEOUS
  Filled 2014-10-17: qty 0.6

## 2014-10-17 MED ORDER — INSULIN ASPART 100 UNIT/ML ~~LOC~~ SOLN
0.0000 [IU] | SUBCUTANEOUS | Status: DC
  Administered 2014-10-17 (×2): 5 [IU] via SUBCUTANEOUS
  Administered 2014-10-17: 2 [IU] via SUBCUTANEOUS
  Filled 2014-10-17: qty 1

## 2014-10-17 MED ORDER — IOHEXOL 300 MG/ML  SOLN
80.0000 mL | Freq: Once | INTRAMUSCULAR | Status: AC | PRN
  Administered 2014-10-17: 80 mL via INTRAVENOUS

## 2014-10-17 MED ORDER — INSULIN GLARGINE 100 UNIT/ML ~~LOC~~ SOLN: 60.0000 [IU] | Freq: Every day | SUBCUTANEOUS | Status: DC

## 2014-10-17 MED ORDER — OXYCODONE HCL 5 MG PO TABS
15.0000 mg | ORAL_TABLET | ORAL | Status: DC | PRN
  Administered 2014-10-17 (×3): 15 mg via ORAL
  Filled 2014-10-17 (×3): qty 3

## 2014-10-17 MED ORDER — DOXYCYCLINE HYCLATE 100 MG PO TABS: 100.0000 mg | ORAL_TABLET | Freq: Two times a day (BID) | ORAL | Status: DC

## 2014-10-17 MED ORDER — PANTOPRAZOLE SODIUM 40 MG PO TBEC
80.0000 mg | DELAYED_RELEASE_TABLET | Freq: Every day | ORAL | Status: DC
  Administered 2014-10-17: 80 mg via ORAL
  Filled 2014-10-17: qty 2

## 2014-10-17 MED ORDER — ESCITALOPRAM OXALATE 10 MG PO TABS
20.0000 mg | ORAL_TABLET | Freq: Every day | ORAL | Status: DC
  Administered 2014-10-17: 20 mg via ORAL
  Filled 2014-10-17: qty 2
  Filled 2014-10-17: qty 1

## 2014-10-17 MED ORDER — INSULIN GLARGINE 100 UNIT/ML ~~LOC~~ SOLN: 70.0000 [IU] | Freq: Every day | SUBCUTANEOUS | Status: DC

## 2014-10-17 MED ORDER — SODIUM CHLORIDE 0.9 % IV SOLN
1000.0000 mL | Freq: Once | INTRAVENOUS | Status: AC
  Administered 2014-10-17: 1000 mL via INTRAVENOUS

## 2014-10-17 MED ORDER — FENTANYL CITRATE (PF) 100 MCG/2ML IJ SOLN
50.0000 ug | Freq: Once | INTRAMUSCULAR | Status: AC
  Administered 2014-10-17: 50 ug via INTRAVENOUS
  Filled 2014-10-17: qty 2

## 2014-10-17 MED ORDER — SODIUM CHLORIDE 0.9 % IV SOLN
1000.0000 mL | INTRAVENOUS | Status: DC
  Administered 2014-10-17: 1000 mL via INTRAVENOUS

## 2014-10-17 MED ORDER — INSULIN GLARGINE 100 UNIT/ML ~~LOC~~ SOLN: 60.0000 [IU] | Freq: Every day | SUBCUTANEOUS | Status: AC

## 2014-10-17 MED ORDER — DOXYCYCLINE HYCLATE 100 MG PO TABS
100.0000 mg | ORAL_TABLET | Freq: Two times a day (BID) | ORAL | Status: DC
  Administered 2014-10-17: 100 mg via ORAL
  Filled 2014-10-17: qty 1

## 2014-10-17 MED ORDER — ALPRAZOLAM 1 MG PO TABS
2.0000 mg | ORAL_TABLET | Freq: Every evening | ORAL | Status: DC | PRN
Start: 2014-10-17 — End: 2014-10-17

## 2014-10-17 MED ORDER — AMLODIPINE BESYLATE 10 MG PO TABS: 5.0000 mg | ORAL_TABLET | Freq: Every day | ORAL | Status: AC

## 2014-10-17 MED ORDER — DOXYCYCLINE HYCLATE 100 MG PO TABS: 100.0000 mg | ORAL_TABLET | Freq: Two times a day (BID) | ORAL | Status: AC

## 2014-10-17 MED ORDER — CYCLOBENZAPRINE HCL 10 MG PO TABS: 5.0000 mg | ORAL_TABLET | Freq: Three times a day (TID) | ORAL | Status: DC | PRN

## 2014-10-17 MED ORDER — OXYCODONE HCL 15 MG PO TABS: 15.0000 mg | ORAL_TABLET | ORAL | Status: AC | PRN

## 2014-10-17 MED ORDER — ALPRAZOLAM 2 MG PO TABS: 2.0000 mg | ORAL_TABLET | Freq: Every evening | ORAL | Status: AC | PRN

## 2014-10-17 MED ORDER — SODIUM CHLORIDE 0.9 % IV SOLN
INTRAVENOUS | Status: DC
  Administered 2014-10-17: 3.3 [IU]/h via INTRAVENOUS
  Filled 2014-10-17: qty 2.5

## 2014-10-17 MED ORDER — FENTANYL CITRATE (PF) 100 MCG/2ML IJ SOLN
INTRAMUSCULAR | Status: AC
  Administered 2014-10-17: 50 ug
  Filled 2014-10-17: qty 2

## 2014-10-17 MED ORDER — VANCOMYCIN HCL IN DEXTROSE 1-5 GM/200ML-% IV SOLN
1000.0000 mg | Freq: Once | INTRAVENOUS | Status: AC
  Administered 2014-10-17: 1000 mg via INTRAVENOUS
  Filled 2014-10-17: qty 200

## 2014-10-17 MED ORDER — LISINOPRIL 10 MG PO TABS
20.0000 mg | ORAL_TABLET | Freq: Every day | ORAL | Status: DC
  Administered 2014-10-17: 20 mg via ORAL
  Filled 2014-10-17: qty 2

## 2014-10-17 MED ORDER — CYCLOBENZAPRINE HCL 5 MG PO TABS: 5.0000 mg | ORAL_TABLET | Freq: Three times a day (TID) | ORAL | Status: AC | PRN

## 2014-10-17 MED ORDER — LISINOPRIL 20 MG PO TABS: 20.0000 mg | ORAL_TABLET | Freq: Every day | ORAL | Status: AC

## 2014-10-17 NOTE — Progress Notes (Signed)
Inpatient Diabetes Program Recommendations  AACE/ADA: New Consensus Statement on Inpatient Glycemic Control (2013)  Target Ranges:  Prepandial:   less than 140 mg/dL      Peak postprandial:   less than 180 mg/dL (1-2 hours)      Critically ill patients:  140 - 180 mg/dL   Results for TOUSSAINT, GOLSON (MRN [id number]) as of 10/17/2014 08:08  Ref. Range 10/17/2014 01:20 10/17/2014 02:35 10/17/2014 03:39 10/17/2014 04:46  Glucose-Capillary Latest Ref Range: 70-99 mg/dL 449 (H) 393 (H) 319 (H) 230 (H)   Diabetes history: DM2 Outpatient Diabetes medications: Lantus 70 units daily with supper, Novolog 5 units TID with meals Current orders for Inpatient glycemic control: Lantus 60 units daily with supper, Novolog 0-15 units Q4H  Inpatient Diabetes Program Recommendations Insulin - Basal: Initial glucose of 449 mg/dl and was started on an insulin drip and is now ordered SQ insulin. NO basal insulin was given at time of transition from IV insulin to SQ insulin. Ordered Lantus 60 units daily with supper; please consider changing frequency to Q24H starting now so patient will receive Lantus this morning.  Thanks, Barnie Alderman, RN, MSN, CCRN, CDE Diabetes Coordinator Inpatient Diabetes Program [phone] (Team Pager from [place] to [place]) [phone] ([facility] office) [phone] [facility] office)

## 2014-10-17 NOTE — Progress Notes (Signed)
UR completed 

## 2014-10-17 NOTE — Care Management Note (Signed)
    Page 1 of 1   10/17/2014     11:59:50 AM CARE MANAGEMENT NOTE 10/17/2014  Patient:  Christopher Raymond, Christopher Raymond   Account Number:  [id number]  Date Initiated:  10/17/2014  Documentation initiated by:  Jolene Provost  Subjective/Objective Assessment:   Pt is from home, independent at baseline. Pt has no CM needs.     Action/Plan:   Anticipated DC Date:  10/17/2014   Anticipated DC Plan:  [place]  CM consult      Choice offered to / List presented to:             Status of service:  Completed, signed off Medicare Important Message given?   (If response is "NO", the following Medicare IM given date fields will be blank) Date Medicare IM given:   Medicare IM given by:   Date Additional Medicare IM given:   Additional Medicare IM given by:    Discharge Disposition:  HOME/SELF CARE  Per UR Regulation:    If discussed at Long Length of Stay Meetings, dates discussed:    Comments:  10/17/2014 [place], RN, MSN, CM

## 2014-10-17 NOTE — Discharge Instructions (Signed)

## 2014-10-17 NOTE — H&P (Addendum)
Hospitalist Admission History and Physical  Patient name: Christopher Raymond Medical record number: [id number] Date of birth: 1976/04/06 Age: 39 y.o. Gender: male  Primary Care Provider: Jacqualine Code, DO  Chief Complaint: facial cellulitis, hyperglycemia  History of Present Illness:This is a 39 y.o. year old male with significant past medical history of HTN, poorly controlled IDDM,  GERD, chronic pain, recurrent cellulitis presenting with cellulitis. Pt reports R cheek pain and swelling over past 2 days. Minimal to mild chills at home. Spontaneously drained clear fluid yesterday. No eye pain. No LOV. Had similar episode on contralateral cheek 1-2 months ago.  Presented to the ER afebrile, hemodynamically stable. WBC 4.7, Cr 0.95. CBG 471. Maxillofacial CT done shows focal skin thickening on  R cheek w/ abscess formation.   Assessment and Plan: Christopher Raymond is a 39 y.o. year old male presenting with facial cellulitis, hyperglycemia   Active Problems:   Cellulitis   1- Cellulitis -no abscess formation on imaging -s/p IV vanc in ER -oral doxy per cellulitis order set-mild disease -blood cx -follow  -anticipate early d/c  2-Hyperglycemia -poorly controlled at baseline -no acidosis  -check UA -last A1C 08/2014 @ 15.7 -suspect noncompliance -s/p insulin gtt in ER -sugars now in 200s  -transition to SSI -cont lantus -follow  3- HTN -BP stable  -cont home regimen   4- chronic pain  -stable  -cont home regimen  5-psych  -mood stable  -cont home regimen   FEN/GI: heart healthy-carb modified diet. PPI Prophylaxis: sub q heparin  Disposition: pending further evaluation Code Status: Full Code    Patient Active Problem List   Diagnosis Date Noted  . Essential hypertension   . Esophageal reflux   . Type 2 diabetes mellitus with complication   . Bilateral low back pain without sciatica   . Pilonidal cyst 02/28/2014  . Osteomyelitis 12/03/2013  . Acute  osteomyelitis of foot 10/06/2013  . Diabetic osteomyelitis 10/05/2013  . Diabetic foot ulcer 09/29/2013  . Hyponatremia 09/29/2013  . Hyperglycemia 09/29/2013  . Non-healing ulcer 06/26/2013  . Diabetic foot infection 06/20/2013  . Essential hypertension, benign 09/18/2012  . Diabetes 09/18/2012  . Chronic low back pain 09/18/2012  . Cellulitis 09/18/2012  . Rectal bleed 01/22/2011  . GERD (gastroesophageal reflux disease) 01/22/2011  . Vomiting 01/22/2011   Past Medical History: Past Medical History  Diagnosis Date  . GERD (gastroesophageal reflux disease)   . Diabetes mellitus   . HTN (hypertension)   . Depression   . Anxiety   . Chronic pain   . ED (erectile dysfunction)   . Hyperlipidemia   . Diabetic foot ulcer     Past Surgical History: Past Surgical History  Procedure Laterality Date  . Knee surgery    . Back surgery    . Cholecystectomy  2009  . Tonsillectomy    . Cholecystectomy open    . Wound debridement Left 10/05/2013    Procedure: INCISION AND DEBRIDEMENT LEFT FOOT;  Surgeon: Jamesetta So, MD;  Location: [facility];  Service: General;  Laterality: Left;    Social History: History   Social History  . Marital Status: Legally Separated    Spouse Name: N/A  . Number of Children: 0  . Years of Education: N/A   Occupational History  . disabled     previously warehouse work   Social History Main Topics  . Smoking status: Never Smoker   . Smokeless tobacco: Current User    Types: Snuff  Comment: dip x 20  . Alcohol Use: Yes     Comment: very little 4-5 beers/yr  . Drug Use: No  . Sexual Activity: Yes   Other Topics Concern  . None   Social History Narrative    Family History: Family History  Problem Relation Age of Onset  . Coronary artery disease Father   . Heart attack Father   . Hypertension Mother     Allergies: Allergies  Allergen Reactions  . Levaquin [Levofloxacin] Rash    Current Facility-Administered Medications   Medication Dose Route Frequency Provider Last Rate Last Dose  . 0.9 %  sodium chloride infusion  1,000 mL Intravenous Continuous Rolland Porter, MD 125 mL/hr at 10/17/14 0247 1,000 mL at 10/17/14 0247  . ALPRAZolam Duanne Moron) tablet 2 mg  2 mg Oral QHS PRN Deneise Lever, MD      . amLODipine (NORVASC) tablet 5 mg  5 mg Oral Daily Deneise Lever, MD      . cyclobenzaprine (FLEXERIL) tablet 5 mg  5 mg Oral TID PRN Deneise Lever, MD      . doxycycline (VIBRA-TABS) tablet 100 mg  100 mg Oral Q12H Deneise Lever, MD      . escitalopram (LEXAPRO) tablet 20 mg  20 mg Oral Daily Deneise Lever, MD      . heparin injection 5,000 Units  5,000 Units Subcutaneous 3 times per day Deneise Lever, MD      . insulin aspart (novoLOG) injection 0-15 Units  0-15 Units Subcutaneous 6 times per day Deneise Lever, MD      . Insulin Glargine (LANTUS) Solostar Pen 70 Units  70 Units Subcutaneous Daily Deneise Lever, MD      . lisinopril (PRINIVIL,ZESTRIL) tablet 20 mg  20 mg Oral Daily Deneise Lever, MD      . oxyCODONE (Oxy IR/ROXICODONE) immediate release tablet 15 mg  15 mg Oral Q4H PRN Deneise Lever, MD      . pantoprazole (PROTONIX) EC tablet 80 mg  80 mg Oral Q1200 Deneise Lever, MD       Current Outpatient Prescriptions  Medication Sig Dispense Refill  . alprazolam (XANAX) 2 MG tablet Take 2 mg by mouth at bedtime as needed for sleep.    Marland Kitchen amLODipine (NORVASC) 10 MG tablet Take 0.5 tablets (5 mg total) by mouth daily. 31 tablet 0  . cyclobenzaprine (FLEXERIL) 5 MG tablet Take 1 tablet (5 mg total) by mouth 3 (three) times daily as needed for muscle spasms. 30 tablet 0  . escitalopram (LEXAPRO) 20 MG tablet Take 1 tablet by mouth daily.    Marland Kitchen esomeprazole (NEXIUM) 40 MG capsule Take 1 capsule (40 mg total) by mouth daily before breakfast. 30 capsule 0  . insulin aspart (NOVOLOG) 100 UNIT/ML FlexPen Inject 5 Units into the skin 3 (three) times daily with meals. 15 mL 11  . Insulin Glargine (LANTUS) 100  UNIT/ML Solostar Pen Inject 70 Units into the skin daily. At 5pm 15 mL 11  . lisinopril (PRINIVIL,ZESTRIL) 20 MG tablet Take 20 mg by mouth daily.    Marland Kitchen oxyCODONE (ROXICODONE) 15 MG immediate release tablet Take 1 tablet by mouth every 4 (four) hours as needed for pain.   0  . acetaminophen (TYLENOL) 500 MG tablet Take 1,000 mg by mouth every 6 (six) hours as needed for headache.     Review Of Systems: 12 point ROS negative except as noted above in HPI.  Physical Exam:  Filed Vitals:   10/17/14 0324  BP: 137/74  Pulse: 81  Temp:   Resp: 18    General: alert and cooperative HEENT: PERRLA, extra ocular movement intact and + mild R cheek redness, small central crusting  Heart: S1, S2 normal, no murmur, rub or gallop, regular rate and rhythm Lungs: clear to auscultation, no wheezes or rales and unlabored breathing Abdomen: abdomen is soft without significant tenderness, masses, organomegaly or guarding Extremities: extremities normal, atraumatic, no cyanosis or edema Skin:as above Neurology: normal without focal findings  Labs and Imaging: Lab Results  Component Value Date/Time   NA 134* 10/17/2014 02:04 AM   K 3.8 10/17/2014 02:04 AM   CL 100 10/17/2014 02:04 AM   CO2 25 10/17/2014 02:04 AM   BUN 12 10/17/2014 02:04 AM   CREATININE 0.95 10/17/2014 02:04 AM   GLUCOSE 471* 10/17/2014 02:04 AM   Lab Results  Component Value Date   WBC 4.7 10/17/2014   HGB 11.2* 10/17/2014   HCT 33.7* 10/17/2014   MCV 81.6 10/17/2014   PLT 282 10/17/2014      Ct Maxillofacial W/cm  10/17/2014   CLINICAL DATA:  39 year old male with right facial abscess  EXAM: CT MAXILLOFACIAL WITH CONTRAST  TECHNIQUE: Multidetector CT imaging of the maxillofacial structures was performed with intravenous contrast. Multiplanar CT image reconstructions were also generated. A small metallic BB was placed on the right temple in order to reliably differentiate right from left.  CONTRAST:  11mL OMNIPAQUE IOHEXOL 300  MG/ML  SOLN  COMPARISON:  CT scan of the face 07/12/2012  FINDINGS: The visualized intracranial contents are unremarkable. The globes and orbits are symmetric and within normal limits bilaterally. Focal skin thickening of the right cheek overlying the inferior aspect of the maxillary antrum. There is mild interstitial stranding in the underlying subcutaneous fat but no evidence of soft tissue abscess.  No suspicious adenopathy. The remainder the visualized soft tissues are unremarkable. Normal aeration of the mastoid air cells and paranasal sinuses. No evidence of odontogenic source for the soft tissue cellulitis.  IMPRESSION: Focal skin thickening of the right cheek with mild inflammatory stranding in the subjacent superficial fat but no evidence of focal fluid collection or abscess. Imaging findings are most consistent with cellulitis.  No other acute abnormality.   Electronically Signed   By: Jacqulynn Cadet M.D.   On: 10/17/2014 03:49           Shanda Howells MD  Pager: [phone]

## 2014-10-17 NOTE — Discharge Summary (Signed)
Physician Discharge Summary  Christopher Raymond OVF:[id number] DOB: 05/10/1976 DOA: 10/17/2014  PCP: Jacqualine Code, DO  Admit date: 10/17/2014 Discharge date: 10/17/2014  Time spent: 40 minutes  Recommendations for Outpatient Follow-up:  1. PCP in [place] follow up in 1-2 weeks for evaluation of cellulitis of face and DM control.   Discharge Diagnoses:  Principal Problem:   Cellulitis Active Problems:   GERD (gastroesophageal reflux disease)   Essential hypertension, benign   Diabetes   Discharge Condition: stable  Diet recommendation: carb modified   Filed Weights   10/17/14 0114 10/17/14 0605  Weight: 111.131 kg (245 lb) 116.529 kg (256 lb 14.4 oz)    History of present illness:  This is a 39 y.o. year old male with significant past medical history of HTN, poorly controlled IDDM, GERD, chronic pain, recurrent cellulitis presented 10/17/14 with cellulitis. Pt reported R cheek pain and swelling over previous 2 days. Minimal to mild chills at home. Spontaneously drained clear fluid 1 day prior. No eye pain. No LOV. Had similar episode on contralateral cheek 1-2 months prior.  Presented to the ER afebrile, hemodynamically stable. WBC 4.7, Cr 0.95. CBG 471. Maxillofacial CT done showed focal skin thickening on R cheek w/out abscess formation.   Hospital Course:  1- Cellulitis:-no abscess formation on imaging. Received one dose  IV vanc in ER and provided with doxy for mild cellulitis. Remained afebrile and non-toxic appearing. Blood cultures with no growth todate.  No leukocytosis. Will discharge with 7 days doxy. Will need to follow up with PCP in [place] in 1-2 weeks. Instructed to return if worsens or fails to continue to improve.   2-Hyperglycemia: poorly controlled at baseline. A1c 08/2014 15.7.   Await current A1c. No acidosis. S/p insulin gtt in ER. CBG range 129-230. Will discharge with  Home lantus at slightly lower dose. Continue home regimen at discharge. Discussed  importance of compliance with DM regime. Recommended close follow up with PCP for optimal control.   3- HTN: remained stable.    4- chronic pain: stable at baseline.   5-psych: stable at baseline    Procedures:  none  Consultations:  none  Discharge Exam: Filed Vitals:   10/17/14 0605  BP: 152/76  Pulse: 78  Temp: 98.4 F (36.9 C)  Resp: 16    General: well nourished appears comfortable Cardiovascular: RRR no MGR No LE edema Respiratory: normal effort BS clear bilaterally no wheeze Skin: right cheek with mild erythema and edema, dime size crusty area in center. Mild heat and tenderness to palpation. Some induration.   Discharge Instructions   Discharge Instructions    Diet - low sodium heart healthy    Complete by:  As directed      Discharge instructions    Complete by:  As directed   Follow up with PCP in 1-2 weeks for evaluation of cellulitis and DM control     Increase activity slowly    Complete by:  As directed           Current Discharge Medication List    START taking these medications   Details  doxycycline (VIBRA-TABS) 100 MG tablet Take 1 tablet (100 mg total) by mouth every 12 (twelve) hours. Qty: 14 tablet, Refills: 0    insulin glargine (LANTUS) 100 UNIT/ML injection Inject 0.6 mLs (60 Units total) into the skin daily before supper. Qty: 10 mL, Refills: 11      CONTINUE these medications which have CHANGED   Details  alprazolam (XANAX) 2 MG  tablet Take 1 tablet (2 mg total) by mouth at bedtime as needed for sleep. Qty: 30 tablet, Refills: 0    amLODipine (NORVASC) 10 MG tablet Take 0.5 tablets (5 mg total) by mouth daily. Qty: 31 tablet, Refills: 0    cyclobenzaprine (FLEXERIL) 5 MG tablet Take 1 tablet (5 mg total) by mouth 3 (three) times daily as needed for muscle spasms. Qty: 30 tablet, Refills: 0    escitalopram (LEXAPRO) 20 MG tablet Take 1 tablet (20 mg total) by mouth daily.    lisinopril (PRINIVIL,ZESTRIL) 20 MG tablet Take  1 tablet (20 mg total) by mouth daily.    oxyCODONE (ROXICODONE) 15 MG immediate release tablet Take 1 tablet (15 mg total) by mouth every 4 (four) hours as needed for pain.      CONTINUE these medications which have NOT CHANGED   Details  acetaminophen (TYLENOL) 500 MG tablet Take 1,000 mg by mouth every 6 (six) hours as needed for headache.    esomeprazole (NEXIUM) 40 MG capsule Take 1 capsule (40 mg total) by mouth daily before breakfast. Qty: 30 capsule, Refills: 0    insulin aspart (NOVOLOG) 100 UNIT/ML FlexPen Inject 5 Units into the skin 3 (three) times daily with meals. Qty: 15 mL, Refills: 11      STOP taking these medications     Insulin Glargine (LANTUS) 100 UNIT/ML Solostar Pen        Allergies  Allergen Reactions  . Levaquin [Levofloxacin] Rash      The results of significant diagnostics from this hospitalization (including imaging, microbiology, ancillary and laboratory) are listed below for reference.    Significant Diagnostic Studies: Ct Maxillofacial W/cm  10/17/2014   CLINICAL DATA:  39 year old male with right facial abscess  EXAM: CT MAXILLOFACIAL WITH CONTRAST  TECHNIQUE: Multidetector CT imaging of the maxillofacial structures was performed with intravenous contrast. Multiplanar CT image reconstructions were also generated. A small metallic BB was placed on the right temple in order to reliably differentiate right from left.  CONTRAST:  74mL OMNIPAQUE IOHEXOL 300 MG/ML  SOLN  COMPARISON:  CT scan of the face 07/12/2012  FINDINGS: The visualized intracranial contents are unremarkable. The globes and orbits are symmetric and within normal limits bilaterally. Focal skin thickening of the right cheek overlying the inferior aspect of the maxillary antrum. There is mild interstitial stranding in the underlying subcutaneous fat but no evidence of soft tissue abscess.  No suspicious adenopathy. The remainder the visualized soft tissues are unremarkable. Normal aeration  of the mastoid air cells and paranasal sinuses. No evidence of odontogenic source for the soft tissue cellulitis.  IMPRESSION: Focal skin thickening of the right cheek with mild inflammatory stranding in the subjacent superficial fat but no evidence of focal fluid collection or abscess. Imaging findings are most consistent with cellulitis.  No other acute abnormality.   Electronically Signed   By: Jacqulynn Cadet M.D.   On: 10/17/2014 03:49    Microbiology: Recent Results (from the past 240 hour(s))  Culture, blood (routine x 2)     Status: None (Preliminary result)   Collection Time: 10/17/14  5:17 AM  Result Value Ref Range Status   Specimen Description BLOOD RIGHT HAND  Final   Special Requests BOTTLES DRAWN AEROBIC AND ANAEROBIC 6CC  Final   Culture NO GROWTH <24 HRS  Final   Report Status PENDING  Incomplete  Culture, blood (routine x 2)     Status: None (Preliminary result)   Collection Time: 10/17/14  5:25 AM  Result Value Ref Range Status   Specimen Description BLOOD  Final   Special Requests NONE  Final   Culture NO GROWTH <24 HRS  Final   Report Status PENDING  Incomplete     Labs: Basic Metabolic Panel:  Recent Labs Lab 10/17/14 0204 10/17/14 0517  NA 134* 136  K 3.8 3.6  CL 100 105  CO2 25 24  GLUCOSE 471* 229*  BUN 12 12  CREATININE 0.95 0.70  CALCIUM 9.1 8.6   Liver Function Tests:  Recent Labs Lab 10/17/14 0517  AST 13  ALT 11  ALKPHOS 61  BILITOT 0.3  PROT 6.4  ALBUMIN 3.7   No results for input(s): LIPASE, AMYLASE in the last 168 hours. No results for input(s): AMMONIA in the last 168 hours. CBC:  Recent Labs Lab 10/17/14 0204 10/17/14 0517  WBC 4.7 4.4  NEUTROABS 2.7 2.1  HGB 11.2* 11.1*  HCT 33.7* 32.7*  MCV 81.6 80.7  PLT 282 246   Cardiac Enzymes: No results for input(s): CKTOTAL, CKMB, CKMBINDEX, TROPONINI in the last 168 hours. BNP: BNP (last 3 results) No results for input(s): BNP in the last 8760 hours.  ProBNP (last 3  results) No results for input(s): PROBNP in the last 8760 hours.  CBG:  Recent Labs Lab 10/17/14 0120 10/17/14 0235 10/17/14 0339 10/17/14 0446 10/17/14 0734  GLUCAP 449* 393* 319* 230* 129*       Signed:  BLACK,KAREN M  [facility] 10/17/2014, 10:51 AM

## 2014-10-17 NOTE — ED Provider Notes (Signed)
CSN: [id number]     Arrival date & time 10/17/14  0057 History   First MD Initiated Contact with Patient 10/17/14 0101     Chief Complaint  Patient presents with  . Abscess    boil to right cheek since yesterday morning . patient stated he had clear drainage from the boil this morning  . Headache     (Consider location/radiation/quality/duration/timing/severity/associated sxs/prior Treatment) HPI  Patient reports a long history of boils or abscesses and states he was actually diagnosed with diabetes in 2004 when he presented with an abscess. He reports about 2 months ago he had an abscess on his left cheek area of his face. He reports yesterday started getting redness with pain and swelling of his right cheek. He has not had any fever however he did have nausea and vomiting twice today. He has not had diarrhea. He states that today the area draining some clear fluid however did not make his symptoms improved. This morning when he woke up he felt like his right eye was "floating in space". He states he has some minor discomfort when he moves his eye. He reports whenever he has the abscesses his blood sugars get out of control.  PCP Dr Derrek Monaco in [place], [place]  Past Medical History  Diagnosis Date  . GERD (gastroesophageal reflux disease)   . Diabetes mellitus   . HTN (hypertension)   . Depression   . Anxiety   . Chronic pain   . ED (erectile dysfunction)   . Hyperlipidemia   . Diabetic foot ulcer    Past Surgical History  Procedure Laterality Date  . Knee surgery    . Back surgery    . Cholecystectomy  2009  . Tonsillectomy    . Cholecystectomy open    . Wound debridement Left 10/05/2013    Procedure: INCISION AND DEBRIDEMENT LEFT FOOT;  Surgeon: Jamesetta So, MD;  Location: [facility];  Service: General;  Laterality: Left;   Family History  Problem Relation Age of Onset  . Coronary artery disease Father   . Heart attack Father   . Hypertension Mother    History    Substance Use Topics  . Smoking status: Never Smoker   . Smokeless tobacco: Current User    Types: Snuff     Comment: dip x 20  . Alcohol Use: Yes     Comment: very little 4-5 beers/yr  on disability for fracture of back and diabetes  Review of Systems  All other systems reviewed and are negative.     Allergies  Levaquin  Home Medications   Prior to Admission medications   Medication Sig Start Date End Date Taking? Authorizing Provider  alprazolam Duanne Moron) 2 MG tablet Take 2 mg by mouth at bedtime as needed for sleep.   Yes Historical Provider, MD  amLODipine (NORVASC) 10 MG tablet Take 0.5 tablets (5 mg total) by mouth daily. 12/05/13  Yes Nita Sells, MD  cyclobenzaprine (FLEXERIL) 5 MG tablet Take 1 tablet (5 mg total) by mouth 3 (three) times daily as needed for muscle spasms. 09/15/14  Yes Kathie Dike, MD  escitalopram (LEXAPRO) 20 MG tablet Take 1 tablet by mouth daily. 08/05/14  Yes Historical Provider, MD  esomeprazole (NEXIUM) 40 MG capsule Take 1 capsule (40 mg total) by mouth daily before breakfast. 03/30/13  Yes Julianne Rice, MD  insulin aspart (NOVOLOG) 100 UNIT/ML FlexPen Inject 5 Units into the skin 3 (three) times daily with meals. 09/15/14  Yes Kathie Dike, MD  Insulin Glargine (LANTUS) 100 UNIT/ML Solostar Pen Inject 70 Units into the skin daily. At 5pm 09/15/14  Yes Kathie Dike, MD  lisinopril (PRINIVIL,ZESTRIL) 20 MG tablet Take 20 mg by mouth daily.   Yes Historical Provider, MD  oxyCODONE (ROXICODONE) 15 MG immediate release tablet Take 1 tablet by mouth every 4 (four) hours as needed for pain.  09/02/14  Yes Historical Provider, MD  acetaminophen (TYLENOL) 500 MG tablet Take 1,000 mg by mouth every 6 (six) hours as needed for headache.    Historical Provider, MD   BP 156/92 mmHg  Pulse 100  Temp(Src) 98.7 F (37.1 C) (Oral)  Resp 20  Ht 6\' 1"  (1.854 m)  Wt 245 lb (111.131 kg)  BMI 32.33 kg/m2  SpO2 100%  Vital signs normal except  hypertension and borderline tachycardia  Physical Exam  Constitutional: He is oriented to person, place, and time. He appears well-developed and well-nourished.  Non-toxic appearance. He does not appear ill. No distress.  HENT:  Head: Normocephalic.  Right Ear: External ear normal.  Left Ear: External ear normal.  Nose: Nose normal. No mucosal edema or rhinorrhea.  Mouth/Throat: Oropharynx is clear and moist and mucous membranes are normal. No dental abscesses or uvula swelling.  Patient has a small area on his right lower cheek that has some dried serosanguineous material on it where he states it had drained earlier today. He has some increased redness and swelling around that with a small amount of induration palpated in the subcutaneous tissue about a half centimeter in size. He is also very tender just adjacent to his right nose and underneath his right eye. He has mild discomfort on range of motion of his right eye.  Eyes: Conjunctivae and EOM are normal. Pupils are equal, round, and reactive to light.  Neck: Normal range of motion and full passive range of motion without pain. Neck supple.  Pulmonary/Chest: Effort normal. No respiratory distress. He has no wheezes. He has no rhonchi. He exhibits no crepitus.  Abdominal: Normal appearance.  Musculoskeletal: Normal range of motion. He exhibits no edema or tenderness.  Moves all extremities well.   Neurological: He is alert and oriented to person, place, and time. He has normal strength. No cranial nerve deficit.  Skin: Skin is warm, dry and intact. No rash noted. No erythema. No pallor.  Psychiatric: He has a normal mood and affect. His speech is normal and behavior is normal. His mood appears not anxious.  Nursing note and vitals reviewed.      ED Course  Procedures (including critical care time)  Medications  0.9 %  sodium chloride infusion (0 mLs Intravenous Stopped 10/17/14 0247)    Followed by  0.9 %  sodium chloride infusion  (1,000 mLs Intravenous New Bag/Given 10/17/14 0247)  fentaNYL (SUBLIMAZE) injection 50 mcg (50 mcg Intravenous Given 10/17/14 0211)  vancomycin (VANCOCIN) IVPB 1000 mg/200 mL premix (0 mg Intravenous Stopped 10/17/14 0320)  iohexol (OMNIPAQUE) 300 MG/ML solution 80 mL (80 mLs Intravenous Contrast Given 10/17/14 0303)  fentaNYL (SUBLIMAZE) 100 MCG/2ML injection (50 mcg  Given 10/17/14 0324)    Patient was started on IV fluids and an insulin drip to control his hyperglycemia. He was given fentanyl for pain. He was started on vancomycin for suspected MRSA type infection.  05:53 Pt given his CT results. Discussed admission for more IV antibiotics and control of his CBG.   04:11 Dr Ernestina Patches, will come see patient.  Labs Review Results for orders placed or performed during the  hospital encounter of 64/68/03  Basic metabolic panel  Result Value Ref Range   Sodium 134 (L) 135 - 145 mmol/L   Potassium 3.8 3.5 - 5.1 mmol/L   Chloride 100 96 - 112 mmol/L   CO2 25 19 - 32 mmol/L   Glucose, Bld 471 (H) 70 - 99 mg/dL   BUN 12 6 - 23 mg/dL   Creatinine, Ser 0.95 0.50 - 1.35 mg/dL   Calcium 9.1 8.4 - 10.5 mg/dL   GFR calc non Af Amer >90 >90 mL/min   GFR calc Af Amer >90 >90 mL/min   Anion gap 9 5 - 15  CBC with Differential  Result Value Ref Range   WBC 4.7 4.0 - 10.5 K/uL   RBC 4.13 (L) 4.22 - 5.81 MIL/uL   Hemoglobin 11.2 (L) 13.0 - 17.0 g/dL   HCT 33.7 (L) 39.0 - 52.0 %   MCV 81.6 78.0 - 100.0 fL   MCH 27.1 26.0 - 34.0 pg   MCHC 33.2 30.0 - 36.0 g/dL   RDW 13.3 11.5 - 15.5 %   Platelets 282 150 - 400 K/uL   Neutrophils Relative % 55 43 - 77 %   Neutro Abs 2.7 1.7 - 7.7 K/uL   Lymphocytes Relative 37 12 - 46 %   Lymphs Abs 1.7 0.7 - 4.0 K/uL   Monocytes Relative 5 3 - 12 %   Monocytes Absolute 0.2 0.1 - 1.0 K/uL   Eosinophils Relative 2 0 - 5 %   Eosinophils Absolute 0.1 0.0 - 0.7 K/uL   Basophils Relative 1 0 - 1 %   Basophils Absolute 0.0 0.0 - 0.1 K/uL  CBG monitoring, ED  Result  Value Ref Range   Glucose-Capillary 449 (H) 70 - 99 mg/dL  CBG monitoring, ED  Result Value Ref Range   Glucose-Capillary 393 (H) 70 - 99 mg/dL  CBG monitoring, ED  Result Value Ref Range   Glucose-Capillary 319 (H) 70 - 99 mg/dL  CBG monitoring, ED  Result Value Ref Range   Glucose-Capillary 230 (H) 70 - 99 mg/dL   Laboratory interpretation all normal except improving hyperglycemia without an anion gap, mild anemia     Imaging Review Ct Maxillofacial W/cm  10/17/2014   CLINICAL DATA:  39 year old male with right facial abscess  EXAM: CT MAXILLOFACIAL WITH CONTRAST  TECHNIQUE: Multidetector CT imaging of the maxillofacial structures was performed with intravenous contrast. Multiplanar CT image reconstructions were also generated. A small metallic BB was placed on the right temple in order to reliably differentiate right from left.  CONTRAST:  73mL OMNIPAQUE IOHEXOL 300 MG/ML  SOLN  COMPARISON:  CT scan of the face 07/12/2012  FINDINGS: The visualized intracranial contents are unremarkable. The globes and orbits are symmetric and within normal limits bilaterally. Focal skin thickening of the right cheek overlying the inferior aspect of the maxillary antrum. There is mild interstitial stranding in the underlying subcutaneous fat but no evidence of soft tissue abscess.  No suspicious adenopathy. The remainder the visualized soft tissues are unremarkable. Normal aeration of the mastoid air cells and paranasal sinuses. No evidence of odontogenic source for the soft tissue cellulitis.  IMPRESSION: Focal skin thickening of the right cheek with mild inflammatory stranding in the subjacent superficial fat but no evidence of focal fluid collection or abscess. Imaging findings are most consistent with cellulitis.  No other acute abnormality.   Electronically Signed   By: Jacqulynn Cadet M.D.   On: 10/17/2014 03:49     EKG  Interpretation None      MDM   Final diagnoses:  Facial cellulitis    Hyperglycemia    Plan admission   Rolland Porter, MD, FACEP   CRITICAL CARE Performed by: Rolland Porter L Total critical care time: 33 min Critical care time was exclusive of separately billable procedures and treating other patients. Critical care was necessary to treat or prevent imminent or life-threatening deterioration. Critical care was time spent personally by me on the following activities: development of treatment plan with patient and/or surrogate as well as nursing, discussions with consultants, evaluation of patient's response to treatment, examination of patient, obtaining history from patient or surrogate, ordering and performing treatments and interventions, ordering and review of laboratory studies, ordering and review of radiographic studies, pulse oximetry and re-evaluation of patient's condition.     Rolland Porter, MD 10/17/14 [phone]

## 2014-10-18 LAB — HEMOGLOBIN A1C
Hgb A1c MFr Bld: 13.3 % — ABNORMAL HIGH (ref 4.8–5.6)
Mean Plasma Glucose: 335 mg/dL

## 2014-10-19 ENCOUNTER — Encounter (HOSPITAL_COMMUNITY): Payer: Self-pay | Admitting: *Deleted

## 2014-10-19 ENCOUNTER — Emergency Department (HOSPITAL_COMMUNITY)
Admission: EM | Admit: 2014-10-19 | Discharge: 2014-10-19 | Disposition: A | Discharge status: Home / Self Care | Payer: Medicare Other | Attending: Emergency Medicine | Admitting: Emergency Medicine

## 2014-10-19 DIAGNOSIS — L97509 Non-pressure chronic ulcer of other part of unspecified foot with unspecified severity: Secondary | ICD-10-CM | POA: Insufficient documentation

## 2014-10-19 DIAGNOSIS — R51 Headache: Secondary | ICD-10-CM | POA: Insufficient documentation

## 2014-10-19 DIAGNOSIS — E11621 Type 2 diabetes mellitus with foot ulcer: Secondary | ICD-10-CM | POA: Insufficient documentation

## 2014-10-19 DIAGNOSIS — H53149 Visual discomfort, unspecified: Secondary | ICD-10-CM | POA: Insufficient documentation

## 2014-10-19 DIAGNOSIS — F329 Major depressive disorder, single episode, unspecified: Secondary | ICD-10-CM | POA: Diagnosis not present

## 2014-10-19 DIAGNOSIS — I10 Essential (primary) hypertension: Secondary | ICD-10-CM | POA: Diagnosis not present

## 2014-10-19 DIAGNOSIS — Z794 Long term (current) use of insulin: Secondary | ICD-10-CM | POA: Diagnosis not present

## 2014-10-19 DIAGNOSIS — Z87448 Personal history of other diseases of urinary system: Secondary | ICD-10-CM | POA: Insufficient documentation

## 2014-10-19 DIAGNOSIS — K219 Gastro-esophageal reflux disease without esophagitis: Secondary | ICD-10-CM | POA: Insufficient documentation

## 2014-10-19 DIAGNOSIS — Z79899 Other long term (current) drug therapy: Secondary | ICD-10-CM | POA: Insufficient documentation

## 2014-10-19 DIAGNOSIS — H5712 Ocular pain, left eye: Secondary | ICD-10-CM | POA: Insufficient documentation

## 2014-10-19 DIAGNOSIS — F419 Anxiety disorder, unspecified: Secondary | ICD-10-CM | POA: Diagnosis not present

## 2014-10-19 DIAGNOSIS — G8929 Other chronic pain: Secondary | ICD-10-CM | POA: Diagnosis not present

## 2014-10-19 LAB — CBG MONITORING, ED: GLUCOSE-CAPILLARY: 183 mg/dL — AB (ref 70–99)

## 2014-10-19 MED ORDER — FLUORESCEIN SODIUM 1 MG OP STRP
ORAL_STRIP | OPHTHALMIC | Status: AC
  Administered 2014-10-19: 1 via INTRAOCULAR
  Filled 2014-10-19: qty 1

## 2014-10-19 MED ORDER — OXYCODONE-ACETAMINOPHEN 5-325 MG PO TABS
1.0000 | ORAL_TABLET | Freq: Once | ORAL | Status: AC
Start: 2014-10-19 — End: 2014-10-19
  Administered 2014-10-19: 1 via ORAL
  Filled 2014-10-19: qty 1

## 2014-10-19 MED ORDER — TETRACAINE HCL 0.5 % OP SOLN
2.0000 [drp] | Freq: Once | OPHTHALMIC | Status: AC
  Administered 2014-10-19: 2 [drp] via OPHTHALMIC
  Filled 2014-10-19: qty 2

## 2014-10-19 MED ORDER — ERYTHROMYCIN 5 MG/GM OP OINT
TOPICAL_OINTMENT | Freq: Three times a day (TID) | OPHTHALMIC | Status: DC
  Administered 2014-10-19: 1 via OPHTHALMIC
  Filled 2014-10-19: qty 3.5

## 2014-10-19 NOTE — ED Provider Notes (Signed)
CSN: [id number]     Arrival date & time 10/19/14  0908 History  This chart was scribed for Christopher Fraise, MD by Mercy Moore, ED scribe.  This patient was seen in room [facility] and the patient's care was started at 10:04 AM.   Chief Complaint  Patient presents with  . Eye Pain  Patient gave verbal permission to utilize photo for medical documentation only The image was not stored on any personal device  Patient is a 39 y.o. male presenting with eye pain. The history is provided by the patient. No language interpreter was used.  Eye Pain This is a new problem. The current episode started 3 to 5 hours ago. The problem occurs constantly. The problem has been gradually worsening. Associated symptoms include headaches. Pertinent negatives include no chest pain and no abdominal pain.   HPI Comments: Christopher Raymond is a 39 y.o. male who presents to the Emergency Department complaining of left eye pain and left facial pressure upon wakening this morning. Patient does not wear contact lenses. Patient denies direct injury,  trauma to the eye, or presence of foreign body.  Patient with history of right facial cellulitis for which he was admitted for two days ago; discharged yesterday. Patient reports head CT obtained that resulted negatively.     Past Medical History  Diagnosis Date  . GERD (gastroesophageal reflux disease)   . Diabetes mellitus   . HTN (hypertension)   . Depression   . Anxiety   . Chronic pain   . ED (erectile dysfunction)   . Hyperlipidemia   . Diabetic foot ulcer   . Cellulitis of face    Past Surgical History  Procedure Laterality Date  . Knee surgery    . Back surgery    . Cholecystectomy  2009  . Tonsillectomy    . Cholecystectomy open    . Wound debridement Left 10/05/2013    Procedure: INCISION AND DEBRIDEMENT LEFT FOOT;  Surgeon: Jamesetta So, MD;  Location: [facility];  Service: General;  Laterality: Left;   Family History  Problem Relation Age of Onset  .  Coronary artery disease Father   . Heart attack Father   . Hypertension Mother    History  Substance Use Topics  . Smoking status: Never Smoker   . Smokeless tobacco: Current User    Types: Snuff     Comment: dip x 20  . Alcohol Use: Yes     Comment: very little 4-5 beers/yr    Review of Systems  Constitutional: Negative for fever.  HENT: Negative for trouble swallowing.   Eyes: Positive for photophobia and pain. Negative for visual disturbance.  Cardiovascular: Negative for chest pain.  Gastrointestinal: Negative for nausea, vomiting and abdominal pain.  Musculoskeletal: Negative for back pain and neck pain.  Neurological: Positive for headaches. Negative for weakness.      Allergies  Levaquin  Home Medications   Prior to Admission medications   Medication Sig Start Date End Date Taking? Authorizing Provider  acetaminophen (TYLENOL) 500 MG tablet Take 1,000 mg by mouth every 6 (six) hours as needed for headache.    Historical Provider, MD  alprazolam Duanne Moron) 2 MG tablet Take 1 tablet (2 mg total) by mouth at bedtime as needed for sleep. 10/17/14   Radene Gunning, NP  amLODipine (NORVASC) 10 MG tablet Take 0.5 tablets (5 mg total) by mouth daily. 10/17/14   Radene Gunning, NP  cyclobenzaprine (FLEXERIL) 5 MG tablet Take 1 tablet (5 mg  total) by mouth 3 (three) times daily as needed for muscle spasms. 10/17/14   Radene Gunning, NP  doxycycline (VIBRA-TABS) 100 MG tablet Take 1 tablet (100 mg total) by mouth every 12 (twelve) hours. 10/17/14   Radene Gunning, NP  escitalopram (LEXAPRO) 20 MG tablet Take 1 tablet (20 mg total) by mouth daily. 10/17/14   Radene Gunning, NP  esomeprazole (NEXIUM) 40 MG capsule Take 1 capsule (40 mg total) by mouth daily before breakfast. 03/30/13   Julianne Rice, MD  insulin aspart (NOVOLOG) 100 UNIT/ML FlexPen Inject 5 Units into the skin 3 (three) times daily with meals. 09/15/14   Kathie Dike, MD  insulin glargine (LANTUS) 100 UNIT/ML injection Inject  0.6 mLs (60 Units total) into the skin daily before supper. 10/17/14   Radene Gunning, NP  lisinopril (PRINIVIL,ZESTRIL) 20 MG tablet Take 1 tablet (20 mg total) by mouth daily. 10/17/14   Radene Gunning, NP  oxyCODONE (ROXICODONE) 15 MG immediate release tablet Take 1 tablet (15 mg total) by mouth every 4 (four) hours as needed for pain. 10/17/14   Radene Gunning, NP   Triage Vitals: BP 162/100 mmHg  Pulse 88  Temp(Src) 98.2 F (36.8 C) (Oral)  Resp 18  Ht 6\' 1"  (1.854 m)  Wt 256 lb (116.121 kg)  BMI 33.78 kg/m2  SpO2 100% Physical Exam  Nursing note and vitals reviewed. CONSTITUTIONAL: Well developed/well nourished HEAD: Normocephalic/atraumatic EYES: EOMI, left pupil constricted but reactive, no nystagmus, no ptosis, mild left conjunctival erythema no abrasion, no foreign body in OS, IOP 17 in OS, no cell/flare.  See photo below ENMT: Mucous membranes moist NECK: supple no meningeal signs, no bruits SPINE/BACK:entire spine nontender CV: S1/S2 noted, no murmurs/rubs/gallops noted LUNGS: Lungs are clear to auscultation bilaterally, no apparent distress ABDOMEN: soft, nontender, no rebound or guarding GU:no cva tenderness NEURO:Awake/alert, facies symmetric, no arm or leg drift is noted Equal 5/5 strength with shoulder abduction, elbow flex/extension, wrist flex/extension in upper extremities and equal hand grips bilaterally Equal 5/5 strength with hip flexion,knee flex/extension, foot dorsi/plantar flexion Cranial nerves 3/4/5/6/12/30/08/11/12 tested and intact Gait normal without ataxia No past pointing Sensation to light touch intact in all extremities EXTREMITIES: pulses normal, full ROM SKIN: warm, color normal PSYCH: no abnormalities of mood noted, alert and oriented to situation        ED Course  Procedures   COORDINATION OF CARE: 10:12 AM- Discussed treatment plan with patient at bedside and patient agreed to plan.   Labs Review Labs Reviewed  CBG MONITORING, ED -  Abnormal; Notable for the following:    Glucose-Capillary 183 (*)    All other components within normal limits    Recent CT face negative No signs of acute orbital cellulitis No signs of acute glaucoma He is improved He does have mild OS conjunctival injection No signs of trauma May have early iritis Will give erythromycin eye ointment and referred to ophtho     MDM   Final diagnoses:  Pain in left eye    Nursing notes including past medical history and social history reviewed and considered in documentation Labs/vital reviewed myself and considered during evaluation Previous records reviewed and considered    I personally performed the services described in this documentation, which was scribed in my presence. The recorded information has been reviewed and is accurate.      Christopher Fraise, MD 10/19/14 1620

## 2014-10-19 NOTE — ED Notes (Signed)
Pt states he was here two days ago for abscess to right side of face and was admitted. Pain is now to left eye, with pressure to the left side of the face as well. Pt states he was admitted at 0600 upstairs and discharged at 1400. Pt states severe pain when attempting to open either eye.

## 2014-10-22 LAB — CULTURE, BLOOD (ROUTINE X 2)
Culture: NO GROWTH
Culture: NO GROWTH

## 2014-11-15 ENCOUNTER — Encounter (HOSPITAL_COMMUNITY): Payer: Self-pay | Admitting: Cardiology

## 2014-11-15 ENCOUNTER — Emergency Department (HOSPITAL_COMMUNITY)
Admission: EM | Admit: 2014-11-15 | Discharge: 2014-11-15 | Disposition: A | Discharge status: Home / Self Care | Payer: Medicare Other | Attending: Emergency Medicine | Admitting: Emergency Medicine

## 2014-11-15 DIAGNOSIS — G8929 Other chronic pain: Secondary | ICD-10-CM | POA: Diagnosis not present

## 2014-11-15 DIAGNOSIS — Z872 Personal history of diseases of the skin and subcutaneous tissue: Secondary | ICD-10-CM | POA: Diagnosis not present

## 2014-11-15 DIAGNOSIS — Z79899 Other long term (current) drug therapy: Secondary | ICD-10-CM | POA: Diagnosis not present

## 2014-11-15 DIAGNOSIS — E119 Type 2 diabetes mellitus without complications: Secondary | ICD-10-CM | POA: Diagnosis not present

## 2014-11-15 DIAGNOSIS — Z794 Long term (current) use of insulin: Secondary | ICD-10-CM | POA: Diagnosis not present

## 2014-11-15 DIAGNOSIS — D419 Neoplasm of uncertain behavior of unspecified urinary organ: Secondary | ICD-10-CM | POA: Insufficient documentation

## 2014-11-15 DIAGNOSIS — M5432 Sciatica, left side: Secondary | ICD-10-CM | POA: Insufficient documentation

## 2014-11-15 DIAGNOSIS — D329 Benign neoplasm of meninges, unspecified: Secondary | ICD-10-CM | POA: Insufficient documentation

## 2014-11-15 DIAGNOSIS — K219 Gastro-esophageal reflux disease without esophagitis: Secondary | ICD-10-CM | POA: Insufficient documentation

## 2014-11-15 DIAGNOSIS — M545 Low back pain: Secondary | ICD-10-CM | POA: Diagnosis present

## 2014-11-15 DIAGNOSIS — I10 Essential (primary) hypertension: Secondary | ICD-10-CM | POA: Insufficient documentation

## 2014-11-15 DIAGNOSIS — Z87448 Personal history of other diseases of urinary system: Secondary | ICD-10-CM | POA: Diagnosis not present

## 2014-11-15 DIAGNOSIS — Z792 Long term (current) use of antibiotics: Secondary | ICD-10-CM | POA: Insufficient documentation

## 2014-11-15 MED ORDER — NAPROXEN 500 MG PO TABS: 500.0000 mg | ORAL_TABLET | Freq: Two times a day (BID) | ORAL | Status: DC

## 2014-11-15 MED ORDER — OXYCODONE-ACETAMINOPHEN 5-325 MG PO TABS
2.0000 | ORAL_TABLET | Freq: Once | ORAL | Status: AC
Start: 2014-11-15 — End: 2014-11-15
  Administered 2014-11-15: 2 via ORAL
  Filled 2014-11-15: qty 2

## 2014-11-15 NOTE — Discharge Instructions (Signed)
Chronic Back Pain ° When back pain lasts longer than 3 months, it is called chronic back pain. People with chronic back pain often go through certain periods that are more intense (flare-ups).  °CAUSES °Chronic back pain can be caused by wear and tear (degeneration) on different structures in your back. These structures include: °· The bones of your spine (vertebrae) and the joints surrounding your spinal cord and nerve roots (facets). °· The strong, fibrous tissues that connect your vertebrae (ligaments). °Degeneration of these structures may result in pressure on your nerves. This can lead to constant pain. °HOME CARE INSTRUCTIONS °· Avoid bending, heavy lifting, prolonged sitting, and activities which make the problem worse. °· Take brief periods of rest throughout the day to reduce your pain. Lying down or standing usually is better than sitting while you are resting. °· Take over-the-counter or prescription medicines only as directed by your caregiver. °SEEK IMMEDIATE MEDICAL CARE IF:  °· You have weakness or numbness in one of your legs or feet. °· You have trouble controlling your bladder or bowels. °· You have nausea, vomiting, abdominal pain, shortness of breath, or fainting. °Document Released: 07/18/2004 Document Revised: 09/02/2011 Document Reviewed: 05/25/2011 °[employer]® Patient Information ©2015 [employer]. This information is not intended to replace advice given to you by your health care provider. Make sure you discuss any questions you have with your health care provider. ° °Sciatica °Sciatica is pain, weakness, numbness, or tingling along your sciatic nerve. The nerve starts in the lower back and runs down the back of each leg. Nerve damage or certain conditions pinch or put pressure on the sciatic nerve. This causes the pain, weakness, and other discomforts of sciatica. °HOME CARE  °· Only take medicine as told by your doctor. °· Apply ice to the affected area for 20 minutes. Do this 3-4 times  a day for the first 48-72 hours. Then try heat in the same way. °· Exercise, stretch, or do your usual activities if these do not make your pain worse. °· Go to physical therapy as told by your doctor. °· Keep all doctor visits as told. °· Do not wear high heels or shoes that are not supportive. °· Get a firm mattress if your mattress is too soft to lessen pain and discomfort. °GET HELP RIGHT AWAY IF:  °· You cannot control when you poop (bowel movement) or pee (urinate). °· You have more weakness in your lower back, lower belly (pelvis), butt (buttocks), or legs. °· You have redness or puffiness (swelling) of your back. °· You have a burning feeling when you pee. °· You have pain that gets worse when you lie down. °· You have pain that wakes you from your sleep. °· Your pain is worse than past pain. °· Your pain lasts longer than 4 weeks. °· You are suddenly losing weight without reason. °MAKE SURE YOU:  °· Understand these instructions. °· Will watch this condition. °· Will get help right away if you are not doing well or get worse. °Document Released: 03/19/2008 Document Revised: 12/10/2011 Document Reviewed: 10/20/2011 °[employer]® Patient Information ©2015 [employer]. This information is not intended to replace advice given to you by your health care provider. Make sure you discuss any questions you have with your health care provider. ° °

## 2014-11-15 NOTE — ED Notes (Signed)
Lower back pain and left leg pain since this morning.  Denies any injury.

## 2014-11-18 NOTE — ED Provider Notes (Signed)
CSN: [id number]     Arrival date & time 11/15/14  1829 History   First MD Initiated Contact with Patient 11/15/14 1906     Chief Complaint  Patient presents with  . Back Pain     (Consider location/radiation/quality/duration/timing/severity/associated sxs/prior Treatment) HPI  Christopher Raymond is a 39 y.o. male who presents to the Emergency Department complaining of low back pain.  Pain began earlier on the day of arrival.  He describes the pain as dull and aching with pain across his lower back.  Pain is worse with sitting and walking, improves at rest.  He denies known injury, radiation of pain to the lower extremities, urine or bowel changes, abdominal pain, fever or chills.  He has not tried any medication for his symptoms   Past Medical History  Diagnosis Date  . GERD (gastroesophageal reflux disease)   . Diabetes mellitus   . HTN (hypertension)   . Depression   . Anxiety   . Chronic pain   . ED (erectile dysfunction)   . Hyperlipidemia   . Diabetic foot ulcer   . Cellulitis of face    Past Surgical History  Procedure Laterality Date  . Knee surgery    . Back surgery    . Cholecystectomy  2009  . Tonsillectomy    . Cholecystectomy open    . Wound debridement Left 10/05/2013    Procedure: INCISION AND DEBRIDEMENT LEFT FOOT;  Surgeon: Jamesetta So, MD;  Location: [facility];  Service: General;  Laterality: Left;   Family History  Problem Relation Age of Onset  . Coronary artery disease Father   . Heart attack Father   . Hypertension Mother    History  Substance Use Topics  . Smoking status: Never Smoker   . Smokeless tobacco: Current User    Types: Snuff     Comment: dip x 20  . Alcohol Use: Yes     Comment: very little 4-5 beers/yr    Review of Systems  Constitutional: Negative for fever.  Respiratory: Negative for shortness of breath.   Gastrointestinal: Negative for vomiting, abdominal pain and constipation.  Genitourinary: Negative for dysuria, hematuria,  flank pain, decreased urine volume and difficulty urinating.  Musculoskeletal: Positive for back pain. Negative for joint swelling.  Skin: Negative for rash.  Neurological: Negative for weakness and numbness.  All other systems reviewed and are negative.     Allergies  Levaquin  Home Medications   Prior to Admission medications   Medication Sig Start Date End Date Taking? Authorizing Provider  alprazolam Duanne Moron) 2 MG tablet Take 1 tablet (2 mg total) by mouth at bedtime as needed for sleep. 10/17/14   Radene Gunning, NP  amLODipine (NORVASC) 10 MG tablet Take 0.5 tablets (5 mg total) by mouth daily. 10/17/14   Radene Gunning, NP  cyclobenzaprine (FLEXERIL) 5 MG tablet Take 1 tablet (5 mg total) by mouth 3 (three) times daily as needed for muscle spasms. Patient not taking: Reported on 10/19/2014 10/17/14   Radene Gunning, NP  doxycycline (VIBRA-TABS) 100 MG tablet Take 1 tablet (100 mg total) by mouth every 12 (twelve) hours. 10/17/14   Radene Gunning, NP  escitalopram (LEXAPRO) 20 MG tablet Take 1 tablet (20 mg total) by mouth daily. 10/17/14   Radene Gunning, NP  esomeprazole (NEXIUM) 40 MG capsule Take 1 capsule (40 mg total) by mouth daily before breakfast. 03/30/13   Julianne Rice, MD  insulin aspart (NOVOLOG) 100 UNIT/ML FlexPen Inject 5  Units into the skin 3 (three) times daily with meals. 09/15/14   Kathie Dike, MD  insulin glargine (LANTUS) 100 UNIT/ML injection Inject 0.6 mLs (60 Units total) into the skin daily before supper. 10/17/14   Radene Gunning, NP  lisinopril (PRINIVIL,ZESTRIL) 20 MG tablet Take 1 tablet (20 mg total) by mouth daily. 10/17/14   Radene Gunning, NP  naproxen (NAPROSYN) 500 MG tablet Take 1 tablet (500 mg total) by mouth 2 (two) times daily with a meal. 11/15/14   Garlin Batdorf, PA-C  oxyCODONE (ROXICODONE) 15 MG immediate release tablet Take 1 tablet (15 mg total) by mouth every 4 (four) hours as needed for pain. 10/17/14   Radene Gunning, NP   BP 152/88 mmHg  Pulse  99  Temp(Src) 98.9 F (37.2 C) (Oral)  Resp 20  Ht 6\' 1"  (1.854 m)  Wt 250 lb (113.399 kg)  BMI 32.99 kg/m2  SpO2 100% Physical Exam  Constitutional: He is oriented to person, place, and time. He appears well-developed and well-nourished. No distress.  HENT:  Head: Normocephalic and atraumatic.  Neck: Normal range of motion. Neck supple.  Cardiovascular: Normal rate, regular rhythm, normal heart sounds and intact distal pulses.   No murmur heard. Pulmonary/Chest: Effort normal and breath sounds normal. No respiratory distress.  Abdominal: Soft. He exhibits no distension. There is no tenderness. There is no rebound and no guarding.  Musculoskeletal: He exhibits tenderness. He exhibits no edema.       Lumbar back: He exhibits tenderness and pain. He exhibits normal range of motion, no swelling, no deformity, no laceration and normal pulse.  ttp of the bilateral lumbar paraspinal muscles.  No spinal tenderness.  DP pulses are brisk and symmetrical.  Distal sensation intact.  Hip Flexors/Extensors are intact.  Pt has 5/5 strength against resistance of bilateral lower extremities.     Neurological: He is alert and oriented to person, place, and time. He has normal strength. No sensory deficit. He exhibits normal muscle tone. Coordination and gait normal.  Reflex Scores:      Patellar reflexes are 2+ on the right side and 2+ on the left side.      Achilles reflexes are 2+ on the right side and 2+ on the left side. Skin: Skin is warm and dry. No rash noted.  Nursing note and vitals reviewed.   ED Course  Procedures (including critical care time) Labs Review Labs Reviewed - No data to display  Imaging Review No results found.   EKG Interpretation None      MDM   Final diagnoses:  Sciatica neuralgia, left   Pt is ambulatory, no focal neuro deficits.  No concerning sx's for emergent neurological deficits.  Likely musculoskeletal.  Pt agrees to symptomatic tx and close PMD f/u if  needed    Bufford Lope 11/18/14 2126  Ripley Fraise, MD 11/18/14 2332

## 2015-01-02 ENCOUNTER — Emergency Department (HOSPITAL_COMMUNITY)
Admission: EM | Admit: 2015-01-02 | Discharge: 2015-01-02 | Disposition: A | Discharge status: Home / Self Care | Payer: Medicare Other | Attending: Emergency Medicine | Admitting: Emergency Medicine

## 2015-01-02 ENCOUNTER — Encounter (HOSPITAL_COMMUNITY): Payer: Self-pay | Admitting: Emergency Medicine

## 2015-01-02 ENCOUNTER — Emergency Department (HOSPITAL_COMMUNITY): Payer: Medicare Other

## 2015-01-02 DIAGNOSIS — F419 Anxiety disorder, unspecified: Secondary | ICD-10-CM | POA: Diagnosis not present

## 2015-01-02 DIAGNOSIS — K219 Gastro-esophageal reflux disease without esophagitis: Secondary | ICD-10-CM | POA: Diagnosis not present

## 2015-01-02 DIAGNOSIS — Z79899 Other long term (current) drug therapy: Secondary | ICD-10-CM | POA: Diagnosis not present

## 2015-01-02 DIAGNOSIS — F329 Major depressive disorder, single episode, unspecified: Secondary | ICD-10-CM | POA: Insufficient documentation

## 2015-01-02 DIAGNOSIS — Z794 Long term (current) use of insulin: Secondary | ICD-10-CM | POA: Diagnosis not present

## 2015-01-02 DIAGNOSIS — M545 Low back pain, unspecified: Secondary | ICD-10-CM

## 2015-01-02 DIAGNOSIS — L97509 Non-pressure chronic ulcer of other part of unspecified foot with unspecified severity: Secondary | ICD-10-CM | POA: Insufficient documentation

## 2015-01-02 DIAGNOSIS — E11621 Type 2 diabetes mellitus with foot ulcer: Secondary | ICD-10-CM | POA: Insufficient documentation

## 2015-01-02 DIAGNOSIS — Z87448 Personal history of other diseases of urinary system: Secondary | ICD-10-CM | POA: Insufficient documentation

## 2015-01-02 DIAGNOSIS — I10 Essential (primary) hypertension: Secondary | ICD-10-CM | POA: Insufficient documentation

## 2015-01-02 DIAGNOSIS — Z791 Long term (current) use of non-steroidal anti-inflammatories (NSAID): Secondary | ICD-10-CM | POA: Diagnosis not present

## 2015-01-02 DIAGNOSIS — G8929 Other chronic pain: Secondary | ICD-10-CM | POA: Insufficient documentation

## 2015-01-02 DIAGNOSIS — R109 Unspecified abdominal pain: Secondary | ICD-10-CM | POA: Diagnosis present

## 2015-01-02 DIAGNOSIS — R319 Hematuria, unspecified: Secondary | ICD-10-CM | POA: Insufficient documentation

## 2015-01-02 DIAGNOSIS — M549 Dorsalgia, unspecified: Secondary | ICD-10-CM

## 2015-01-02 HISTORY — DX: Malingerer (conscious simulation): Z76.5

## 2015-01-02 HISTORY — DX: Cervicalgia: M54.2

## 2015-01-02 HISTORY — DX: Other chronic pain: G89.29

## 2015-01-02 HISTORY — DX: Dorsalgia, unspecified: M54.9

## 2015-01-02 HISTORY — DX: Radiculopathy, lumbar region: M54.16

## 2015-01-02 HISTORY — DX: Radiculopathy, cervical region: M54.12

## 2015-01-02 LAB — CBC
HCT: 37.6 % — ABNORMAL LOW (ref 39.0–52.0)
Hemoglobin: 12.7 g/dL — ABNORMAL LOW (ref 13.0–17.0)
MCH: 27.6 pg (ref 26.0–34.0)
MCHC: 33.8 g/dL (ref 30.0–36.0)
MCV: 81.7 fL (ref 78.0–100.0)
PLATELETS: 225 10*3/uL (ref 150–400)
RBC: 4.6 MIL/uL (ref 4.22–5.81)
RDW: 13.5 % (ref 11.5–15.5)
WBC: 4.3 10*3/uL (ref 4.0–10.5)

## 2015-01-02 LAB — URINALYSIS, ROUTINE W REFLEX MICROSCOPIC
Bilirubin Urine: NEGATIVE
Glucose, UA: >1000 mg/dL — AB
KETONES UR: NEGATIVE mg/dL
Leukocytes, UA: NEGATIVE
NITRITE: NEGATIVE
PH: 5.5 (ref 5.0–8.0)
Protein, ur: NEGATIVE mg/dL
Specific Gravity, Urine: 1.015 (ref 1.005–1.030)
Urobilinogen, UA: 0.2 mg/dL (ref 0.0–1.0)

## 2015-01-02 LAB — COMPREHENSIVE METABOLIC PANEL
ALK PHOS: 64 U/L (ref 38–126)
ALT: 16 U/L — AB (ref 17–63)
AST: 15 U/L (ref 15–41)
Albumin: 4.4 g/dL (ref 3.5–5.0)
Anion gap: 11 (ref 5–15)
BILIRUBIN TOTAL: 0.8 mg/dL (ref 0.3–1.2)
BUN: 11 mg/dL (ref 6–20)
CHLORIDE: 97 mmol/L — AB (ref 101–111)
CO2: 25 mmol/L (ref 22–32)
CREATININE: 0.92 mg/dL (ref 0.61–1.24)
Calcium: 9 mg/dL (ref 8.9–10.3)
GFR calc Af Amer: >60 mL/min (ref >60)
GFR calc non Af Amer: >60 mL/min (ref >60)
Glucose, Bld: 440 mg/dL — ABNORMAL HIGH (ref 65–99)
Potassium: 4.2 mmol/L (ref 3.5–5.1)
Sodium: 133 mmol/L — ABNORMAL LOW (ref 135–145)
Total Protein: 7.5 g/dL (ref 6.5–8.1)

## 2015-01-02 LAB — URINE MICROSCOPIC-ADD ON

## 2015-01-02 NOTE — ED Notes (Signed)
Pt left with out discharged paper.

## 2015-01-02 NOTE — ED Provider Notes (Signed)
CSN: [id number]     Arrival date & time 01/02/15  1548 History   First MD Initiated Contact with Patient 01/02/15 1953     Chief Complaint  Patient presents with  . Flank Pain      HPI  Pt was seen at 1950.  Per pt, c/o gradual onset and persistence of constant right sided flank "pain" for the past 2 days.  Pt describes the pain as "like my last kidney stone," and radiating into the right side of his abd. Has been associated with no other symptoms. Pt states he has not been evaluated by any other medical provider previously for this pain and has not taken any meds to treat his pain.   Denies testicular pain/swelling, no dysuria/hematuria, no abd pain, no N/V, no diarrhea, no CP/SOB.    Past Medical History  Diagnosis Date  . GERD (gastroesophageal reflux disease)   . Diabetes mellitus   . HTN (hypertension)   . Depression   . Anxiety   . Chronic pain   . ED (erectile dysfunction)   . Hyperlipidemia   . Diabetic foot ulcer   . Cellulitis of face   . Chronic neck and back pain   . Cervical radiculopathy   . Lumbar radiculopathy   . Drug-seeking behavior    Past Surgical History  Procedure Laterality Date  . Knee surgery    . Back surgery    . Cholecystectomy  2009  . Tonsillectomy    . Cholecystectomy open    . Wound debridement Left 10/05/2013    Procedure: INCISION AND DEBRIDEMENT LEFT FOOT;  Surgeon: Jamesetta So, MD;  Location: [facility];  Service: General;  Laterality: Left;   Family History  Problem Relation Age of Onset  . Coronary artery disease Father   . Heart attack Father   . Hypertension Mother    History  Substance Use Topics  . Smoking status: Never Smoker   . Smokeless tobacco: Current User    Types: Snuff     Comment: dip x 20  . Alcohol Use: Yes     Comment: very little 4-5 beers/yr    Review of Systems ROS: Statement: All systems negative except as marked or noted in the HPI; Constitutional: Negative for fever and chills. ; ; Eyes: Negative for  eye pain, redness and discharge. ; ; ENMT: Negative for ear pain, hoarseness, nasal congestion, sinus pressure and sore throat. ; ; Cardiovascular: Negative for chest pain, palpitations, diaphoresis, dyspnea and peripheral edema. ; ; Respiratory: Negative for cough, wheezing and stridor. ; ; Gastrointestinal: Negative for nausea, vomiting, diarrhea, abdominal pain, blood in stool, hematemesis, jaundice and rectal bleeding. . ; ; Genitourinary: Negative for dysuria, flank pain and hematuria. ; ; Genital:  No penile drainage or rash, no testicular pain or swelling, no scrotal rash or swelling. ;; Musculoskeletal: +LBP. Negative for neck pain. Negative for swelling and trauma.; ; Skin: Negative for pruritus, rash, abrasions, blisters, bruising and skin lesion.; ; Neuro: Negative for headache, lightheadedness and neck stiffness. Negative for weakness, altered level of consciousness , altered mental status, extremity weakness, paresthesias, involuntary movement, seizure and syncope.      Allergies  Levaquin  Home Medications   Prior to Admission medications   Medication Sig Start Date End Date Taking? Authorizing Provider  alprazolam Duanne Moron) 2 MG tablet Take 1 tablet (2 mg total) by mouth at bedtime as needed for sleep. 10/17/14   Radene Gunning, NP  amLODipine (NORVASC) 10 MG tablet Take  0.5 tablets (5 mg total) by mouth daily. 10/17/14   Radene Gunning, NP  cyclobenzaprine (FLEXERIL) 5 MG tablet Take 1 tablet (5 mg total) by mouth 3 (three) times daily as needed for muscle spasms. Patient not taking: Reported on 10/19/2014 10/17/14   Radene Gunning, NP  doxycycline (VIBRA-TABS) 100 MG tablet Take 1 tablet (100 mg total) by mouth every 12 (twelve) hours. 10/17/14   Radene Gunning, NP  escitalopram (LEXAPRO) 20 MG tablet Take 1 tablet (20 mg total) by mouth daily. 10/17/14   Radene Gunning, NP  esomeprazole (NEXIUM) 40 MG capsule Take 1 capsule (40 mg total) by mouth daily before breakfast. 03/30/13   Julianne Rice, MD  insulin aspart (NOVOLOG) 100 UNIT/ML FlexPen Inject 5 Units into the skin 3 (three) times daily with meals. 09/15/14   Kathie Dike, MD  insulin glargine (LANTUS) 100 UNIT/ML injection Inject 0.6 mLs (60 Units total) into the skin daily before supper. 10/17/14   Radene Gunning, NP  lisinopril (PRINIVIL,ZESTRIL) 20 MG tablet Take 1 tablet (20 mg total) by mouth daily. 10/17/14   Radene Gunning, NP  naproxen (NAPROSYN) 500 MG tablet Take 1 tablet (500 mg total) by mouth 2 (two) times daily with a meal. 11/15/14   Tammy Triplett, PA-C  oxyCODONE (ROXICODONE) 15 MG immediate release tablet Take 1 tablet (15 mg total) by mouth every 4 (four) hours as needed for pain. 10/17/14   Radene Gunning, NP   BP 130/79 mmHg  Pulse 86  Temp(Src) 98.2 F (36.8 C) (Oral)  Resp 14  Ht 6\' 1"  (1.854 m)  Wt 240 lb (108.863 kg)  BMI 31.67 kg/m2  SpO2 100% Physical Exam  1955: Physical examination:  Nursing notes reviewed; Vital signs and O2 SAT reviewed;  Constitutional: Well developed, Well nourished, Well hydrated, In no acute distress; Head:  Normocephalic, atraumatic; Eyes: EOMI, PERRL, No scleral icterus; ENMT: Mouth and pharynx normal, Mucous membranes moist; Neck: Supple, Full range of motion, No lymphadenopathy; Cardiovascular: Regular rate and rhythm, No murmur, rub, or gallop; Respiratory: Breath sounds clear & equal bilaterally, No rales, rhonchi, wheezes.  Speaking full sentences with ease, Normal respiratory effort/excursion; Chest: Nontender, Movement normal; Abdomen: Soft, Nontender, Nondistended, Normal bowel sounds; Genitourinary: No CVA tenderness; Spine:  No midline CS, TS, LS tenderness. +TTP right lumbar paraspinal muscles. No rash.;; Extremities: Pulses normal, No tenderness, No edema, No calf edema or asymmetry.; Neuro: AA&Ox3, Major CN grossly intact.  Speech clear. No gross focal motor or sensory deficits in extremities. Strength 5/5 equal bilat UE's and LE's, including great toe  dorsiflexion.  DTR 2/4 equal bilat UE's and LE's.  No gross sensory deficits.  Neg straight leg raises bilat.; Skin: Color normal, Warm, Dry.   ED Course  Procedures      EKG Interpretation None      MDM  MDM Reviewed: previous chart, nursing note and vitals Reviewed previous: labs and CT scan Interpretation: CT scan and labs      Results for orders placed or performed during the hospital encounter of 01/02/15  Comprehensive metabolic panel  Result Value Ref Range   Sodium 133 (L) 135 - 145 mmol/L   Potassium 4.2 3.5 - 5.1 mmol/L   Chloride 97 (L) 101 - 111 mmol/L   CO2 25 22 - 32 mmol/L   Glucose, Bld 440 (H) 65 - 99 mg/dL   BUN 11 6 - 20 mg/dL   Creatinine, Ser 0.92 0.61 - 1.24 mg/dL   Calcium  9.0 8.9 - 10.3 mg/dL   Total Protein 7.5 6.5 - 8.1 g/dL   Albumin 4.4 3.5 - 5.0 g/dL   AST 15 15 - 41 U/L   ALT 16 (L) 17 - 63 U/L   Alkaline Phosphatase 64 38 - 126 U/L   Total Bilirubin 0.8 0.3 - 1.2 mg/dL   GFR calc non Af Amer >60 >60 mL/min   GFR calc Af Amer >60 >60 mL/min   Anion gap 11 5 - 15  CBC  Result Value Ref Range   WBC 4.3 4.0 - 10.5 K/uL   RBC 4.60 4.22 - 5.81 MIL/uL   Hemoglobin 12.7 (L) 13.0 - 17.0 g/dL   HCT 37.6 (L) 39.0 - 52.0 %   MCV 81.7 78.0 - 100.0 fL   MCH 27.6 26.0 - 34.0 pg   MCHC 33.8 30.0 - 36.0 g/dL   RDW 13.5 11.5 - 15.5 %   Platelets 225 150 - 400 K/uL  Urinalysis, Routine w reflex microscopic (not at [facility])  Result Value Ref Range   Color, Urine YELLOW YELLOW   APPearance CLEAR CLEAR   Specific Gravity, Urine 1.015 1.005 - 1.030   pH 5.5 5.0 - 8.0   Glucose, UA >1000 (A) NEGATIVE mg/dL   Hgb urine dipstick LARGE (A) NEGATIVE   Bilirubin Urine NEGATIVE NEGATIVE   Ketones, ur NEGATIVE NEGATIVE mg/dL   Protein, ur NEGATIVE NEGATIVE mg/dL   Urobilinogen, UA 0.2 0.0 - 1.0 mg/dL   Nitrite NEGATIVE NEGATIVE   Leukocytes, UA NEGATIVE NEGATIVE  Urine microscopic-add on  Result Value Ref Range   Squamous Epithelial / LPF FEW (A) RARE    WBC, UA 0-2 <3 WBC/hpf   RBC / HPF TOO NUMEROUS TO COUNT <3 RBC/hpf   Bacteria, UA FEW (A) RARE   Ct Renal Stone Study 01/02/2015   CLINICAL DATA:  Right flank pain radiating into the right lower quadrant for 2 days. No history of kidney stones. Initial encounter.  EXAM: CT ABDOMEN AND PELVIS WITHOUT CONTRAST  TECHNIQUE: Multidetector CT imaging of the abdomen and pelvis was performed following the standard protocol without IV contrast.  COMPARISON:  Abdominal pelvic CT 01/01/2015.  FINDINGS: Lower chest: Clear lung bases. No significant pleural or pericardial effusion.  Hepatobiliary: The liver demonstrates diffusely decreased density consistent with steatosis. No focal abnormalities identified on noncontrast imaging. No evidence of biliary dilatation status post cholecystectomy.  Pancreas: Unremarkable. No pancreatic ductal dilatation or surrounding inflammatory changes.  Spleen: Tiny calcified granulomas.  Otherwise unremarkable.  Adrenals/Urinary Tract: Both adrenal glands appear normal.As evaluated in the noncontrast state, both kidneys appear normal without interval development of urinary tract calculi, hydronephrosis or perinephric soft tissue stranding. No bladder abnormalities identified.  Stomach/Bowel: No evidence of bowel wall thickening, distention or surrounding inflammatory change.The appendix appears normal. Moderate stool throughout the colon.  Vascular/Lymphatic: Small lymph nodes within the porta hepatis and portacaval space are not pathologically enlarged. No significant vascular findings on noncontrast imaging.  Reproductive: Unremarkable.  Other: Tiny supraumbilical hernia containing only fat.  Musculoskeletal: No acute or significant osseous findings. There is a stable convex left scoliosis status post L3-4 TLIF. There are healed fractures of the L3 pars interarticularis. 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  IMPRESSION: 1. No acute findings or significant change from  yesterday's study. No evidence of urinary tract calculus. 2. Hepatic steatosis. 3. The appendix appears normal.   Electronically Signed   By: Richardean Sale M.D.   On: 01/02/2015 17:37   Results  for OSWALD, POTT (MRN [id number]) as of 01/02/2015 20:18  Ref. Range 09/14/2014 04:54 09/15/2014 04:20 10/17/2014 02:04 10/17/2014 05:17 01/02/2015 16:14  Hemoglobin Latest Ref Range: 13.0-17.0 g/dL 11.3 (L) 10.8 (L) 11.2 (L) 11.1 (L) 12.7 (L)  HCT Latest Ref Range: 39.0-52.0 % 32.9 (L) 31.7 (L) 33.7 (L) 32.7 (L) 37.6 (L)     2000:  I asked pt 3 times if he had been evaluated by any other medical provider for his symptoms. Pt told me 3 times: "no." I then informed pt that I was informed that today's CT scan here was is 3rd since 12/29/14 for this complaint. Pt stated: "oh well yeah I was in [place] and it was for back pain, but they didn't do anything." Medical Records reviewed: pt was evaluated at [facility] ED x2 in the past 4 days for the same symptoms as today (previous complaints also included "hematuria" and "groin pain"). I informed pt that he had 3 negative CT scans in the past 4 days. Pt then became agitated, requesting pain medication "because I'm in pain and you're saying I'm not!"  [place] Controlled Substance Database accessed: Pt has received 2 different oxycodone/APAP prescriptions in the past 17 days (most recently 12/29/14), totaling #140 tabs, prescribed by 2 different medical providers, and filled at 2 different pharmacies in 2 different cities in [place]. Pt informed that I never told him he did not have pain, just that he again today had reassuring testing results, and that he would need to f/u with Uro MD. Pt also informed he will not receive further narcotic prescriptions.  I offered to rx him muscle relaxant and pt states he "didn't need it" and "I already have some." I asked pt if he would like to have the ED dose his insulin before he was d/c and pt said "no" and he would "take it when he got home" and began to  use foul language. Pt then stated he was leaving. Pt strongly encouraged to f/u with his PMD and Pain Management doctor for good continuity of care and control of his chronic pain.  Verb understanding.    Francine Graven, DO 01/05/15 1205

## 2015-01-02 NOTE — Discharge Instructions (Signed)
°Emergency Department Resource Guide °1) Find a Doctor and Pay Out of Pocket °Although you won't have to find out who is covered by your insurance plan, it is a good idea to ask around and get recommendations. You will then need to call the office and see if the doctor you have chosen will accept you as a new patient and what types of options they offer for patients who are self-pay. Some doctors offer discounts or will set up payment plans for their patients who do not have insurance, but you will need to ask so you aren't surprised when you get to your appointment. ° °2) Contact Your Local Health Department °Not all health departments have doctors that can see patients for sick visits, but many do, so it is worth a call to see if yours does. If you don't know where your local health department is, you can check in your phone book. The CDC also has a tool to help you locate your state's health department, and many state websites also have listings of all of their local health departments. ° °3) Find a Walk-in Clinic °If your illness is not likely to be very severe or complicated, you may want to try a walk in clinic. These are popping up all over the country in pharmacies, drugstores, and shopping centers. They're usually staffed by nurse practitioners or physician assistants that have been trained to treat common illnesses and complaints. They're usually fairly quick and inexpensive. However, if you have serious medical issues or chronic medical problems, these are probably not your best option. ° °No Primary Care Doctor: °- Call [facility] at  832-8000 - they can help you locate a primary care doctor that  accepts your insurance, provides certain services, etc. °- Physician Referral Service- 1-800-533-3463 ° °Chronic Pain Problems: °Organization         Address  Phone   Notes  °[facility]  (336) 297-2271 Patients need to be referred by their primary care doctor.  ° °Medication  Assistance: °Organization         Address  Phone   Notes  °[place] [facility] [address] °[place], [place] [address] (336) 641-8030 --Must be a resident of [place] °-- Must have NO insurance coverage whatsoever (no Medicaid/ Medicare, etc.) °-- The pt. MUST have a primary care doctor that directs their care regularly and follows them in the community °  °[facility]  (866) 331-1348   °[employer]  (888) 892-1162   ° °Agencies that provide inexpensive medical care: °Organization         Address  Phone   Notes  °[facility]  (336) 832-8035   °[facility]    (336) 832-7272   °[facility] [address] °[place], [place] [address] (336) 832-4777   °[facility] [address], °[place] (336) 271-4999   °[facility]    (336) 373-0678   °[facility]    (336) 272-1050   °[facility] ° [address], [place] Phone:  (336) 832-4444, Fax:  (336) 832-4440 Hours of Operation:  9 am - 6 pm, M-F.  Also accepts Medicaid/Medicare and self-pay.  °[facility] ° [address], [place] Phone: (336) 832-3150, Fax: (336) 832-3151. Hours of Operation:  8:30 am - 5:30 pm, M-F.  Also accepts Medicaid and self-pay.  °[facility] [place] [address], [place] Phone: (336) 878-6027   °[facility] [address], [place], [place] (336)723-1848, Ext. 123 Mondays & Thursdays: 7-9 AM.  First 15 patients are seen on a first come, first serve basis. °  ° °Medicaid-accepting [place] Providers: ° °Organization         Address  Phone   Notes  °[facility] [address], [place] (336) 641-2100 Also accepts self-pay patients.  °[facility] [address], [place] ° (336) 856-9996   °[facility] [address], [place]  (336) 288-8857   °[facility] [address] [place] [address], [place] (336) 299-7000   °Veita Bland [address], [place]  ° (336) 373-1557 Only accepts [place] Access Medicaid patients after they have their name applied to their card.  ° °Self-Pay (no insurance) in [place]: ° °Organization         Address  Phone   Notes  °Sickle Cell Patients, [facility] [address], [place] (336) 832-1970   °[facility] [address], [place] (336) 832-4400   °[facility] [place] ° [address],  (336) 992-4800   °[facility]/Dr. Osei-Bonsu ° [address] [place] [address], [place] or [address], [place] (336) 841-8500 Phone number for both [place] and [place] locations is the same.  °[facility] [address], [place] (336) 299-0000   °[facility] [address] [place] [address], [place] or [address] (336) 852-7530 °(336) 878-2260   °[facility] [address], [place] (336) 350-1642, phone; (336) 294-5005, fax Sees patients 1st and 3rd Saturday of every month.  Must not qualify for public or private insurance (i.e. Medicaid, Medicare, [place] Health Choice, Veterans' Benefits) • Household income should be no more than 200% of the poverty level •The clinic cannot treat you if you are pregnant or think you are pregnant • Sexually transmitted diseases are not treated at the clinic.  ° ° °Dental Care: °Organization         Address  Phone  Notes  °[place] [facility] [address], [place] (336) 641-6152 Accepts children up to age 21 who are enrolled in Medicaid or [place] Health Choice; pregnant women with a Medicaid card; and children who have applied for Medicaid or [place] Health Choice, but were declined, whose parents can pay a reduced fee at time of service.  °[place]  [facility] [place]  [address], [place] (336) 641-7733 Accepts children up to age 21 who are enrolled in Medicaid or [place] Health Choice; pregnant women with a Medicaid card; and children who have applied for Medicaid or [place] Health Choice, but were declined, whose parents can pay a reduced fee at time of service.  °[facility] ° [address], [place] (336) 641-4533 Patients are seen by appointment only. Walk-ins are not accepted. [facility] will see patients 18 years of age and older. °Monday - Tuesday (8am-5pm) °Most Wednesdays (8:30-5pm) °$30 per visit, cash only  °[facility] ° [address], [place] (336) 641-4533 Patients are seen by appointment only. Walk-ins are not accepted. [facility] will see patients 18 years of age and older. °One   Wednesday Evening (Monthly: Volunteer Based).  $30 per visit, cash only  °[facility]  (919) 537-3737 for adults; Children under age 4, call Graduate Pediatric Dentistry at ([phone]) [phone]. Children aged 4-14, please call (919) [phone] to request a pediatric application. ° Dental services are provided in all areas of dental care including fillings, crowns and bridges, complete and partial dentures, implants, gum treatment, root canals, and extractions. Preventive care is also provided. Treatment is provided to both adults and children. °Patients are selected via a lottery and there is often a waiting list. °  °[facility] [address], °[place] ° (336) 763-8833 [email or web address] °  °[facility] [address], [place], [place] (336)723-1848, Ext. 123 Second and Fourth Thursday of each month, opens at 6:30 AM; Clinic ends at 9 AM.  Patients are seen on a first-come first-served basis, and a limited number are seen during each clinic.  ° °[facility] ° [address], [place], [place] (336) [phone]    Eligibility Requirements °You must have lived in [place], [place], or [place] counties for at least the last three months. °  You cannot be eligible for state or federal sponsored healthcare insurance, including Veterans Administration, Medicaid, or Medicare. °  You generally cannot be eligible for healthcare insurance through your employer.  °  How to apply: °Eligibility screenings are held every Tuesday and Wednesday afternoon from 1:00 pm until 4:00 pm. You do not need an appointment for the interview!  °[facility] [address], [place], [place] 336-631-2330   °[facility]  336-342-8273   °[facility]  336-703-3100   °[facility]  336-570-6415   ° °Behavioral Health Resources in the Community: °Intensive Outpatient Programs °Organization         Address  Phone  Notes  °[facility] [address], [place], [place] 336-878-6098   °[facility] [address], [place], [place] 336-832-9800   °[facility] [address], [place], [place] ° 336-882-2125   °[facility] [address],  °[place], [place] 1-800-853-5163 or 336-641-4981   °Substance Abuse Resources °Organization         Address  Phone  Notes  °[facility]  336-882-2125   °[facility]  336-784-9470   °[facility]  336-285-9073   °[facility]  336-845-3988   °Residential & Outpatient Substance Abuse Program  1-800-659-3381   °Psychological Services °Organization         Address  Phone  Notes  ° Health  336- 832-9600   °[facility]  336- 378-7881   °[facility] [address], [place] 1-800-853-5163 or 336-641-4981   ° °Mobile Crisis Teams °Organization         Address  Phone  Notes  °[facility], Mobile Crisis Care Unit  1-877-626-1772   °Assertive °[facility] ° [address]  [place], [place] 336-834-9664   °Sharon DeEsch [address] °[place] 336-554-5454   ° °Self-Help/Support Groups °Organization         Address  Phone             Notes  °[facility] - variety of support groups  336- 373-1402 Call for more information  °Narcotics Anonymous (NA), [facility] [address], °[place]   2 meetings at this location  ° °  Residential Treatment Programs Organization         Address  Phone  Notes  [facility] [address],    [place]  1-403-468-0514   [facility]  [address] [place] [address], [place], [place]   [place], [place] [phone] Admissions: 8am-3pm M-F  [facility] [place] [address]    [place], [place] 801-655-3748   [facility] [address] [place], [place], [place]   [facility] [address]  [place], [place]   [facility] - Intensive Outpatient [address], [place], [place]   [facility] ([place].,  [place], [place] 1-450-068-4543 or 917-573-4714   [facility] [address], [place], [place] Accepts Medicaid  [facility] [place] [address]  [place] 1-617-017-9176 Substance Abuse/Addiction Treatment   [facility]         Address  Phone  Notes  [facility]  (743)886-6980   Domenic Schwab, PhD [address] [place], [place]   (813) 855-3380 or 561-629-9939   [place], [place] 732-765-4671   [facility] [address] [place], [place] [phone] Insurance/Medicaid/sponsorship through [facility] [address] [place]                                    [place], [place] 204-305-0622 [place] [address], [place] [phone]    Dr. Adele Schilder  [phone]   [facility] Dept. 1) [address] [place] 2) [place] 3)  [place] [address], [place] [phone]  (435) [phone]   [place] (217) 807-6080 or [phone] (After Hours)      Take your usual prescriptions as previously directed.  Apply moist heat or ice to the area(s) of discomfort, for 15 minutes at a time, several times per day for the next few days.  Do not fall asleep on a heating or ice pack.  Call your regular medical doctor and the Urologist tomorrow to schedule a follow up appointment this week.  Return to the Emergency Department immediately if worsening.

## 2015-01-02 NOTE — ED Notes (Signed)
Pt states that he has been having right side flank pain for the past few days.

## 2015-01-30 ENCOUNTER — Emergency Department (HOSPITAL_COMMUNITY): Payer: Medicare Other

## 2015-01-30 ENCOUNTER — Encounter (HOSPITAL_COMMUNITY): Payer: Self-pay | Admitting: *Deleted

## 2015-01-30 ENCOUNTER — Emergency Department (HOSPITAL_COMMUNITY)
Admission: EM | Admit: 2015-01-30 | Discharge: 2015-01-30 | Disposition: A | Discharge status: Home / Self Care | Payer: Medicare Other | Attending: Emergency Medicine | Admitting: Emergency Medicine

## 2015-01-30 DIAGNOSIS — Y9389 Activity, other specified: Secondary | ICD-10-CM | POA: Insufficient documentation

## 2015-01-30 DIAGNOSIS — Z7289 Other problems related to lifestyle: Secondary | ICD-10-CM | POA: Diagnosis not present

## 2015-01-30 DIAGNOSIS — F419 Anxiety disorder, unspecified: Secondary | ICD-10-CM | POA: Diagnosis not present

## 2015-01-30 DIAGNOSIS — Y9289 Other specified places as the place of occurrence of the external cause: Secondary | ICD-10-CM | POA: Insufficient documentation

## 2015-01-30 DIAGNOSIS — W01198A Fall on same level from slipping, tripping and stumbling with subsequent striking against other object, initial encounter: Secondary | ICD-10-CM | POA: Diagnosis not present

## 2015-01-30 DIAGNOSIS — I10 Essential (primary) hypertension: Secondary | ICD-10-CM | POA: Diagnosis not present

## 2015-01-30 DIAGNOSIS — Z8739 Personal history of other diseases of the musculoskeletal system and connective tissue: Secondary | ICD-10-CM | POA: Diagnosis not present

## 2015-01-30 DIAGNOSIS — R739 Hyperglycemia, unspecified: Secondary | ICD-10-CM

## 2015-01-30 DIAGNOSIS — K219 Gastro-esophageal reflux disease without esophagitis: Secondary | ICD-10-CM | POA: Diagnosis not present

## 2015-01-30 DIAGNOSIS — G8929 Other chronic pain: Secondary | ICD-10-CM | POA: Insufficient documentation

## 2015-01-30 DIAGNOSIS — Y998 Other external cause status: Secondary | ICD-10-CM | POA: Insufficient documentation

## 2015-01-30 DIAGNOSIS — Z794 Long term (current) use of insulin: Secondary | ICD-10-CM | POA: Diagnosis not present

## 2015-01-30 DIAGNOSIS — Z87438 Personal history of other diseases of male genital organs: Secondary | ICD-10-CM | POA: Diagnosis not present

## 2015-01-30 DIAGNOSIS — S233XXA Sprain of ligaments of thoracic spine, initial encounter: Secondary | ICD-10-CM | POA: Insufficient documentation

## 2015-01-30 DIAGNOSIS — E1165 Type 2 diabetes mellitus with hyperglycemia: Secondary | ICD-10-CM | POA: Insufficient documentation

## 2015-01-30 DIAGNOSIS — Z872 Personal history of diseases of the skin and subcutaneous tissue: Secondary | ICD-10-CM | POA: Insufficient documentation

## 2015-01-30 DIAGNOSIS — Z791 Long term (current) use of non-steroidal anti-inflammatories (NSAID): Secondary | ICD-10-CM | POA: Insufficient documentation

## 2015-01-30 DIAGNOSIS — F329 Major depressive disorder, single episode, unspecified: Secondary | ICD-10-CM | POA: Insufficient documentation

## 2015-01-30 DIAGNOSIS — S29092A Other injury of muscle and tendon of back wall of thorax, initial encounter: Secondary | ICD-10-CM | POA: Diagnosis present

## 2015-01-30 DIAGNOSIS — R52 Pain, unspecified: Secondary | ICD-10-CM

## 2015-01-30 DIAGNOSIS — S239XXA Sprain of unspecified parts of thorax, initial encounter: Secondary | ICD-10-CM

## 2015-01-30 DIAGNOSIS — Z79899 Other long term (current) drug therapy: Secondary | ICD-10-CM | POA: Diagnosis not present

## 2015-01-30 LAB — CBG MONITORING, ED: GLUCOSE-CAPILLARY: 432 mg/dL — AB (ref 65–99)

## 2015-01-30 MED ORDER — IBUPROFEN 800 MG PO TABS
800.0000 mg | ORAL_TABLET | Freq: Once | ORAL | Status: AC
  Administered 2015-01-30: 800 mg via ORAL
  Filled 2015-01-30: qty 1

## 2015-01-30 NOTE — ED Notes (Signed)
Pt c/o mid back pain; pt states he fell and hit his back on a chair and is having severe back pain; pt states he has cages in his back and wants to be sure nothing is wrong

## 2015-01-30 NOTE — Discharge Instructions (Signed)

## 2015-01-30 NOTE — ED Notes (Signed)
Per previous nurse pt refused any insulin and MD was made aware and instructed pt to administer at home at ED discharge.

## 2015-01-30 NOTE — ED Provider Notes (Signed)
CSN: [id number]     Arrival date & time 01/30/15  0527 History   First MD Initiated Contact with Patient 01/30/15 [phone]     Chief Complaint  Patient presents with  . Fall    Patient is a 39 y.o. male presenting with fall. The history is provided by the patient.  Fall This is a new problem. The current episode started yesterday. The problem occurs constantly. The problem has been gradually worsening. Pertinent negatives include no chest pain and no abdominal pain. The symptoms are aggravated by walking. The symptoms are relieved by rest.  pt reports he tripped on a shoe yesterday and fell back landing on his mid back No loc No head injury He reports pain worsened over past 24 hours No new leg weakness No incontinence is reported   Past Medical History  Diagnosis Date  . GERD (gastroesophageal reflux disease)   . Diabetes mellitus   . HTN (hypertension)   . Depression   . Anxiety   . Chronic pain   . ED (erectile dysfunction)   . Hyperlipidemia   . Diabetic foot ulcer   . Cellulitis of face   . Chronic neck and back pain   . Cervical radiculopathy   . Lumbar radiculopathy   . Drug-seeking behavior    Past Surgical History  Procedure Laterality Date  . Knee surgery    . Back surgery    . Cholecystectomy  2009  . Tonsillectomy    . Cholecystectomy open    . Wound debridement Left 10/05/2013    Procedure: INCISION AND DEBRIDEMENT LEFT FOOT;  Surgeon: Jamesetta So, MD;  Location: [facility];  Service: General;  Laterality: Left;   Family History  Problem Relation Age of Onset  . Coronary artery disease Father   . Heart attack Father   . Hypertension Mother    History  Substance Use Topics  . Smoking status: Never Smoker   . Smokeless tobacco: Current User    Types: Snuff     Comment: dip x 20  . Alcohol Use: Yes     Comment: very little 4-5 beers/yr    Review of Systems  Cardiovascular: Negative for chest pain.  Gastrointestinal: Negative for abdominal pain.   Musculoskeletal: Positive for back pain. Negative for neck pain.  Neurological: Negative for weakness.      Allergies  Levaquin  Home Medications   Prior to Admission medications   Medication Sig Start Date End Date Taking? Authorizing Provider  alprazolam Duanne Moron) 2 MG tablet Take 1 tablet (2 mg total) by mouth at bedtime as needed for sleep. 10/17/14   Radene Gunning, NP  amLODipine (NORVASC) 10 MG tablet Take 0.5 tablets (5 mg total) by mouth daily. 10/17/14   Radene Gunning, NP  cyclobenzaprine (FLEXERIL) 5 MG tablet Take 1 tablet (5 mg total) by mouth 3 (three) times daily as needed for muscle spasms. Patient not taking: Reported on 10/19/2014 10/17/14   Radene Gunning, NP  doxycycline (VIBRA-TABS) 100 MG tablet Take 1 tablet (100 mg total) by mouth every 12 (twelve) hours. 10/17/14   Radene Gunning, NP  escitalopram (LEXAPRO) 20 MG tablet Take 1 tablet (20 mg total) by mouth daily. 10/17/14   Radene Gunning, NP  esomeprazole (NEXIUM) 40 MG capsule Take 1 capsule (40 mg total) by mouth daily before breakfast. 03/30/13   Julianne Rice, MD  insulin aspart (NOVOLOG) 100 UNIT/ML FlexPen Inject 5 Units into the skin 3 (three) times daily with meals.  09/15/14   Kathie Dike, MD  insulin glargine (LANTUS) 100 UNIT/ML injection Inject 0.6 mLs (60 Units total) into the skin daily before supper. 10/17/14   Radene Gunning, NP  lisinopril (PRINIVIL,ZESTRIL) 20 MG tablet Take 1 tablet (20 mg total) by mouth daily. 10/17/14   Radene Gunning, NP  naproxen (NAPROSYN) 500 MG tablet Take 1 tablet (500 mg total) by mouth 2 (two) times daily with a meal. 11/15/14   Tammy Triplett, PA-C  oxyCODONE (ROXICODONE) 15 MG immediate release tablet Take 1 tablet (15 mg total) by mouth every 4 (four) hours as needed for pain. 10/17/14   Radene Gunning, NP   BP 184/94 mmHg  Pulse 92  Temp(Src) 98.1 F (36.7 C) (Oral)  Resp 18  Ht 6\' 1"  (1.854 m)  Wt 240 lb (108.863 kg)  BMI 31.67 kg/m2  SpO2 99% Physical  Exam CONSTITUTIONAL: Well developed/well nourished HEAD: Normocephalic/atraumatic ENMT: Mucous membranes moist NECK: supple no meningeal signs SPINE/BACK:thoracic tenderness to palpation.  No other new spinal tenderness. No bruising/crepitance/stepoffs noted to spine CV: S1/S2 noted, no murmurs/rubs/gallops noted LUNGS: Lungs are clear to auscultation bilaterally, no apparent distress ABDOMEN: soft, nontender, no rebound or guarding NEURO: Awake/alert, equal motor 5/5 strength noted with the following: hip flexion/knee flexion/extension, foot dorsi/plantar flexion, great toe extension intact bilaterally, no clonus bilaterally, no sensory deficit in any dermatome.  EXTREMITIES: pulses normal, full ROM SKIN: warm, color normal PSYCH: no abnormalities of mood noted, alert and oriented to situation    ED Course  Procedures  Labs Review Labs Reviewed  CBG MONITORING, ED - Abnormal; Notable for the following:    Glucose-Capillary 432 (*)    All other components within normal limits    Imaging Review Dg Thoracic Spine 4v  01/30/2015   CLINICAL DATA:  Acute onset of mid back pain.  Initial encounter.  EXAM: THORACIC SPINE - 4+ VIEW  COMPARISON:  Chest radiograph performed 01/21/2015  FINDINGS: There is no evidence of fracture or subluxation. Mild right convex thoracic scoliosis is noted. Vertebral bodies demonstrate normal height and alignment. Intervertebral disc spaces are preserved.  The visualized portions of both lungs are clear. The mediastinum is unremarkable in appearance. Clips are noted within the right upper quadrant, reflecting prior cholecystectomy.  IMPRESSION: No evidence of fracture or subluxation along the thoracic spine. Mild right convex thoracic scoliosis noted.   Electronically Signed   By: Garald Balding M.D.   On: 01/30/2015 06:18    Imaging negative Pt is well appearing No further imaging necessary For his hyperglycemia he denies any vomiting He would prefer to go  home and take his home insulin    MDM   Final diagnoses:  Pain  Thoracic sprain, initial encounter  Hyperglycemia    Nursing notes including past medical history and social history reviewed and considered in documentation xrays/imaging reviewed by myself and considered during evaluation Narcotic database reviewed and considered in decision making     Ripley Fraise, MD 01/30/15 [phone]

## 2015-01-30 NOTE — ED Notes (Signed)
   01/30/15 0537  Musculoskeletal  Musculoskeletal (WDL) X  RUE Full movement  LUE Full movement  RLE Full movement  LLE Full movement  Range of Motion Active  pt present w/ mid back pain after a fall yesterday. No bruising or abrasions noted

## 2015-04-10 DIAGNOSIS — I10 Essential (primary) hypertension: Secondary | ICD-10-CM | POA: Insufficient documentation

## 2015-04-10 DIAGNOSIS — W228XXA Striking against or struck by other objects, initial encounter: Secondary | ICD-10-CM | POA: Diagnosis not present

## 2015-04-10 DIAGNOSIS — Y9289 Other specified places as the place of occurrence of the external cause: Secondary | ICD-10-CM | POA: Diagnosis not present

## 2015-04-10 DIAGNOSIS — Y998 Other external cause status: Secondary | ICD-10-CM | POA: Diagnosis not present

## 2015-04-10 DIAGNOSIS — K219 Gastro-esophageal reflux disease without esophagitis: Secondary | ICD-10-CM | POA: Diagnosis not present

## 2015-04-10 DIAGNOSIS — F419 Anxiety disorder, unspecified: Secondary | ICD-10-CM | POA: Insufficient documentation

## 2015-04-10 DIAGNOSIS — Z79899 Other long term (current) drug therapy: Secondary | ICD-10-CM | POA: Diagnosis not present

## 2015-04-10 DIAGNOSIS — F329 Major depressive disorder, single episode, unspecified: Secondary | ICD-10-CM | POA: Insufficient documentation

## 2015-04-10 DIAGNOSIS — Z794 Long term (current) use of insulin: Secondary | ICD-10-CM | POA: Diagnosis not present

## 2015-04-10 DIAGNOSIS — Z872 Personal history of diseases of the skin and subcutaneous tissue: Secondary | ICD-10-CM | POA: Diagnosis not present

## 2015-04-10 DIAGNOSIS — G8929 Other chronic pain: Secondary | ICD-10-CM | POA: Insufficient documentation

## 2015-04-10 DIAGNOSIS — S99921A Unspecified injury of right foot, initial encounter: Secondary | ICD-10-CM | POA: Diagnosis present

## 2015-04-10 DIAGNOSIS — Y9301 Activity, walking, marching and hiking: Secondary | ICD-10-CM | POA: Insufficient documentation

## 2015-04-10 DIAGNOSIS — Z8739 Personal history of other diseases of the musculoskeletal system and connective tissue: Secondary | ICD-10-CM | POA: Insufficient documentation

## 2015-04-10 DIAGNOSIS — E119 Type 2 diabetes mellitus without complications: Secondary | ICD-10-CM | POA: Diagnosis not present

## 2015-04-10 DIAGNOSIS — Z87438 Personal history of other diseases of male genital organs: Secondary | ICD-10-CM | POA: Insufficient documentation

## 2015-04-10 DIAGNOSIS — S91201A Unspecified open wound of right great toe with damage to nail, initial encounter: Secondary | ICD-10-CM | POA: Diagnosis not present

## 2015-04-11 ENCOUNTER — Encounter (HOSPITAL_COMMUNITY): Payer: Self-pay | Admitting: Emergency Medicine

## 2015-04-11 ENCOUNTER — Emergency Department (HOSPITAL_COMMUNITY): Payer: Medicare Other

## 2015-04-11 ENCOUNTER — Emergency Department (HOSPITAL_COMMUNITY)
Admission: EM | Admit: 2015-04-11 | Discharge: 2015-04-11 | Disposition: A | Discharge status: Home / Self Care | Payer: Medicare Other | Attending: Emergency Medicine | Admitting: Emergency Medicine

## 2015-04-11 DIAGNOSIS — S91209A Unspecified open wound of unspecified toe(s) with damage to nail, initial encounter: Secondary | ICD-10-CM

## 2015-04-11 LAB — CBG MONITORING, ED: Glucose-Capillary: 380 mg/dL — ABNORMAL HIGH (ref 65–99)

## 2015-04-11 MED ORDER — SULFAMETHOXAZOLE-TRIMETHOPRIM 800-160 MG PO TABS: 1.0000 | ORAL_TABLET | Freq: Two times a day (BID) | ORAL | Status: AC

## 2015-04-11 MED ORDER — NAPROXEN 250 MG PO TABS
500.0000 mg | ORAL_TABLET | Freq: Once | ORAL | Status: AC
  Administered 2015-04-11: 500 mg via ORAL
  Filled 2015-04-11: qty 2

## 2015-04-11 MED ORDER — NAPROXEN 500 MG PO TABS: ORAL_TABLET | ORAL | Status: AC

## 2015-04-11 MED ORDER — CEPHALEXIN 500 MG PO CAPS: 500.0000 mg | ORAL_CAPSULE | Freq: Three times a day (TID) | ORAL | Status: AC

## 2015-04-11 NOTE — Discharge Instructions (Signed)
TAKE YOUR INSULIN WHEN YOU GET HOME. Take the antibiotics as prescribed.  Recheck if you get worse, such as fever, increasing redness, swelling.    Fingernail or Toenail Removal, Care After Refer to this sheet in the next few weeks. These instructions provide you with information about caring for yourself after your procedure. Your health care provider may also give you more specific instructions. Your treatment has been planned according to current medical practices, but problems sometimes occur. Call your health care provider if you have any problems or questions after your procedure. WHAT TO EXPECT AFTER THE PROCEDURE After your procedure, it is common to have:  Redness.  Swelling. HOME CARE INSTRUCTIONS  If you have a splint on your finger:  Wear it as directed by your health care provider. Remove it only as directed by your health care provider.  Loosen the splint if your fingers become numb and tingle, or if they turn cold and blue.  If you were given a surgical shoe, wear it as directed by your health care provider.  Take medicines only as directed by your health care provider.  Elevate your hand or foot as much of the time as possible. This helps with pain and swelling.  If you are recovering from fingernail removal, keep your hand raised above the level of your heart.  If you are recovering from toenail removal, lie on a bed or a couch with your leg propped up on pillows, or sit in a reclining chair with the footrest up.  Follow instructions from your health care provider about bandage (dressing) changes and removal:  Change your dressing 24 hours after your procedure or as directed by your health care provider.  Soak your hand or foot in warm, soapy water for 10-20 minutes or as directed by your health care provider. Do this 3 times per day or as directed by your health care provider. This reduces pain and swelling.  After you soak your hand or foot, apply a clean, dry  dressing.  Keep your dressing clean and dry. Change your dressing whenever it gets wet or dirty.  Keep all follow-up visits as directed by your health care provider. This is important. SEEK MEDICAL CARE IF:  You have increased redness or pain at your nail area.  You have increased fluid, blood, or pus coming from your nail area.  There is a bad smell coming from the dressing.  You have a fever.  Your swelling gets worse, or you have swelling that spreads from your finger to your hand or from your toe to your foot.  You have worsening redness that spreads from your finger to your hand or from your toe up to your foot.  Your finger or toe looks blue or black.   This information is not intended to replace advice given to you by your health care provider. Make sure you discuss any questions you have with your health care provider.   Document Released: 07/01/2014 Document Reviewed: 07/01/2014 Elsevier Interactive Patient Education [employer].

## 2015-04-11 NOTE — ED Notes (Signed)
Pt states understanding of care given and follow up instructions 

## 2015-04-11 NOTE — ED Notes (Signed)
Toe nail missing from left great toe

## 2015-04-11 NOTE — ED Provider Notes (Signed)
CSN: [id number]     Arrival date & time 04/10/15  2352 History   First MD Initiated Contact with Patient 04/11/15 0210   Chief Complaint  Patient presents with  . Toe Pain     (Consider location/radiation/quality/duration/timing/severity/associated sxs/prior Treatment) HPI patient is here with his significant other who is also a patient in the ED. Patient has diabetes which he poorly controls. He states he was walking in the dark on October 14 and he stubbed his right great toe on the bed frame. He states his toenail fell off later that day. He states this afternoon he started getting redness around the nail bed which made him get concerned. He states he's having a throbbing pain that radiates up the front of his leg. He denies any known fever or chills. He has had mild nausea. Patient states he has had infections in his foot before and they have discussed amputating his little toe on the same foot however they did not do that. He states it has had some bleeding but denies any purulent drainage. After reviewing his CBG patient states he did not take any of his insulin this evening.  PCP Dr Derrek Monaco in [place], [place]  Past Medical History  Diagnosis Date  . GERD (gastroesophageal reflux disease)   . Diabetes mellitus   . HTN (hypertension)   . Depression   . Anxiety   . Chronic pain   . ED (erectile dysfunction)   . Hyperlipidemia   . Diabetic foot ulcer ([place])   . Cellulitis of face   . Chronic neck and back pain   . Cervical radiculopathy   . Lumbar radiculopathy   . Drug-seeking behavior    Past Surgical History  Procedure Laterality Date  . Knee surgery    . Back surgery    . Cholecystectomy  2009  . Tonsillectomy    . Cholecystectomy open    . Wound debridement Left 10/05/2013    Procedure: INCISION AND DEBRIDEMENT LEFT FOOT;  Surgeon: Jamesetta So, MD;  Location: [facility];  Service: General;  Laterality: Left;   Family History  Problem Relation Age of Onset  .  Coronary artery disease Father   . Heart attack Father   . Hypertension Mother    Social History  Substance Use Topics  . Smoking status: Never Smoker   . Smokeless tobacco: Current User    Types: Snuff     Comment: dip x 20  . Alcohol Use: Yes     Comment: very little 4-5 beers/yr    Review of Systems  All other systems reviewed and are negative.     Allergies  Levaquin  Home Medications   Prior to Admission medications   Medication Sig Start Date End Date Taking? Authorizing Provider  alprazolam Duanne Moron) 2 MG tablet Take 1 tablet (2 mg total) by mouth at bedtime as needed for sleep. 10/17/14   Radene Gunning, NP  amLODipine (NORVASC) 10 MG tablet Take 0.5 tablets (5 mg total) by mouth daily. 10/17/14   Radene Gunning, NP  cephALEXin (KEFLEX) 500 MG capsule Take 1 capsule (500 mg total) by mouth 3 (three) times daily. 04/11/15   Rolland Porter, MD  cyclobenzaprine (FLEXERIL) 5 MG tablet Take 1 tablet (5 mg total) by mouth 3 (three) times daily as needed for muscle spasms. Patient not taking: Reported on 10/19/2014 10/17/14   Radene Gunning, NP  doxycycline (VIBRA-TABS) 100 MG tablet Take 1 tablet (100 mg total) by mouth every 12 (twelve)  hours. 10/17/14   Radene Gunning, NP  escitalopram (LEXAPRO) 20 MG tablet Take 1 tablet (20 mg total) by mouth daily. 10/17/14   Radene Gunning, NP  esomeprazole (NEXIUM) 40 MG capsule Take 1 capsule (40 mg total) by mouth daily before breakfast. 03/30/13   Julianne Rice, MD  insulin aspart (NOVOLOG) 100 UNIT/ML FlexPen Inject 5 Units into the skin 3 (three) times daily with meals. 09/15/14   Kathie Dike, MD  insulin glargine (LANTUS) 100 UNIT/ML injection Inject 0.6 mLs (60 Units total) into the skin daily before supper. 10/17/14   Radene Gunning, NP  lisinopril (PRINIVIL,ZESTRIL) 20 MG tablet Take 1 tablet (20 mg total) by mouth daily. 10/17/14   Radene Gunning, NP  naproxen (NAPROSYN) 500 MG tablet Take 1 po BID with food prn pain 04/11/15   Rolland Porter, MD    oxyCODONE (ROXICODONE) 15 MG immediate release tablet Take 1 tablet (15 mg total) by mouth every 4 (four) hours as needed for pain. 10/17/14   Radene Gunning, NP  sulfamethoxazole-trimethoprim (BACTRIM DS,SEPTRA DS) 800-160 MG tablet Take 1 tablet by mouth 2 (two) times daily. 04/11/15   Rolland Porter, MD   BP 159/102 mmHg  Pulse 105  Temp(Src) 98 F (36.7 C)  Resp 20  Ht 6\' 1"  (1.854 m)  Wt 225 lb (102.059 kg)  BMI 29.69 kg/m2  SpO2 100% Physical Exam  Constitutional: He is oriented to person, place, and time. He appears well-developed and well-nourished.  Non-toxic appearance. He does not appear ill. No distress.  HENT:  Head: Normocephalic and atraumatic.  Right Ear: External ear normal.  Left Ear: External ear normal.  Nose: Nose normal. No mucosal edema or rhinorrhea.  Mouth/Throat: Mucous membranes are normal. No dental abscesses or uvula swelling.  Eyes: Conjunctivae and EOM are normal.  Neck: Normal range of motion and full passive range of motion without pain.  Pulmonary/Chest: Effort normal. No respiratory distress. He has no rhonchi. He exhibits no crepitus.  Abdominal: Soft. Normal appearance and bowel sounds are normal.  Musculoskeletal: Normal range of motion. He exhibits tenderness. He exhibits no edema.  Moves all extremities well. He is noted to have a avulsion of these right great toenail. There is some dried blood along the lateral aspect of the nail bed. He has some mild redness and swelling on the toe just proximal to where the nail bed was located. There is no redness of the toe, swelling, or red streaks seen. His skin is normal temperature. Please see photo.  Neurological: He is alert and oriented to person, place, and time. He has normal strength. No cranial nerve deficit.  Skin: Skin is warm, dry and intact. No rash noted. No erythema. No pallor.  Psychiatric: He has a normal mood and affect. His speech is normal and behavior is normal. His mood appears not anxious.   Nursing note and vitals reviewed.      ED Course  Procedures (including critical care time) Medications  naproxen (NAPROSYN) tablet 500 mg (500 mg Oral Given 04/11/15 0237)    I reviewed patient's prior lab work. In July his BUN was 11 and his creatinine was 0.92. He was started on nonsteroidal anti-inflammatory medication for pain. Patient has history of drug-seeking behavior.   Labs Review Labs Reviewed  CBG MONITORING, ED - Abnormal; Notable for the following:    Glucose-Capillary 380 (*)    All other components within normal limits   Laboratory interpretation all normal except hyperglycemia however patient has  not taken his insulin this evening   Imaging Review Dg Toe Great Left  04/11/2015  CLINICAL DATA:  Hit left great toe on bed, with discoloration and oozing about the nail. Initial encounter. EXAM: LEFT GREAT TOE COMPARISON:  Left foot radiographs performed 02/28/2014 FINDINGS: There is no evidence of fracture or dislocation. Soft tissue swelling is noted about the left great toe. Visualized joint spaces are preserved. No osseous erosions are seen. IMPRESSION: No evidence of fracture or dislocation. No radiopaque foreign bodies seen. Electronically Signed   By: Garald Balding M.D.   On: 04/11/2015 01:03   I have personally reviewed and evaluated these images and lab results as part of my medical decision-making.   EKG Interpretation None      MDM  patient has poorly controlled diabetes probably due to noncompliance. He has a history of infections in his feet. He was started on antibiotics.    Final diagnoses:  Nail avulsion, toe, initial encounter   Discharge Medication List as of 04/11/2015  2:31 AM    START taking these medications   Details  cephALEXin (KEFLEX) 500 MG capsule Take 1 capsule (500 mg total) by mouth 3 (three) times daily., Starting 04/11/2015, Until Discontinued, Print    sulfamethoxazole-trimethoprim (BACTRIM DS,SEPTRA DS) 800-160 MG tablet  Take 1 tablet by mouth 2 (two) times daily., Starting 04/11/2015, Until Discontinued, Print        Plan discharge  Rolland Porter, MD, Barbette Or, MD 04/11/15 [id number]

## 2015-04-11 NOTE — ED Notes (Signed)
Pt c/o left great toe pain since stumping it on bed Saturday.

## 2015-08-04 ENCOUNTER — Other Ambulatory Visit: Payer: Self-pay | Admitting: Physician Assistant

## 2015-08-04 DIAGNOSIS — M5030 Other cervical disc degeneration, unspecified cervical region: Secondary | ICD-10-CM

## 2015-08-04 DIAGNOSIS — M5412 Radiculopathy, cervical region: Secondary | ICD-10-CM

## 2015-08-08 IMAGING — DX DG LUMBAR SPINE COMPLETE 4+V
5 series · 5 of 5 positions shown · non-contrast
Comparison: 09/13/2014

CLINICAL DATA: Vomiting, severe low back pain radiating down both
legs.

EXAM:
LUMBAR SPINE - COMPLETE 4+ VIEW

[l-spine ap]
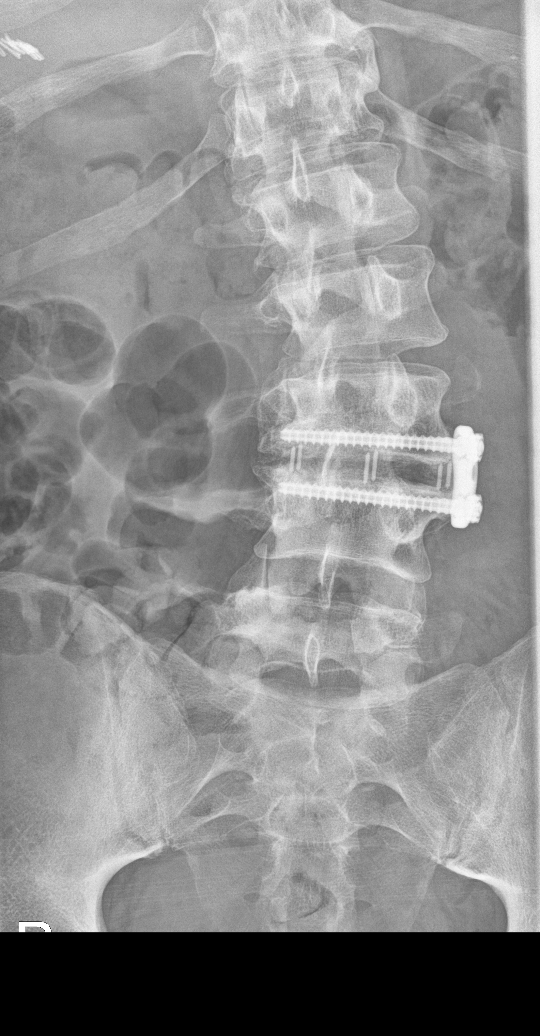

[l-spine obl (1 of 2)]
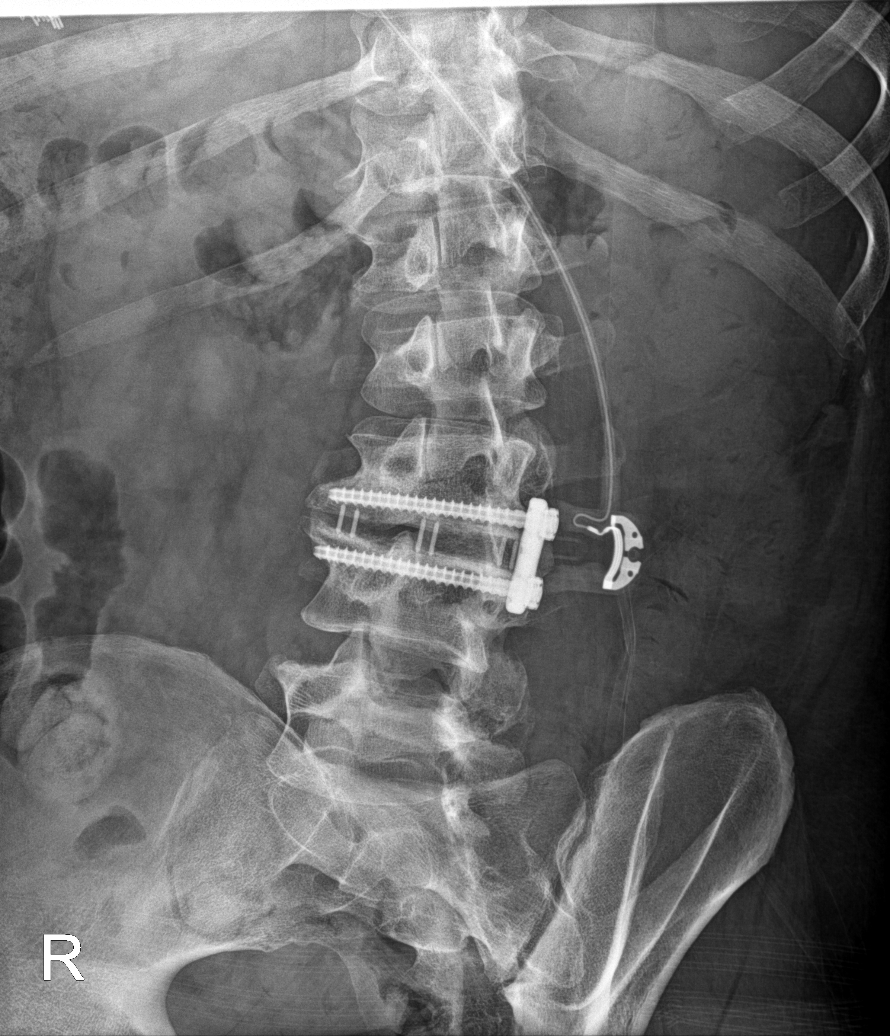

[l-spine obl (2 of 2)]
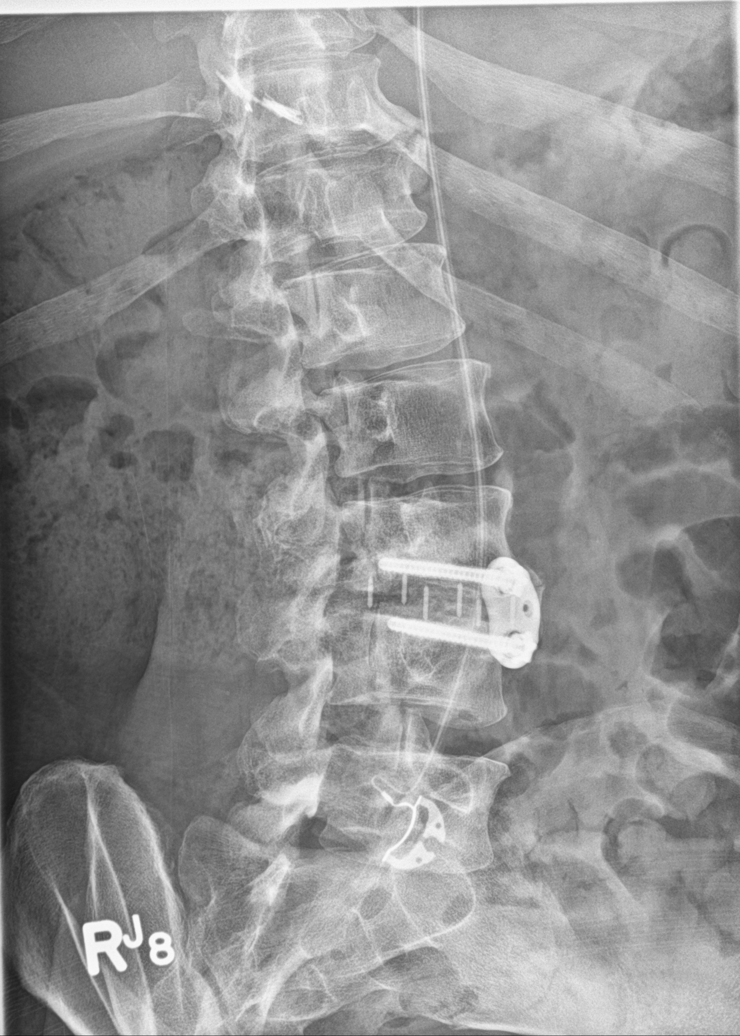

[l-spine lat]
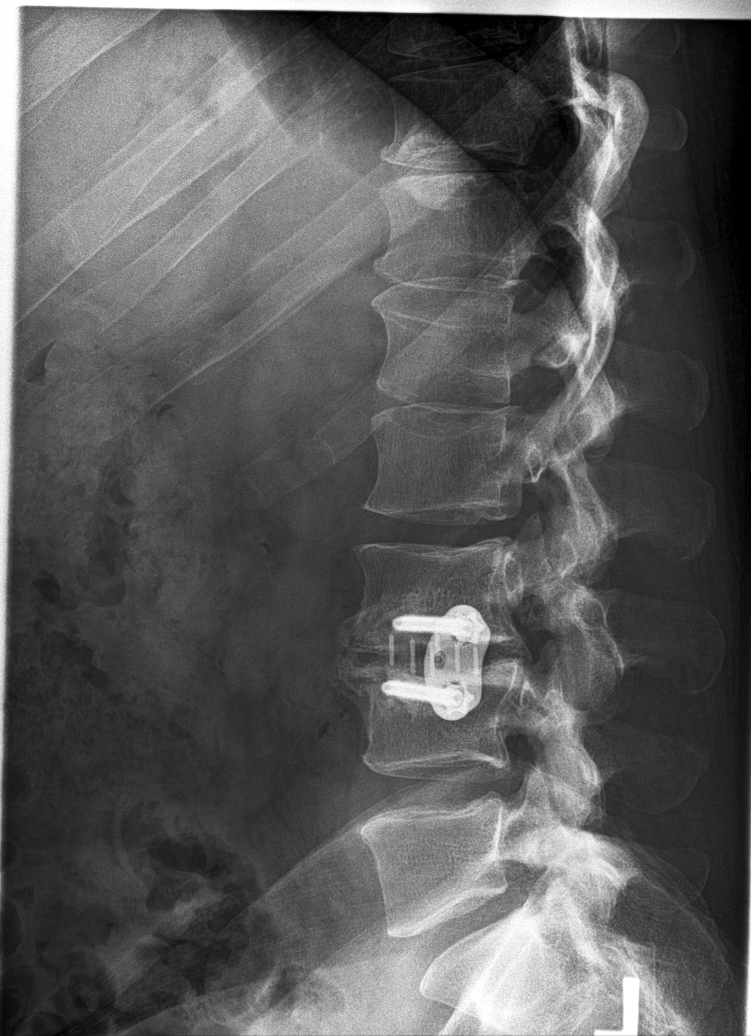

[l-spine spot]
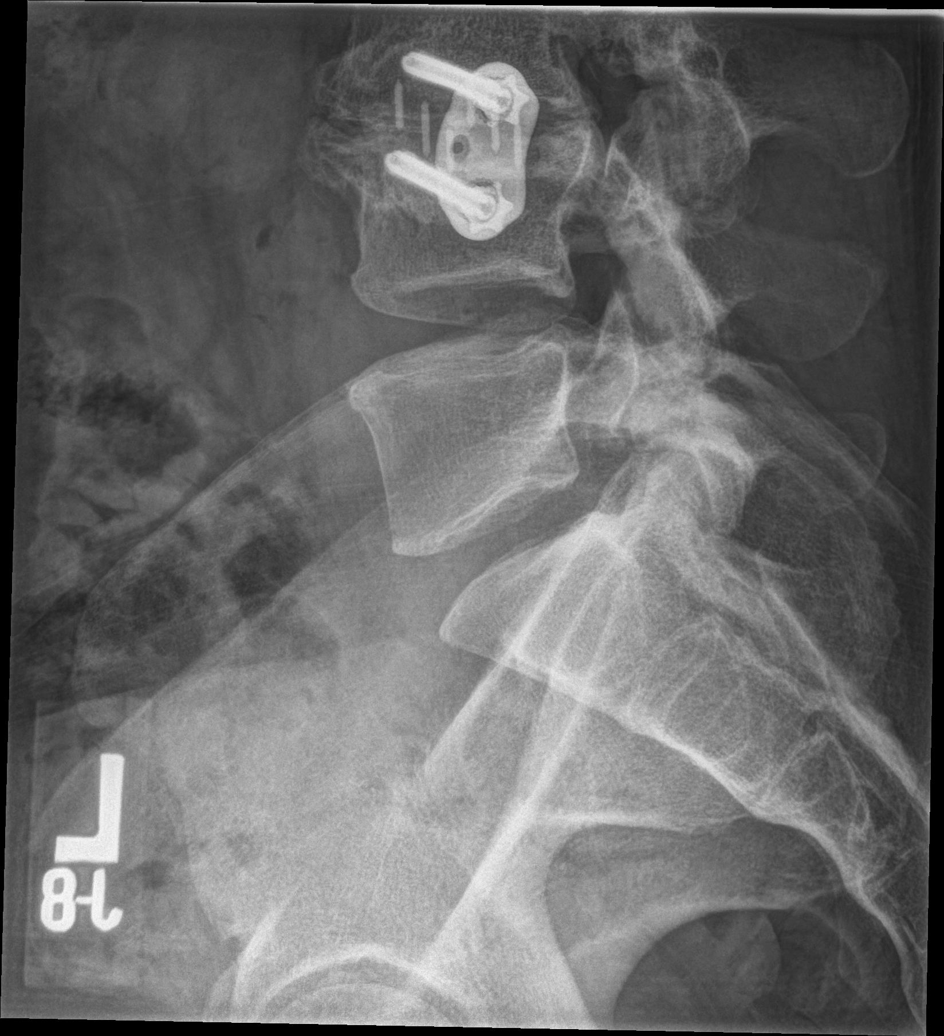

[5 of 5 positions shown; findings below may reference images not displayed]

FINDINGS: Mild leftward scoliosis. Prior lateral fusion at L3-4. No acute bony
abnormality. No fracture or subluxation. Disc spaces are maintained.
SI joints are symmetric and unremarkable.
IMPRESSION: Stable postoperative changes and levoscoliosis.  No acute findings.

## 2015-08-08 IMAGING — DX DG CHEST 2V
2 series · 2 of 2 positions shown · non-contrast
Comparison: None.

CLINICAL DATA: Back pain and nausea for 1 day.

EXAM:
CHEST  2 VIEW

[chest pa]
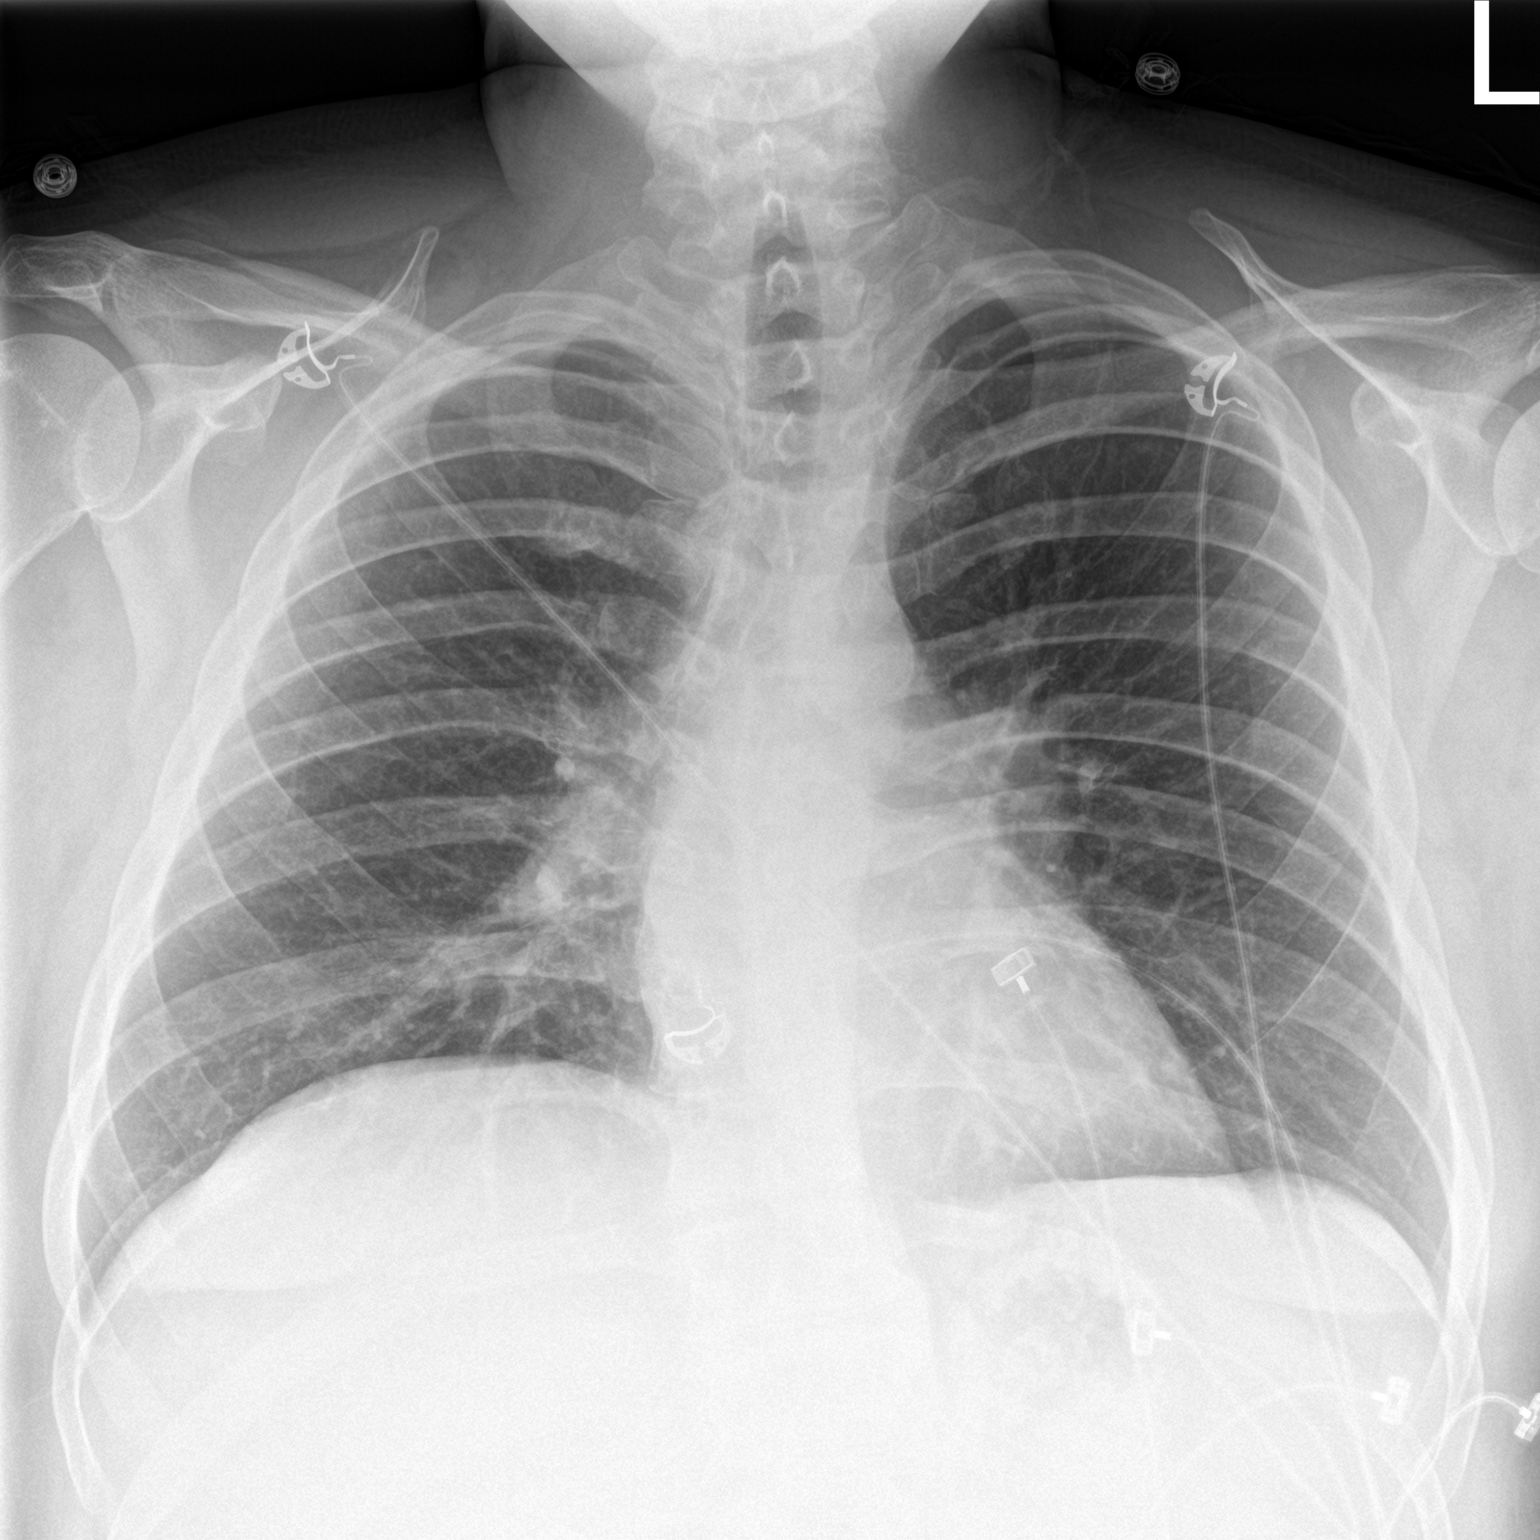

[chest lat]
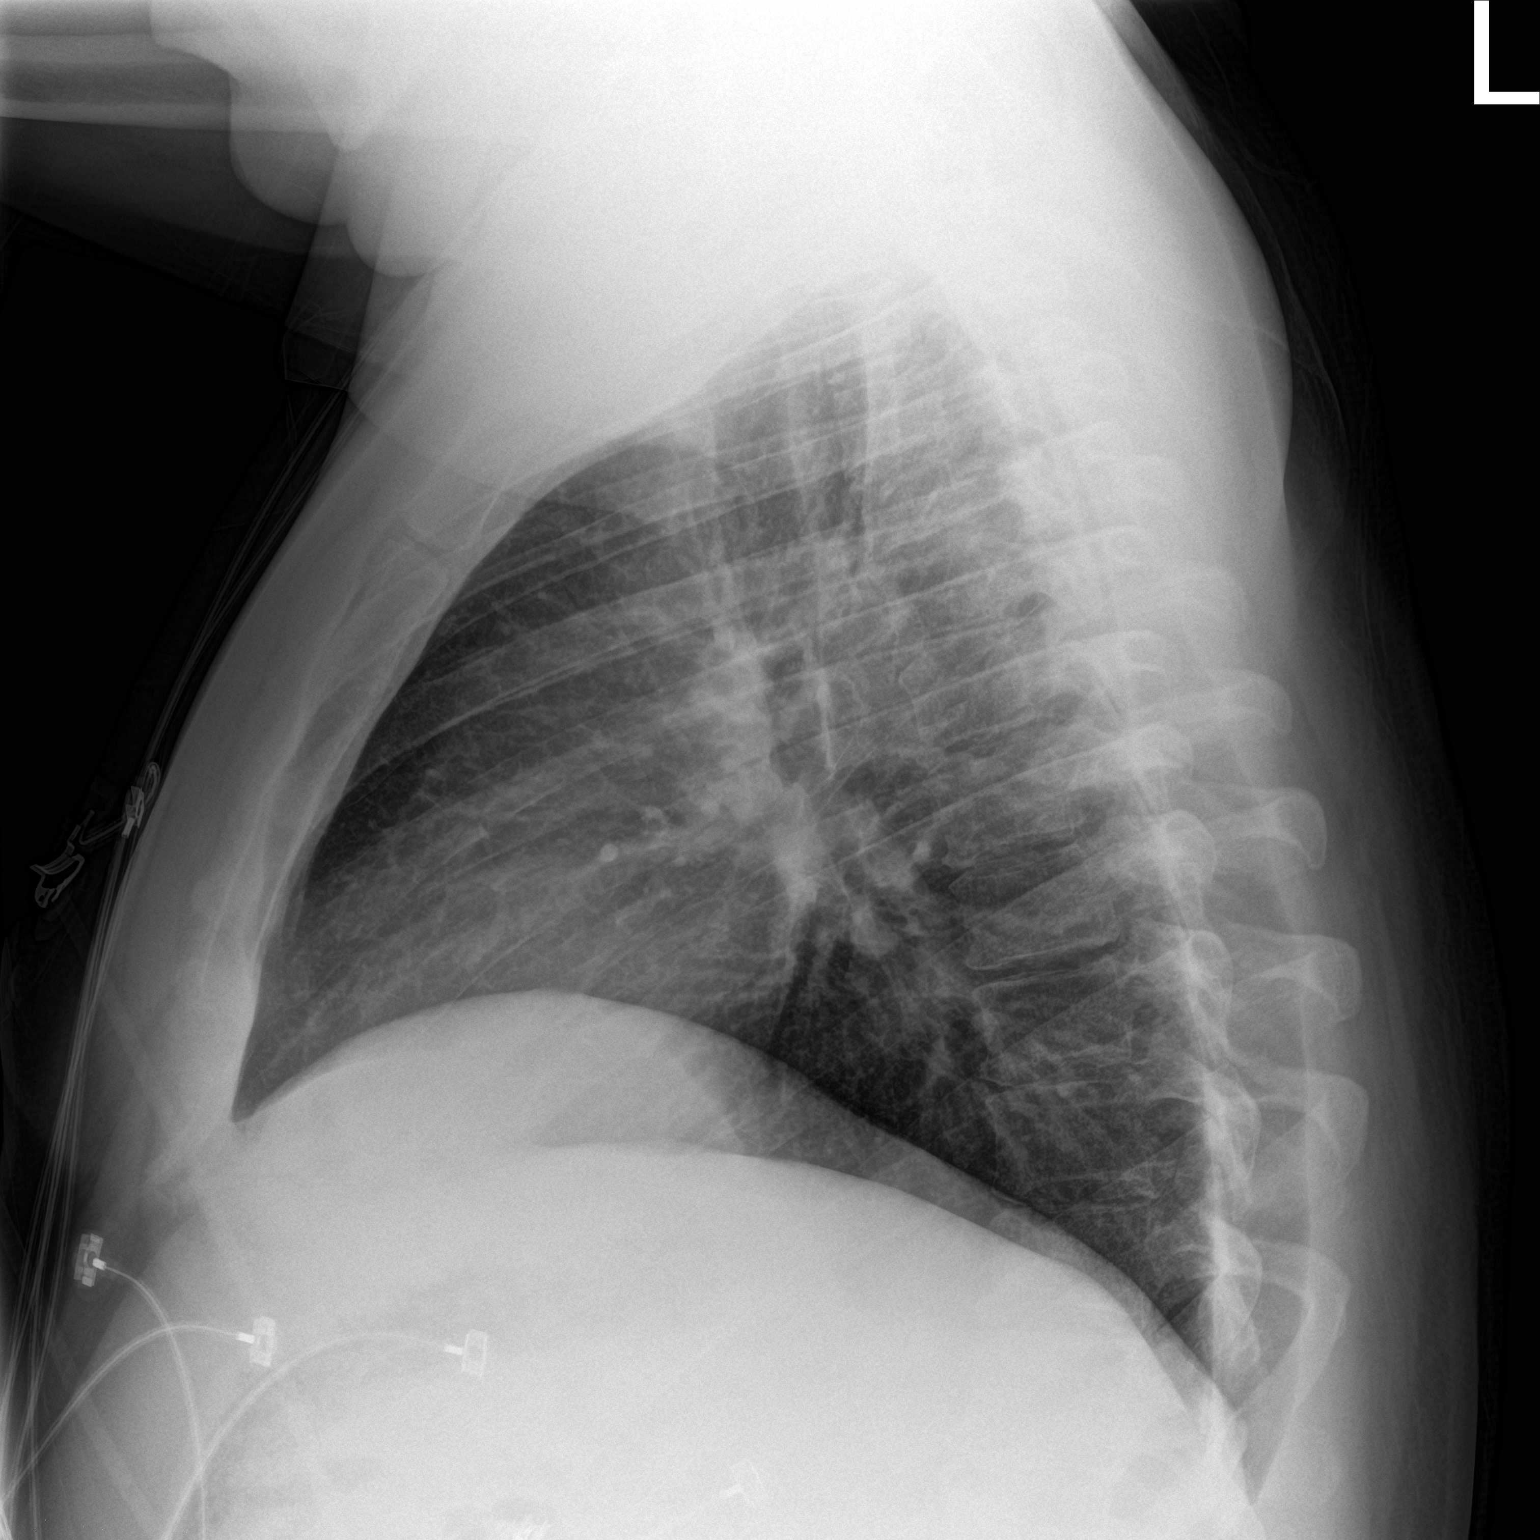

[2 of 2 positions shown; findings below may reference images not displayed]

FINDINGS: The cardiomediastinal contours are normal. Pulmonary vasculature is
normal. No consolidation, pleural effusion, or pneumothorax. Mild
broad-based rightward curvature of the thoracic spine. No acute
osseous abnormalities are seen.
IMPRESSION: No acute pulmonary process.

## 2015-08-20 ENCOUNTER — Other Ambulatory Visit: Payer: Self-pay

## 2015-08-25 ENCOUNTER — Ambulatory Visit
Admission: RE | Admit: 2015-08-25 | Discharge: 2015-08-25 | Disposition: A | Discharge status: Home / Self Care | Payer: Medicare Other | Source: Ambulatory Visit | Attending: Physician Assistant | Admitting: Physician Assistant

## 2015-08-25 DIAGNOSIS — M5030 Other cervical disc degeneration, unspecified cervical region: Secondary | ICD-10-CM

## 2015-08-25 DIAGNOSIS — M5412 Radiculopathy, cervical region: Secondary | ICD-10-CM

## 2015-08-28 ENCOUNTER — Other Ambulatory Visit: Payer: Medicare Other

## 2015-09-03 ENCOUNTER — Inpatient Hospital Stay: Admission: RE | Admit: 2015-09-03 | Payer: Medicare Other | Source: Ambulatory Visit

## 2015-09-14 ENCOUNTER — Other Ambulatory Visit: Payer: Self-pay

## 2015-09-25 ENCOUNTER — Other Ambulatory Visit: Payer: Self-pay

## 2016-03-26 ENCOUNTER — Emergency Department (HOSPITAL_COMMUNITY): Payer: Medicare Other

## 2016-03-26 ENCOUNTER — Encounter (HOSPITAL_COMMUNITY): Payer: Self-pay | Admitting: *Deleted

## 2016-03-26 ENCOUNTER — Emergency Department (HOSPITAL_COMMUNITY)
Admission: EM | Admit: 2016-03-26 | Discharge: 2016-03-26 | Disposition: A | Discharge status: Home / Self Care | Payer: Medicare Other | Attending: Emergency Medicine | Admitting: Emergency Medicine

## 2016-03-26 DIAGNOSIS — Z79899 Other long term (current) drug therapy: Secondary | ICD-10-CM | POA: Diagnosis not present

## 2016-03-26 DIAGNOSIS — M549 Dorsalgia, unspecified: Secondary | ICD-10-CM | POA: Diagnosis not present

## 2016-03-26 DIAGNOSIS — E1165 Type 2 diabetes mellitus with hyperglycemia: Secondary | ICD-10-CM | POA: Insufficient documentation

## 2016-03-26 DIAGNOSIS — Z794 Long term (current) use of insulin: Secondary | ICD-10-CM | POA: Diagnosis not present

## 2016-03-26 DIAGNOSIS — R112 Nausea with vomiting, unspecified: Secondary | ICD-10-CM

## 2016-03-26 DIAGNOSIS — F17299 Nicotine dependence, other tobacco product, with unspecified nicotine-induced disorders: Secondary | ICD-10-CM | POA: Diagnosis not present

## 2016-03-26 DIAGNOSIS — I10 Essential (primary) hypertension: Secondary | ICD-10-CM | POA: Diagnosis not present

## 2016-03-26 DIAGNOSIS — R739 Hyperglycemia, unspecified: Secondary | ICD-10-CM

## 2016-03-26 LAB — CBG MONITORING, ED
GLUCOSE-CAPILLARY: 265 mg/dL — AB (ref 65–99)
Glucose-Capillary: 347 mg/dL — ABNORMAL HIGH (ref 65–99)

## 2016-03-26 LAB — URINALYSIS, ROUTINE W REFLEX MICROSCOPIC
BILIRUBIN URINE: NEGATIVE
Glucose, UA: >1000 mg/dL — AB
Leukocytes, UA: NEGATIVE
Nitrite: NEGATIVE
PROTEIN: 30 mg/dL — AB
Specific Gravity, Urine: 1.01 (ref 1.005–1.030)
pH: 7.5 (ref 5.0–8.0)

## 2016-03-26 LAB — CBC WITH DIFFERENTIAL/PLATELET
Basophils Absolute: 0 10*3/uL (ref 0.0–0.1)
Basophils Relative: 0 %
EOS ABS: 0.1 10*3/uL (ref 0.0–0.7)
Eosinophils Relative: 1 %
HEMATOCRIT: 36.8 % — AB (ref 39.0–52.0)
HEMOGLOBIN: 12.5 g/dL — AB (ref 13.0–17.0)
Lymphocytes Relative: 21 %
Lymphs Abs: 1 10*3/uL (ref 0.7–4.0)
MCH: 28.1 pg (ref 26.0–34.0)
MCHC: 34 g/dL (ref 30.0–36.0)
MCV: 82.7 fL (ref 78.0–100.0)
MONOS PCT: 8 %
Monocytes Absolute: 0.4 10*3/uL (ref 0.1–1.0)
NEUTROS ABS: 3.1 10*3/uL (ref 1.7–7.7)
NEUTROS PCT: 70 %
Platelets: 264 10*3/uL (ref 150–400)
RBC: 4.45 MIL/uL (ref 4.22–5.81)
RDW: 13.1 % (ref 11.5–15.5)
WBC: 4.5 10*3/uL (ref 4.0–10.5)

## 2016-03-26 LAB — COMPREHENSIVE METABOLIC PANEL
ALK PHOS: 57 U/L (ref 38–126)
ALT: 12 U/L — ABNORMAL LOW (ref 17–63)
ANION GAP: 11 (ref 5–15)
AST: 13 U/L — ABNORMAL LOW (ref 15–41)
Albumin: 4.4 g/dL (ref 3.5–5.0)
BILIRUBIN TOTAL: 0.5 mg/dL (ref 0.3–1.2)
BUN: 8 mg/dL (ref 6–20)
CALCIUM: 9.3 mg/dL (ref 8.9–10.3)
CO2: 30 mmol/L (ref 22–32)
Chloride: 98 mmol/L — ABNORMAL LOW (ref 101–111)
Creatinine, Ser: 0.77 mg/dL (ref 0.61–1.24)
GFR calc non Af Amer: >60 mL/min (ref >60)
Glucose, Bld: 386 mg/dL — ABNORMAL HIGH (ref 65–99)
Potassium: 3.5 mmol/L (ref 3.5–5.1)
Sodium: 139 mmol/L (ref 135–145)
TOTAL PROTEIN: 7.6 g/dL (ref 6.5–8.1)

## 2016-03-26 LAB — TROPONIN I

## 2016-03-26 LAB — URINE MICROSCOPIC-ADD ON: Bacteria, UA: NONE SEEN

## 2016-03-26 LAB — LIPASE, BLOOD: Lipase: 17 U/L (ref 11–51)

## 2016-03-26 MED ORDER — SODIUM CHLORIDE 0.9 % IV BOLUS (SEPSIS)
1000.0000 mL | Freq: Once | INTRAVENOUS | Status: AC
Start: 2016-03-26 — End: 2016-03-26
  Administered 2016-03-26: 1000 mL via INTRAVENOUS

## 2016-03-26 MED ORDER — SODIUM CHLORIDE 0.9 % IV BOLUS (SEPSIS)
1000.0000 mL | Freq: Once | INTRAVENOUS | Status: AC
  Administered 2016-03-26 (×2): 1000 mL via INTRAVENOUS

## 2016-03-26 MED ORDER — INSULIN ASPART 100 UNIT/ML ~~LOC~~ SOLN
5.0000 [IU] | Freq: Once | SUBCUTANEOUS | Status: AC
  Administered 2016-03-26: 5 [IU] via SUBCUTANEOUS
  Filled 2016-03-26: qty 1

## 2016-03-26 MED ORDER — GI COCKTAIL ~~LOC~~
30.0000 mL | Freq: Once | ORAL | Status: AC
  Administered 2016-03-26: 30 mL via ORAL
  Filled 2016-03-26: qty 30

## 2016-03-26 MED ORDER — ONDANSETRON HCL 4 MG PO TABS: 4.0000 mg | ORAL_TABLET | Freq: Four times a day (QID) | ORAL | 0 refills | Status: AC

## 2016-03-26 MED ORDER — FAMOTIDINE IN NACL 20-0.9 MG/50ML-% IV SOLN
20.0000 mg | Freq: Once | INTRAVENOUS | Status: AC
  Administered 2016-03-26: 20 mg via INTRAVENOUS
  Filled 2016-03-26: qty 50

## 2016-03-26 MED ORDER — SODIUM CHLORIDE 0.9 % IV SOLN: Freq: Once | INTRAVENOUS | Status: DC

## 2016-03-26 MED ORDER — ONDANSETRON HCL 4 MG/2ML IJ SOLN
4.0000 mg | Freq: Once | INTRAMUSCULAR | Status: AC
  Administered 2016-03-26: 4 mg via INTRAVENOUS
  Filled 2016-03-26: qty 2

## 2016-03-26 MED ORDER — HYDROMORPHONE HCL 1 MG/ML IJ SOLN
1.0000 mg | Freq: Once | INTRAMUSCULAR | Status: AC
  Administered 2016-03-26: 1 mg via INTRAVENOUS
  Filled 2016-03-26: qty 1

## 2016-03-26 NOTE — ED Notes (Signed)
Pt consumed 100cc water and ice chips with no nausea/ vomiting

## 2016-03-26 NOTE — Discharge Instructions (Signed)
Take the nausea medication as prescribed. Continue your insulin and BP medications. Followup with your doctor. Return to the ED if you develop new or worsening symptoms.

## 2016-03-26 NOTE — ED Notes (Signed)
Patient states that he has been sick all day and not able to keep his medicines down. States that he has high blood pressure.

## 2016-03-26 NOTE — ED Notes (Signed)
edp aware of pt's current bp with no new orders

## 2016-03-26 NOTE — ED Notes (Signed)
Pt given ice chips

## 2016-03-26 NOTE — ED Triage Notes (Signed)
Pt c/o vomiting x 12 hours; pt denies any abdominal pain,diarrhea and fever; pt states he is diabetic and is unable to take any of his medications

## 2016-03-26 NOTE — ED Notes (Signed)
Pt c/o chronic back pain, states he takes percocet 10mg  tid, but he threw up 2 doses today.  EDP notified

## 2016-03-26 NOTE — ED Provider Notes (Signed)
[facility] DEPT Provider Note   CSN: [id number] Arrival date & time: 03/26/16  0353     History   Chief Complaint Chief Complaint  Patient presents with  . Emesis    HPI Christopher Raymond is a 40 y.o. male.  Patient reports 12 hour history of nausea and vomiting and not being able to keep down his medications at home. States he saw his PCP for a regular appointment and everything was okay. 2 hours later he developed vomiting. Denies any new medications or new foods. Denies any sick contacts. Denies any diarrhea. Denies any blood in his emesis. Denies any abdominal pain, chest pain, shortness of breath. Vomiting has exacerbated his chronic back pain. Denies fever. States he has a history of acid reflux and takes Nexium and occasionally has episodes of vomiting but never like this. He did not check his blood sugar at home. No cardiac history.   The history is provided by the patient.  Emesis   Pertinent negatives include no abdominal pain, no chills, no cough, no fever and no headaches.    Past Medical History:  Diagnosis Date  . Anxiety   . Cellulitis of face   . Cervical radiculopathy   . Chronic neck and back pain   . Chronic pain   . Depression   . Diabetes mellitus   . Diabetic foot ulcer ([place])   . Drug-seeking behavior   . ED (erectile dysfunction)   . GERD (gastroesophageal reflux disease)   . HTN (hypertension)   . Hyperlipidemia   . Lumbar radiculopathy     Patient Active Problem List   Diagnosis Date Noted  . Facial cellulitis   . Essential hypertension   . Esophageal reflux   . Type 2 diabetes mellitus with complication ([place])   . Bilateral low back pain without sciatica   . Pilonidal cyst 02/28/2014  . Osteomyelitis ([place]) 12/03/2013  . Acute osteomyelitis of foot ([place]) 10/06/2013  . Diabetic osteomyelitis ([place]) 10/05/2013  . Diabetic foot ulcer ([place]) 09/29/2013  . Hyponatremia 09/29/2013  . Hyperglycemia 09/29/2013  . Non-healing ulcer ([place])  06/26/2013  . Diabetic foot infection ([place]) 06/20/2013  . Essential hypertension, benign 09/18/2012  . Diabetes ([place]) 09/18/2012  . Chronic low back pain 09/18/2012  . Cellulitis 09/18/2012  . Rectal bleed 01/22/2011  . GERD (gastroesophageal reflux disease) 01/22/2011  . Vomiting 01/22/2011    Past Surgical History:  Procedure Laterality Date  . BACK SURGERY    . CHOLECYSTECTOMY  2009  . CHOLECYSTECTOMY OPEN    . KNEE SURGERY    . TONSILLECTOMY    . WOUND DEBRIDEMENT Left 10/05/2013   Procedure: INCISION AND DEBRIDEMENT LEFT FOOT;  Surgeon: Jamesetta So, MD;  Location: [facility];  Service: General;  Laterality: Left;       Home Medications    Prior to Admission medications   Medication Sig Start Date End Date Taking? Authorizing Provider  sitaGLIPtin (JANUVIA) 100 MG tablet Take 300 mg by mouth daily.   Yes Historical Provider, MD  alprazolam Duanne Moron) 2 MG tablet Take 1 tablet (2 mg total) by mouth at bedtime as needed for sleep. 10/17/14   Radene Gunning, NP  amLODipine (NORVASC) 10 MG tablet Take 0.5 tablets (5 mg total) by mouth daily. 10/17/14   Radene Gunning, NP  cephALEXin (KEFLEX) 500 MG capsule Take 1 capsule (500 mg total) by mouth 3 (three) times daily. 04/11/15   Rolland Porter, MD  cyclobenzaprine (FLEXERIL) 5 MG tablet Take 1  tablet (5 mg total) by mouth 3 (three) times daily as needed for muscle spasms. Patient not taking: Reported on 10/19/2014 10/17/14   Radene Gunning, NP  doxycycline (VIBRA-TABS) 100 MG tablet Take 1 tablet (100 mg total) by mouth every 12 (twelve) hours. 10/17/14   Radene Gunning, NP  escitalopram (LEXAPRO) 20 MG tablet Take 1 tablet (20 mg total) by mouth daily. 10/17/14   Radene Gunning, NP  esomeprazole (NEXIUM) 40 MG capsule Take 1 capsule (40 mg total) by mouth daily before breakfast. 03/30/13   Julianne Rice, MD  insulin aspart (NOVOLOG) 100 UNIT/ML FlexPen Inject 5 Units into the skin 3 (three) times daily with meals. 09/15/14   Kathie Dike, MD    insulin glargine (LANTUS) 100 UNIT/ML injection Inject 0.6 mLs (60 Units total) into the skin daily before supper. 10/17/14   Radene Gunning, NP  lisinopril (PRINIVIL,ZESTRIL) 20 MG tablet Take 1 tablet (20 mg total) by mouth daily. 10/17/14   Radene Gunning, NP  naproxen (NAPROSYN) 500 MG tablet Take 1 po BID with food prn pain 04/11/15   Rolland Porter, MD  oxyCODONE (ROXICODONE) 15 MG immediate release tablet Take 1 tablet (15 mg total) by mouth every 4 (four) hours as needed for pain. 10/17/14   Radene Gunning, NP  sulfamethoxazole-trimethoprim (BACTRIM DS,SEPTRA DS) 800-160 MG tablet Take 1 tablet by mouth 2 (two) times daily. 04/11/15   Rolland Porter, MD    Family History Family History  Problem Relation Age of Onset  . Coronary artery disease Father   . Heart attack Father   . Hypertension Mother     Social History Social History  Substance Use Topics  . Smoking status: Never Smoker  . Smokeless tobacco: Current User    Types: Snuff     Comment: dip x 20  . Alcohol use Yes     Comment: very little 4-5 beers/yr     Allergies   Levaquin [levofloxacin]   Review of Systems Review of Systems  Constitutional: Positive for activity change, appetite change and fatigue. Negative for chills and fever.  HENT: Negative for congestion and rhinorrhea.   Respiratory: Negative for cough, chest tightness and shortness of breath.   Cardiovascular: Negative for chest pain.  Gastrointestinal: Positive for nausea and vomiting. Negative for abdominal pain.  Genitourinary: Negative for dysuria, hematuria and testicular pain.  Musculoskeletal: Positive for back pain. Negative for neck pain.  Neurological: Negative for dizziness, weakness, light-headedness and headaches.  A complete 10 system review of systems was obtained and all systems are negative except as noted in the HPI and PMH.     Physical Exam Updated Vital Signs BP (!) 167/109 (BP Location: Left Arm)   Pulse 95   Temp 97.7 F (36.5 C)  (Oral)   Resp 20   Ht 6\' 1"  (1.854 m)   Wt 210 lb (95.3 kg)   SpO2 100%   BMI 27.71 kg/m   Physical Exam  Constitutional: He is oriented to person, place, and time. He appears well-developed and well-nourished. No distress.  Pale appearing Dry mucus membranes  HENT:  Head: Normocephalic and atraumatic.  Mouth/Throat: Oropharynx is clear and moist. No oropharyngeal exudate.  Eyes: Conjunctivae and EOM are normal. Pupils are equal, round, and reactive to light.  Neck: Normal range of motion. Neck supple.  No meningismus.  Cardiovascular: Normal rate, regular rhythm, normal heart sounds and intact distal pulses.   No murmur heard. Intact DP and PT pulses  Pulmonary/Chest: Effort  normal and breath sounds normal. No respiratory distress.  Abdominal: Soft. There is no tenderness. There is no rebound and no guarding.  Musculoskeletal: Normal range of motion. He exhibits tenderness. He exhibits no edema.  Diffuse lumbar tenderness  Neurological: He is alert and oriented to person, place, and time. No cranial nerve deficit. He exhibits normal muscle tone. Coordination normal.   5/5 strength throughout. CN 2-12 intact.Equal grip strength. Sensation intact.   Skin: Skin is warm.  Psychiatric: He has a normal mood and affect. His behavior is normal.  Nursing note and vitals reviewed.    ED Treatments / Results  Labs (all labs ordered are listed, but only abnormal results are displayed) Labs Reviewed  CBC WITH DIFFERENTIAL/PLATELET - Abnormal; Notable for the following:       Result Value   Hemoglobin 12.5 (*)    HCT 36.8 (*)    All other components within normal limits  COMPREHENSIVE METABOLIC PANEL - Abnormal; Notable for the following:    Chloride 98 (*)    Glucose, Bld 386 (*)    AST 13 (*)    ALT 12 (*)    All other components within normal limits  URINALYSIS, ROUTINE W REFLEX MICROSCOPIC (NOT AT [facility]) - Abnormal; Notable for the following:    Glucose, UA >1000 (*)    Hgb  urine dipstick TRACE (*)    Ketones, ur TRACE (*)    Protein, ur 30 (*)    All other components within normal limits  URINE MICROSCOPIC-ADD ON - Abnormal; Notable for the following:    Squamous Epithelial / LPF 0-5 (*)    All other components within normal limits  CBG MONITORING, ED - Abnormal; Notable for the following:    Glucose-Capillary 347 (*)    All other components within normal limits  CBG MONITORING, ED - Abnormal; Notable for the following:    Glucose-Capillary 265 (*)    All other components within normal limits  LIPASE, BLOOD  TROPONIN I    EKG  EKG Interpretation  Date/Time:  Tuesday March 26 2016 04:48:40 EDT Ventricular Rate:  89 PR Interval:    QRS Duration: 95 QT Interval:  364 QTC Calculation: 443 R Axis:   91 Text Interpretation:  Sinus rhythm Borderline right axis deviation No significant change was found Confirmed by Wyvonnia Dusky  MD, Mikeisha Lemonds [id number]) on 03/26/2016 4:53:55 AM       Radiology Dg Lumbar Spine Complete  Result Date: 03/26/2016 CLINICAL DATA:  Vomiting this morning, resulting in back pain. History of chronic back pain and back surgery. EXAM: LUMBAR SPINE - COMPLETE 4+ VIEW COMPARISON:  CT abdomen and pelvis May 20, 2015 FINDINGS: Lumbar vertebral bodies intact with maintenance of lumbar lordosis. Broad mid lumbar levoscoliosis. Status post L3-4 TLIF, intact hardware without periprosthetic lucency. Non surgically altered disc heights preserved. No destructive bony lesions. Chronic LEFT L5 pars interarticularis defect. Included prevertebral and paraspinal soft tissue planes are nonsuspicious. Surgical clips in the included right abdomen compatible with cholecystectomy. IMPRESSION: No acute fracture deformity or malalignment. Status post L3-4 interbody fusion without hardware failure. Electronically Signed   By: Elon Alas M.D.   On: 03/26/2016 06:25    Procedures Procedures (including critical care time)  Medications Ordered in  ED Medications  sodium chloride 0.9 % bolus 1,000 mL (not administered)  ondansetron (ZOFRAN) injection 4 mg (not administered)     Initial Impression / Assessment and Plan / ED Course  I have reviewed the triage vital signs and the nursing  notes.  Pertinent labs & imaging results that were available during my care of the patient were reviewed by me and considered in my medical decision making (see chart for details).  Clinical Course  12 hours of nausea and vomiting, stomach contents. No diarrhea. No abdominal pain or chest pain.  IVF, symptom control.  CBG 347  Labs show hyperglycemia without DKA.  Anion gap normal. Troponin negative. EKG unchanged.  CBG improved to 265 with IVF and insulin. No Chest pain or upper back pain. Lower back pain at baseline with unchanged hardware on Xray. No neuro deficits. No evidence of cord compression or cauda equina,  Nausea and vomiting have improved.  Tolerating PO.  CBG 265.  BP remains elevated, patient states due for AM meds and will take at home.  He feels much improved and is requesting discharge. Followup with PCP, return precautions discussed. Continue PPI, may benefit from GI followup. Reports negative gastric emptying study in the past.   Final Clinical Impressions(s) / ED Diagnoses   Final diagnoses:  Hyperglycemia  Non-intractable vomiting with nausea, unspecified vomiting type    New Prescriptions New Prescriptions   No medications on file     Ezequiel Essex, MD 03/26/16 [phone]

## 2018-01-11 ENCOUNTER — Other Ambulatory Visit: Payer: Self-pay

## 2018-01-11 ENCOUNTER — Emergency Department (HOSPITAL_COMMUNITY)
Admission: EM | Admit: 2018-01-11 | Discharge: 2018-01-11 | Disposition: A | Discharge status: Home / Self Care | Payer: 59 | Attending: Emergency Medicine | Admitting: Emergency Medicine

## 2018-01-11 ENCOUNTER — Encounter (HOSPITAL_COMMUNITY): Payer: Self-pay | Admitting: Emergency Medicine

## 2018-01-11 DIAGNOSIS — Z79899 Other long term (current) drug therapy: Secondary | ICD-10-CM | POA: Diagnosis not present

## 2018-01-11 DIAGNOSIS — M545 Low back pain: Secondary | ICD-10-CM | POA: Diagnosis present

## 2018-01-11 DIAGNOSIS — M5442 Lumbago with sciatica, left side: Secondary | ICD-10-CM | POA: Insufficient documentation

## 2018-01-11 DIAGNOSIS — G8929 Other chronic pain: Secondary | ICD-10-CM

## 2018-01-11 DIAGNOSIS — E119 Type 2 diabetes mellitus without complications: Secondary | ICD-10-CM | POA: Diagnosis not present

## 2018-01-11 DIAGNOSIS — I10 Essential (primary) hypertension: Secondary | ICD-10-CM | POA: Diagnosis not present

## 2018-01-11 DIAGNOSIS — F17220 Nicotine dependence, chewing tobacco, uncomplicated: Secondary | ICD-10-CM | POA: Diagnosis not present

## 2018-01-11 DIAGNOSIS — Z794 Long term (current) use of insulin: Secondary | ICD-10-CM | POA: Insufficient documentation

## 2018-01-11 DIAGNOSIS — M5441 Lumbago with sciatica, right side: Secondary | ICD-10-CM | POA: Diagnosis not present

## 2018-01-11 MED ORDER — METHOCARBAMOL 750 MG PO TABS: 750.0000 mg | ORAL_TABLET | Freq: Four times a day (QID) | ORAL | 0 refills | Status: AC

## 2018-01-11 MED ORDER — OXYCODONE-ACETAMINOPHEN 5-325 MG PO TABS: 1.0000 | ORAL_TABLET | ORAL | 0 refills | Status: AC | PRN

## 2018-01-11 MED ORDER — ONDANSETRON 4 MG PO TBDP
4.0000 mg | ORAL_TABLET | Freq: Once | ORAL | Status: AC
  Administered 2018-01-11: 4 mg via ORAL
  Filled 2018-01-11: qty 1

## 2018-01-11 MED ORDER — OXYCODONE-ACETAMINOPHEN 5-325 MG PO TABS
1.0000 | ORAL_TABLET | Freq: Once | ORAL | Status: AC
  Administered 2018-01-11: 1 via ORAL
  Filled 2018-01-11: qty 1

## 2018-01-11 MED ORDER — HYDROMORPHONE HCL 1 MG/ML IJ SOLN
1.0000 mg | Freq: Once | INTRAMUSCULAR | Status: AC
  Administered 2018-01-11: 1 mg via INTRAMUSCULAR
  Filled 2018-01-11: qty 1

## 2018-01-11 NOTE — ED Triage Notes (Signed)
Patient c/o lower back pain that radiates into legs bilaterally. Denies any injures. Per patient has hx of back problems. Denies any difficulties with urination or BMS. CNS intact. Denies any numbness. Patient took tylenol and ibuprofen with no relief (took both at 10:30am)

## 2018-01-11 NOTE — ED Provider Notes (Signed)
[facility] EMERGENCY DEPARTMENT Provider Note   CSN: [id number] Arrival date & time: 01/11/18  1610     History   Chief Complaint Chief Complaint  Patient presents with  . Back Pain    HPI Tuan Tippin is a 42 y.o. male presenting with acute on chronic low back pain which started early today, describing his chronic low back pain with radiation to his bilateral posterior lower thighs.   Patient denies any new injury specifically.  He has a history of lumbar surgeries by Dr. Carloyn Manner in the past and has been on disability due to his chronic pain.  He also has a history of kidney stones but denies dysuria, hematuria, flank pain and states his pain is c/w his chronic lumbar pain, but seems worse than normal today.  He does endorse being out of his chronic oxycodone 15mg  tablets since the beginning of the month, stating his pcp in [place] has ben unavailable due to illness.  There has been no weakness or numbness in the lower extremities and no urinary or bowel retention or incontinence.  Patient does not have a history of cancer or IVDU.  The patient has tried tylenol and rest without significant relief of symptoms. .  The history is provided by the patient.    Past Medical History:  Diagnosis Date  . Anxiety   . Cellulitis of face   . Cervical radiculopathy   . Chronic neck and back pain   . Chronic pain   . Depression   . Diabetes mellitus   . Diabetic foot ulcer ([place])   . Drug-seeking behavior   . ED (erectile dysfunction)   . GERD (gastroesophageal reflux disease)   . HTN (hypertension)   . Hyperlipidemia   . Lumbar radiculopathy     Patient Active Problem List   Diagnosis Date Noted  . Facial cellulitis   . Essential hypertension   . Esophageal reflux   . Type 2 diabetes mellitus with complication ([place])   . Bilateral low back pain without sciatica   . Pilonidal cyst 02/28/2014  . Osteomyelitis ([place]) 12/03/2013  . Acute osteomyelitis of foot ([place]) 10/06/2013  .  Diabetic osteomyelitis ([place]) 10/05/2013  . Diabetic foot ulcer ([place]) 09/29/2013  . Hyponatremia 09/29/2013  . Hyperglycemia 09/29/2013  . Non-healing ulcer ([place]) 06/26/2013  . Diabetic foot infection ([place]) 06/20/2013  . Essential hypertension, benign 09/18/2012  . Diabetes ([place]) 09/18/2012  . Chronic low back pain 09/18/2012  . Cellulitis 09/18/2012  . Rectal bleed 01/22/2011  . GERD (gastroesophageal reflux disease) 01/22/2011  . Vomiting 01/22/2011    Past Surgical History:  Procedure Laterality Date  . BACK SURGERY    . CHOLECYSTECTOMY  2009  . CHOLECYSTECTOMY OPEN    . KNEE SURGERY    . TONSILLECTOMY    . WOUND DEBRIDEMENT Left 10/05/2013   Procedure: INCISION AND DEBRIDEMENT LEFT FOOT;  Surgeon: Jamesetta So, MD;  Location: [facility];  Service: General;  Laterality: Left;        Home Medications    Prior to Admission medications   Medication Sig Start Date End Date Taking? Authorizing Provider  alprazolam Duanne Moron) 2 MG tablet Take 1 tablet (2 mg total) by mouth at bedtime as needed for sleep. 10/17/14   Black, Lezlie Octave, NP  amLODipine (NORVASC) 10 MG tablet Take 0.5 tablets (5 mg total) by mouth daily. 10/17/14   Black, Lezlie Octave, NP  cephALEXin (KEFLEX) 500 MG capsule Take 1 capsule (500 mg total) by mouth  3 (three) times daily. 04/11/15   Rolland Porter, MD  cyclobenzaprine (FLEXERIL) 5 MG tablet Take 1 tablet (5 mg total) by mouth 3 (three) times daily as needed for muscle spasms. Patient not taking: Reported on 10/19/2014 10/17/14   Radene Gunning, NP  doxycycline (VIBRA-TABS) 100 MG tablet Take 1 tablet (100 mg total) by mouth every 12 (twelve) hours. 10/17/14   Black, Lezlie Octave, NP  escitalopram (LEXAPRO) 20 MG tablet Take 1 tablet (20 mg total) by mouth daily. 10/17/14   Black, Lezlie Octave, NP  esomeprazole (NEXIUM) 40 MG capsule Take 1 capsule (40 mg total) by mouth daily before breakfast. 03/30/13   Julianne Rice, MD  insulin aspart (NOVOLOG) 100 UNIT/ML FlexPen Inject 5 Units into  the skin 3 (three) times daily with meals. 09/15/14   Kathie Dike, MD  insulin glargine (LANTUS) 100 UNIT/ML injection Inject 0.6 mLs (60 Units total) into the skin daily before supper. 10/17/14   Black, Lezlie Octave, NP  lisinopril (PRINIVIL,ZESTRIL) 20 MG tablet Take 1 tablet (20 mg total) by mouth daily. 10/17/14   Black, Lezlie Octave, NP  naproxen (NAPROSYN) 500 MG tablet Take 1 po BID with food prn pain 04/11/15   Rolland Porter, MD  ondansetron (ZOFRAN) 4 MG tablet Take 1 tablet (4 mg total) by mouth every 6 (six) hours. 03/26/16   Rancour, Annie Main, MD  oxyCODONE (ROXICODONE) 15 MG immediate release tablet Take 1 tablet (15 mg total) by mouth every 4 (four) hours as needed for pain. 10/17/14   Black, Lezlie Octave, NP  sitaGLIPtin (JANUVIA) 100 MG tablet Take 300 mg by mouth daily.    [provider]  sulfamethoxazole-trimethoprim (BACTRIM DS,SEPTRA DS) 800-160 MG tablet Take 1 tablet by mouth 2 (two) times daily. 04/11/15   Rolland Porter, MD    Family History Family History  Problem Relation Age of Onset  . Coronary artery disease Father   . Heart attack Father   . Hypertension Mother     Social History Social History   Tobacco Use  . Smoking status: Never Smoker  . Smokeless tobacco: Current User    Types: Snuff  . Tobacco comment: dip x 20  Substance Use Topics  . Alcohol use: Yes    Comment: very little 4-5 beers/yr  . Drug use: No     Allergies   Levaquin [levofloxacin]   Review of Systems Review of Systems  Constitutional: Negative for fever.  Respiratory: Negative for shortness of breath.   Cardiovascular: Negative for chest pain and leg swelling.  Gastrointestinal: Negative for abdominal distention, abdominal pain and constipation.  Genitourinary: Negative for difficulty urinating, dysuria, flank pain, frequency and urgency.  Musculoskeletal: Positive for back pain. Negative for gait problem and joint swelling.  Skin: Negative for rash.  Neurological: Negative for  weakness and numbness.     Physical Exam Updated Vital Signs BP (!) 159/93   Pulse (!) 112   Temp 98.6 F (37 C) (Oral)   Resp 18   Ht 6\' 1"  (1.854 m)   Wt 102.1 kg (225 lb)   SpO2 100%   BMI 29.69 kg/m   Physical Exam  Constitutional: He appears well-developed and well-nourished.  HENT:  Head: Normocephalic.  Eyes: Conjunctivae are normal.  Neck: Normal range of motion. Neck supple.  Cardiovascular: Normal rate and intact distal pulses.  Pedal pulses normal.  Pulmonary/Chest: Effort normal.  Abdominal: Soft. Bowel sounds are normal. He exhibits no distension and no mass.  Musculoskeletal: Normal range of motion. He exhibits  no edema.       Lumbar back: He exhibits tenderness. He exhibits no swelling, no edema and no spasm.  Neurological: He is alert. He has normal strength. He displays no atrophy and no tremor. No sensory deficit. Gait normal.  Reflex Scores:      Patellar reflexes are 2+ on the right side and 2+ on the left side.      Achilles reflexes are 2+ on the right side and 2+ on the left side. No strength deficit noted in hip and knee flexor and extensor muscle groups.  Ankle flexion and extension intact.  Skin: Skin is warm and dry.  Psychiatric: He has a normal mood and affect.  Nursing note and vitals reviewed.    ED Treatments / Results  Labs (all labs ordered are listed, but only abnormal results are displayed) Labs Reviewed - No data to display  EKG None  Radiology No results found.  Procedures Procedures (including critical care time)  Medications Ordered in ED Medications  HYDROmorphone (DILAUDID) injection 1 mg (1 mg Intramuscular Given 01/11/18 1807)  ondansetron (ZOFRAN-ODT) disintegrating tablet 4 mg (4 mg Oral Given 01/11/18 1807)     Initial Impression / Assessment and Plan / ED Course  I have reviewed the triage vital signs and the nursing notes.  Pertinent labs & imaging results that were available during my care of the patient  were reviewed by me and considered in my medical decision making (see chart for details).     No neuro deficit on exam or by history to suggest emergent or surgical presentation.  Also discussed worsened sx that should prompt immediate re-evaluation including distal weakness, bowel/bladder retention/incontinence.  Patient was given an IM injection of Dilaudid and he had significant them improvement in his pain with this medication.  Prior to discharge he describes having intermittent muscle spasms which started in his lower back and radiate through his upper thighs which also started earlier this morning.  He describes spasms that will last for 10 to 15 seconds then resolved.  He will be prescribed Robaxin as well as a small quantity of oxycodone.  [facility] [place] and [place] were reviewed.  He is obtaining chronic pain medication oxycodone by his PCP in [place], his last prescription was obtained June 1.  Patient endorses he has an appointment with his PCP in 3 days.  I will bridge him with a small quantity of oxycodone until he can be seen by his primary doctor.        Final Clinical Impressions(s) / ED Diagnoses   Final diagnoses:  None    ED Discharge Orders    None       Landis Martins 01/11/18 1909    Margette Fast, MD 01/12/18 1017

## 2018-01-11 NOTE — Discharge Instructions (Addendum)
Do not drive within 4 hours of taking oxycodone as this will make you drowsy.  Avoid lifting,  Bending,  Twisting or any other activity that worsens your pain over the next week.  Keep your appointment with your doctor on Wednesday as planned.

## 2019-01-02 ENCOUNTER — Inpatient Hospital Stay: Admission: EM | Admit: 2019-01-02 | Payer: 59 | Source: Other Acute Inpatient Hospital | Admitting: Neurology

## 2020-09-22 DEATH — deceased
